# Patient Record
Sex: Female | Born: 1935 | Race: Black or African American | Hispanic: No | Smoking: Former smoker
Health system: Southern US, Academic
[De-identification: ages and names within clinical notes are randomized; demographics above are authoritative.]

## PROBLEM LIST (undated history)

## (undated) DIAGNOSIS — E119 Type 2 diabetes mellitus without complications: Secondary | ICD-10-CM

## (undated) DIAGNOSIS — Z973 Presence of spectacles and contact lenses: Secondary | ICD-10-CM

## (undated) DIAGNOSIS — I10 Essential (primary) hypertension: Secondary | ICD-10-CM

## (undated) DIAGNOSIS — E785 Hyperlipidemia, unspecified: Secondary | ICD-10-CM

## (undated) DIAGNOSIS — I2510 Atherosclerotic heart disease of native coronary artery without angina pectoris: Secondary | ICD-10-CM

## (undated) DIAGNOSIS — B1920 Unspecified viral hepatitis C without hepatic coma: Secondary | ICD-10-CM

## (undated) DIAGNOSIS — N189 Chronic kidney disease, unspecified: Secondary | ICD-10-CM

## (undated) DIAGNOSIS — Z972 Presence of dental prosthetic device (complete) (partial): Secondary | ICD-10-CM

## (undated) HISTORY — PX: HX APPENDECTOMY: SHX54

## (undated) HISTORY — DX: Hyperlipidemia, unspecified: E78.5

## (undated) HISTORY — PX: HX HYSTERECTOMY: SHX81

## (undated) HISTORY — DX: Essential (primary) hypertension: I10

## (undated) HISTORY — DX: Type 2 diabetes mellitus without complications: E11.9

## (undated) HISTORY — DX: Unspecified viral hepatitis C without hepatic coma: B19.20

## (undated) HISTORY — DX: Chronic kidney disease, unspecified: N18.9

## (undated) HISTORY — DX: Atherosclerotic heart disease of native coronary artery without angina pectoris: I25.10

---

## 1993-08-20 ENCOUNTER — Other Ambulatory Visit (HOSPITAL_COMMUNITY): Payer: Self-pay | Admitting: Emergency Medicine

## 2014-11-20 HISTORY — PX: CARDIAC CATHETERIZATION: SHX172

## 2019-10-04 IMAGING — MR MRI LUMBAR SPINE WITHOUT CONTRAST
7 series · 43 of 48 positions shown · IV contrast (gadolinium)
Comparison: None available.

EXAM:  MRI LUMBAR SPINE WITHOUT CONTRAST
INDICATION: New onset lower back pain with right lower extremity radiculopathy.
TECHNIQUE: Multiplanar multisequential MRI of the lumbar spine was performed without gadolinium contrast.

[Series 8: T2 · sagittal · 4.0mm · 1.01mm/px · 4 of 13 slices shown (1 of 4)]
[im 1/13]
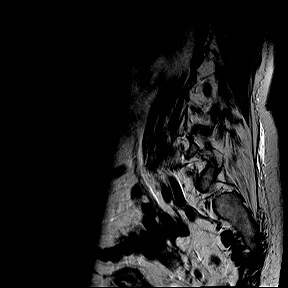
[im 5/13]
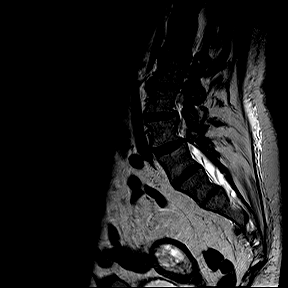
[im 9/13]
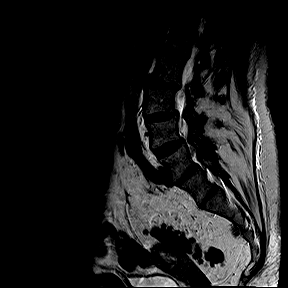
[im 13/13]
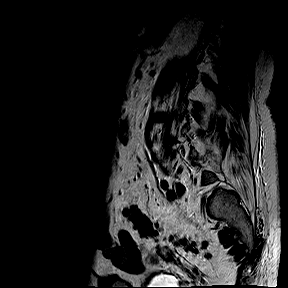

[Series 9: T1 · sagittal · 4.0mm · 1.01mm/px · 5 of 13 slices shown (1 of 2)]
[im 1/13]
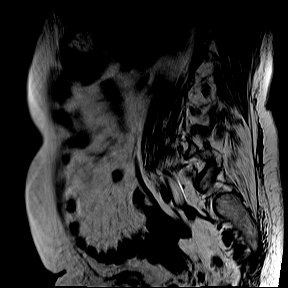
[im 4/13]
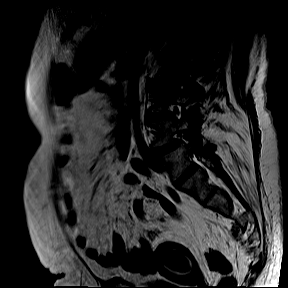
[im 7/13]
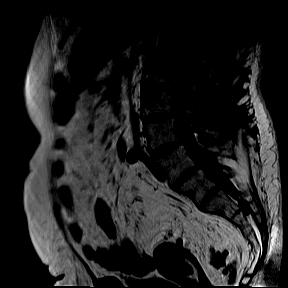
[im 10/13]
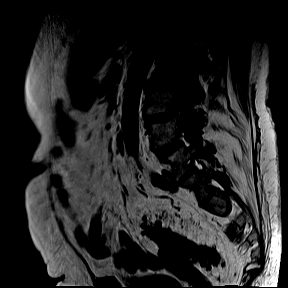
[im 13/13]
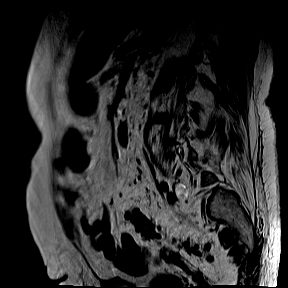

[Series 11: T2 · oblique · 4.0mm · 0.47mm/px · 8 of 26 slices shown (2 of 4)]
[im 1/26]
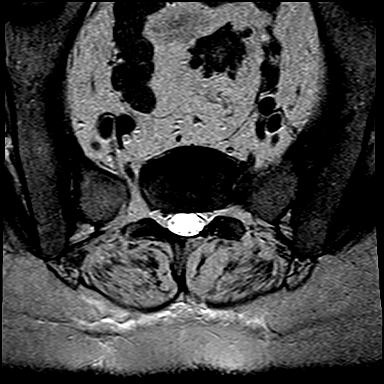
[im 3/26]
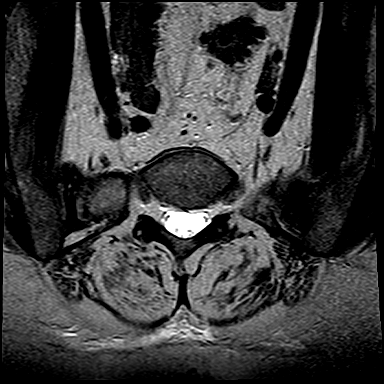
[im 9/26]
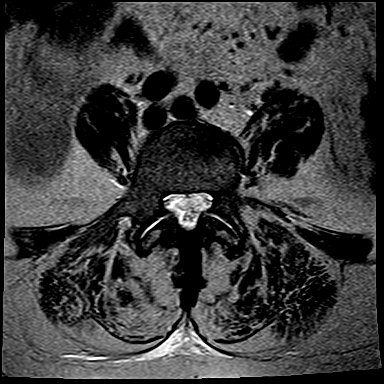
[im 12/26]
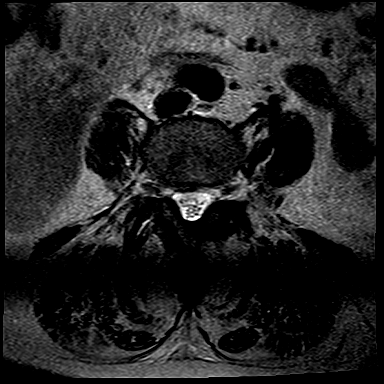
[im 14/26]
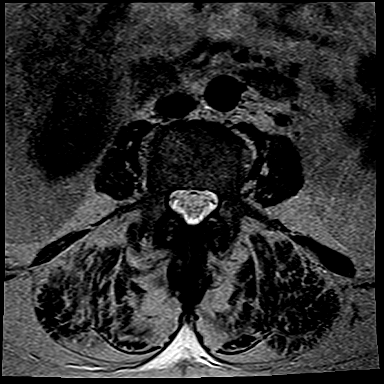
[im 17/26]
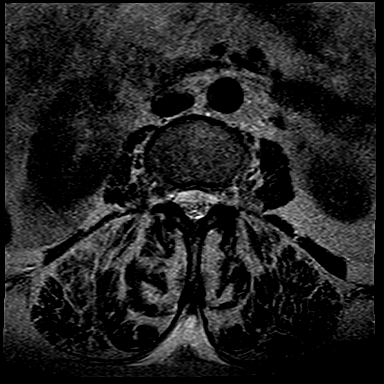
[im 23/26]
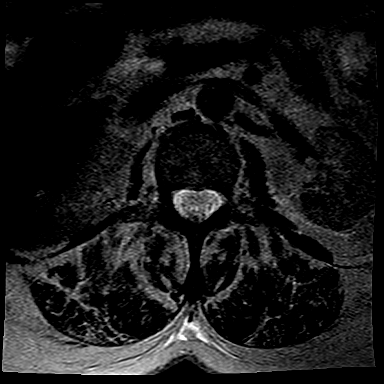
[im 26/26]
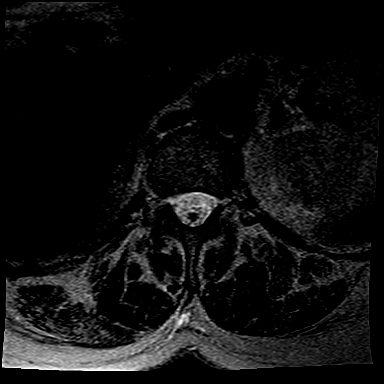

[Series 12: T1 · oblique · 4.0mm · 0.47mm/px · 8 of 26 slices shown (2 of 2)]
[im 1/26]
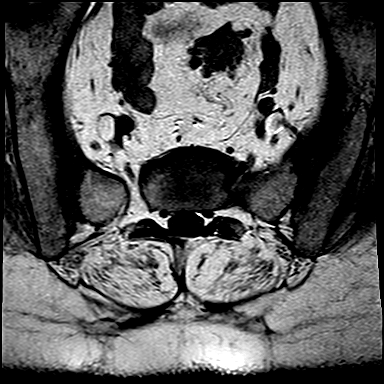
[im 3/26]
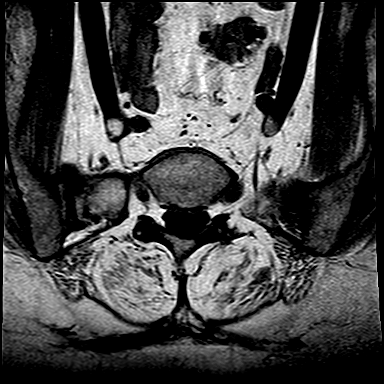
[im 9/26]
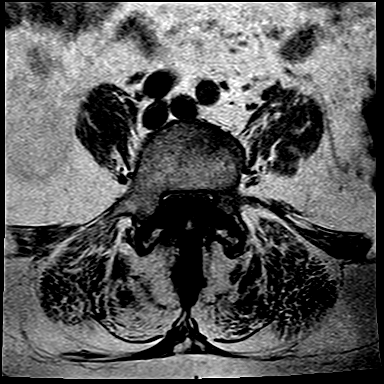
[im 12/26]
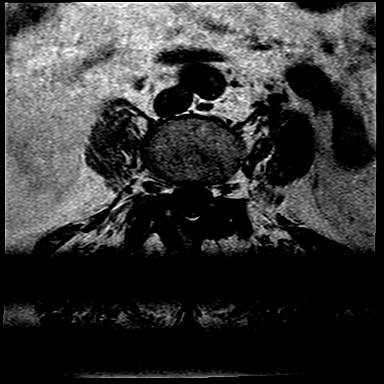
[im 14/26]
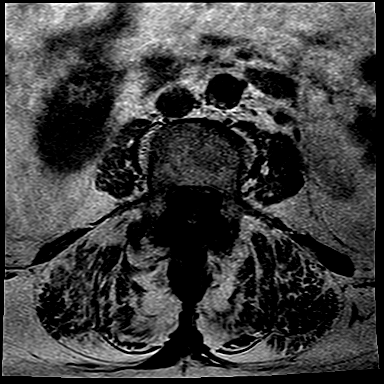
[im 17/26]
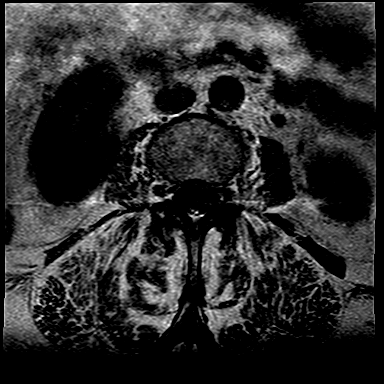
[im 23/26]
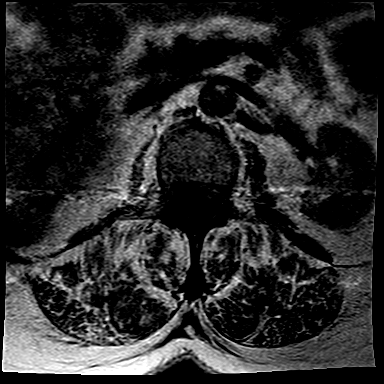
[im 26/26]
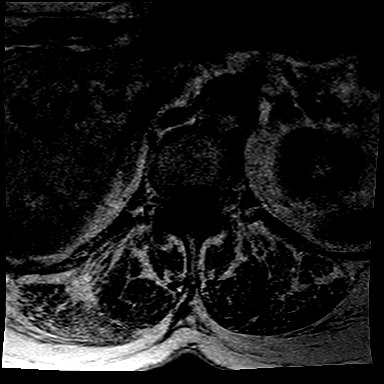

[Series 13: STIR · sagittal · 4.0mm · 1.13mm/px · 4 of 13 slices shown]
[im 1/13]
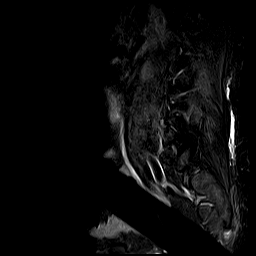
[im 4/13]
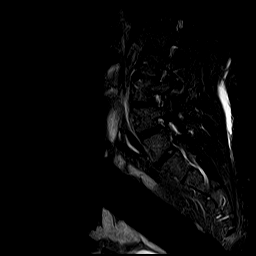
[im 7/13]
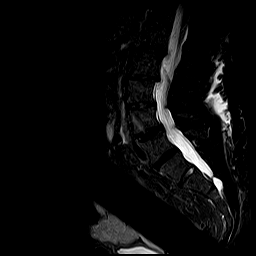
[im 10/13]
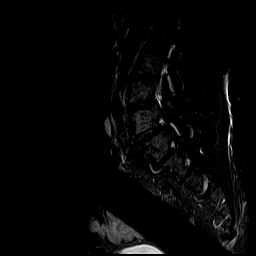

[Series 14: T2 · coronal · 4.0mm · 1.18mm/px · 7 of 20 slices shown (3 of 4)]
[im 1/20]
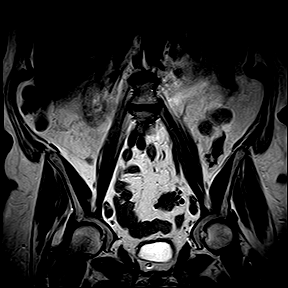
[im 4/20]
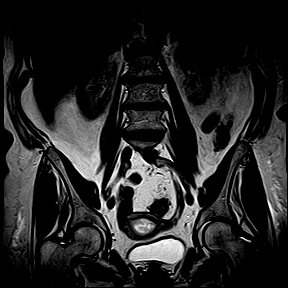
[im 7/20]
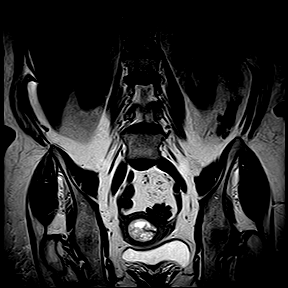
[im 10/20]
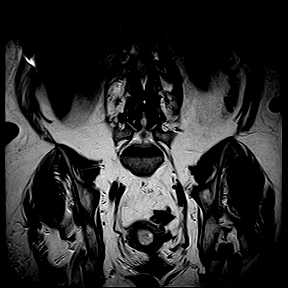
[im 13/20]
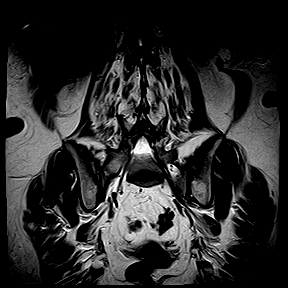
[im 16/20]
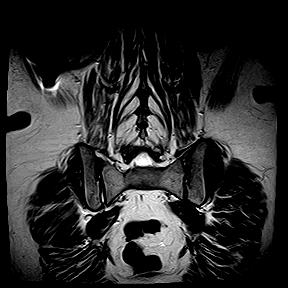
[im 20/20]
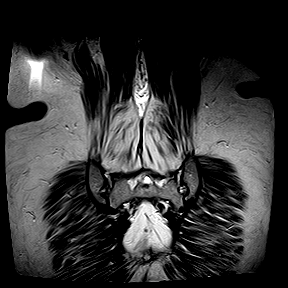

[Series 15: T2 · oblique · 4.0mm · 0.87mm/px · 7 of 20 slices shown (4 of 4)]
[im 1/20]
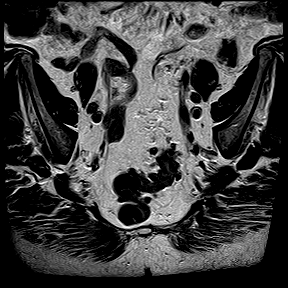
[im 4/20]
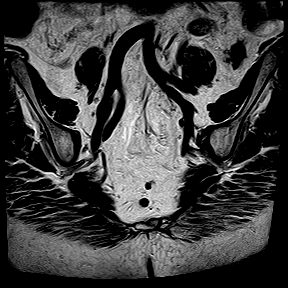
[im 7/20]
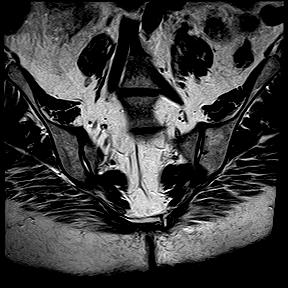
[im 10/20]
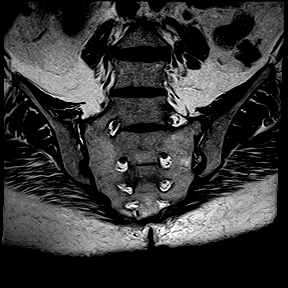
[im 13/20]
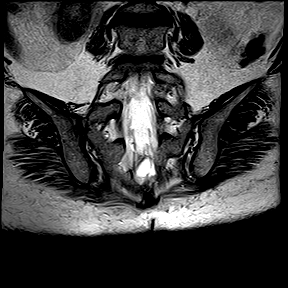
[im 16/20]
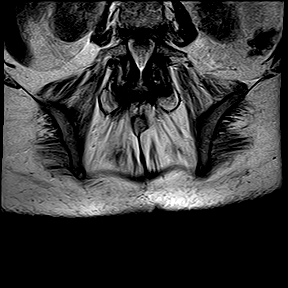
[im 20/20]
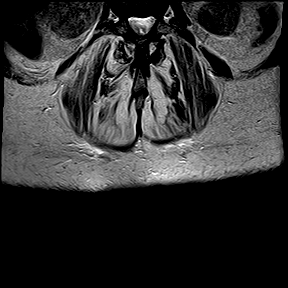

[43 of 48 positions shown; findings below may reference images not displayed]

FINDINGS: There is a transitional lumbosacral junction and the last complete intervertebral disc has been labeled S1-S2 for the purpose of this exam. Bone marrow signal intensity is normal. There is no acute fracture or subluxation. Distal spinal cord is normal in signal intensity and terminates normally at L1-2 disc space level. Spinal canal is congenitally narrow.

At L1-2 level, there is minimal retrolisthesis of L1 on L2 vertebral body. There is a small broad-based central and right paracentral disc bulge, mildly effacing the ventral thecal sac. There is mild right neural foraminal stenosis from facet arthropathy and bulging annulus.

At L2-3 level, there is minimal retrolisthesis of L2 on L3 vertebral body. There is a small broad-based central disc bulge, mildly effacing the ventral thecal sac. There is mild left and moderate right neural foraminal stenosis from facet arthropathy and bulging annulus.

At L3-4 level, there is minimal anterolisthesis of L3 on L4 vertebral body. There is a small broad-based central disc bulge resulting in mild-to-moderate spinal stenosis. There is mild-to-moderate bilateral neural foraminal stenosis from facet arthropathy and bulging annulus.

At L4-5 level, there is minimal anterolisthesis of L4 on L5 vertebral body. There is a small broad-based central disc bulge, mildly effacing the ventral thecal sac. There is mild left and mild-to-moderate right neural foraminal stenosis from facet arthropathy and bulging annulus.

At L5-S1 level, there is mild-to-moderate bilateral neural foraminal stenosis from facet arthropathy.

S1-2 level and paraspinal soft tissues are unremarkable.
IMPRESSION: Transitional lumbosacral junction and the last complete intervertebral disc has been labeled S1-S2 for the purpose of this exam. 

Minimal retrolisthesis of L1 on L2 and L2 on L3 vertebral body. There is also minimal anterolisthesis of L3 on L4 and L4 on L5 vertebral body. 

Small disc bulges at multiple levels with mild-to-moderate spinal stenosis at L3-4 level. 

Multilevel neural foraminal stenosis as detailed above.

## 2019-10-04 IMAGING — US US PELVIC COMPLETE
1 series · 14 of 25 positions shown · non-contrast
Comparison: None available.

EXAM:  US PELVIC COMPLETE
INDICATION: Follow-up endometrial thickening.

[Series 5: us pelvic complete · 0.33mm/px · 14 of 70 slices shown]
[im 1/70]
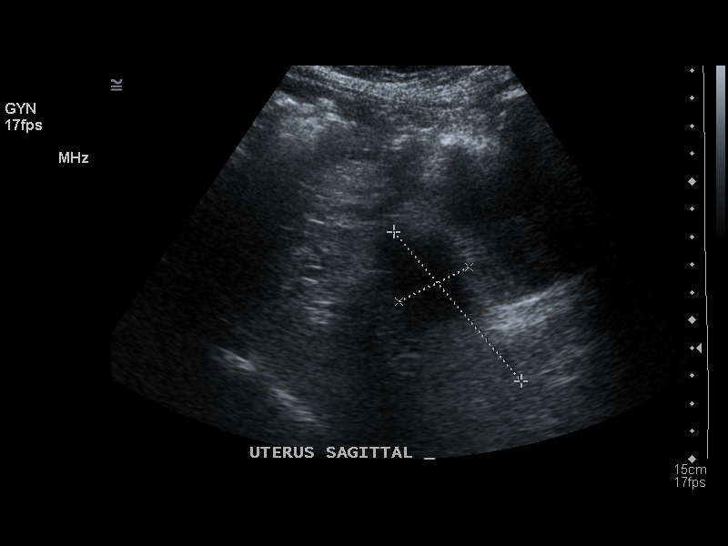
[im 6/70]
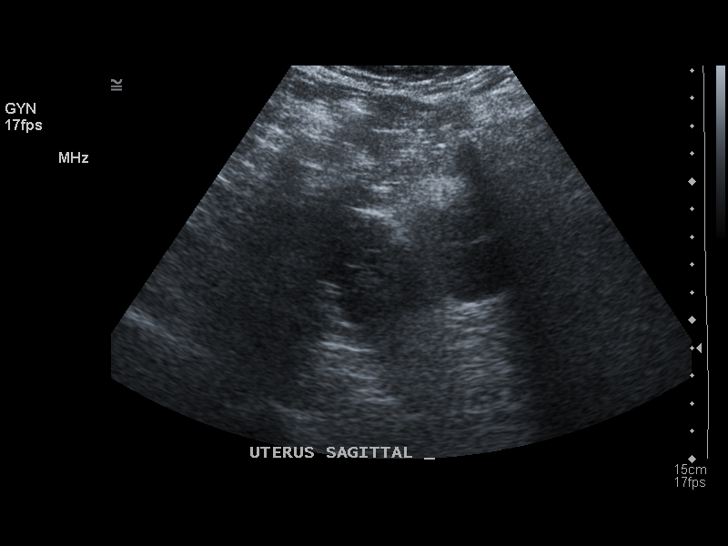
[im 12/70]
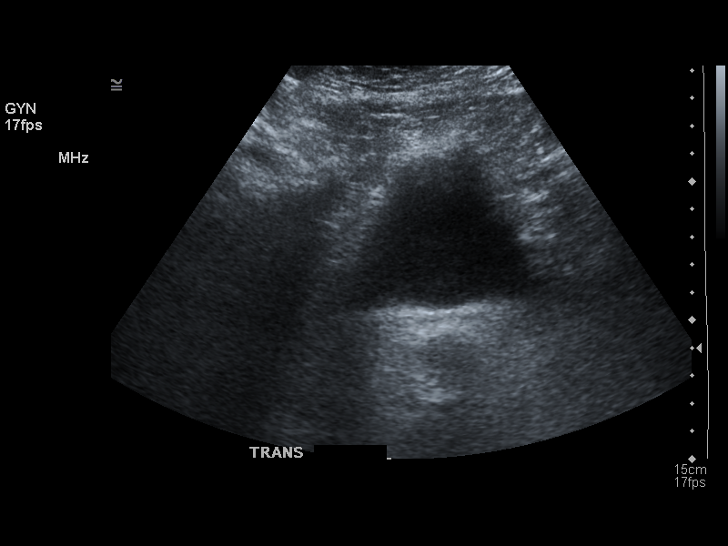
[im 18/70]
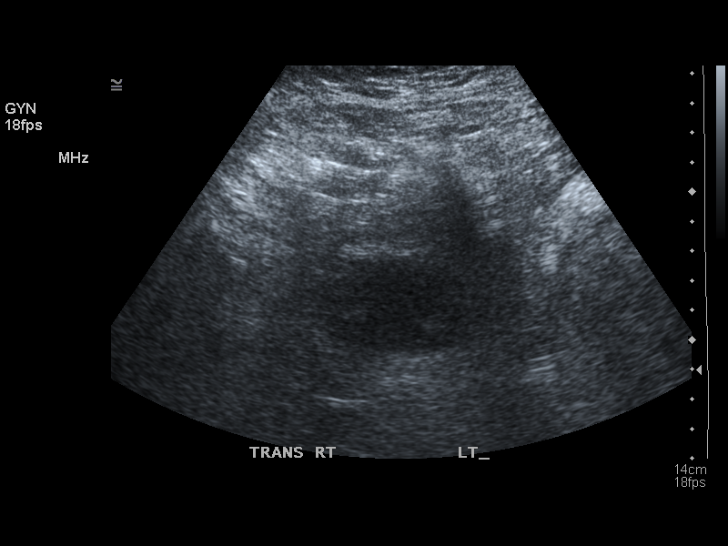
[im 24/70]
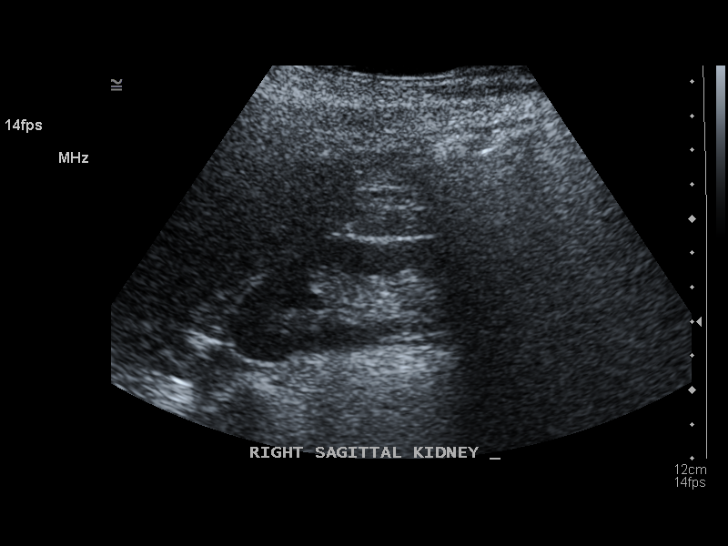
[im 26/70]
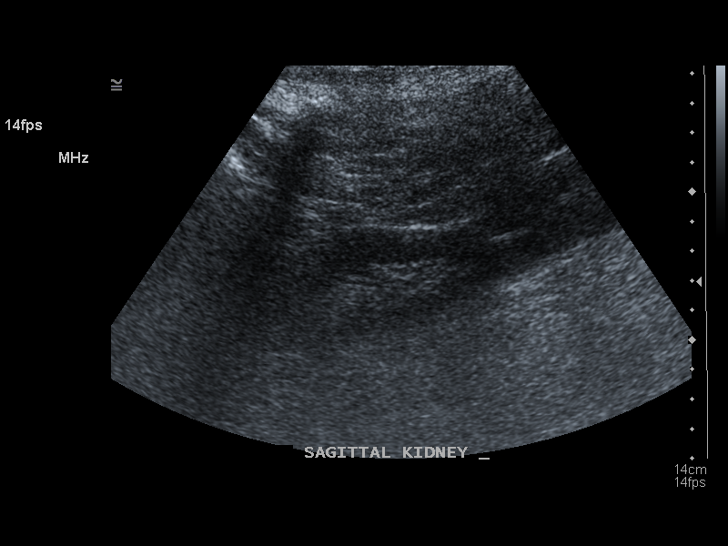
[im 32/70]
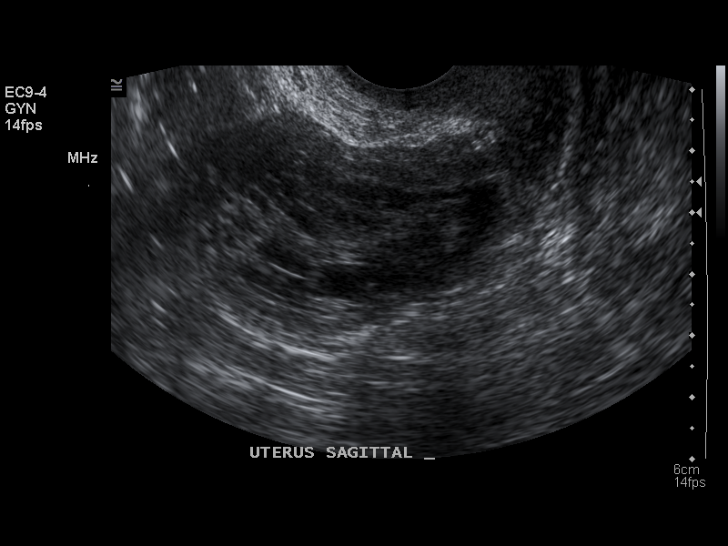
[im 38/70]
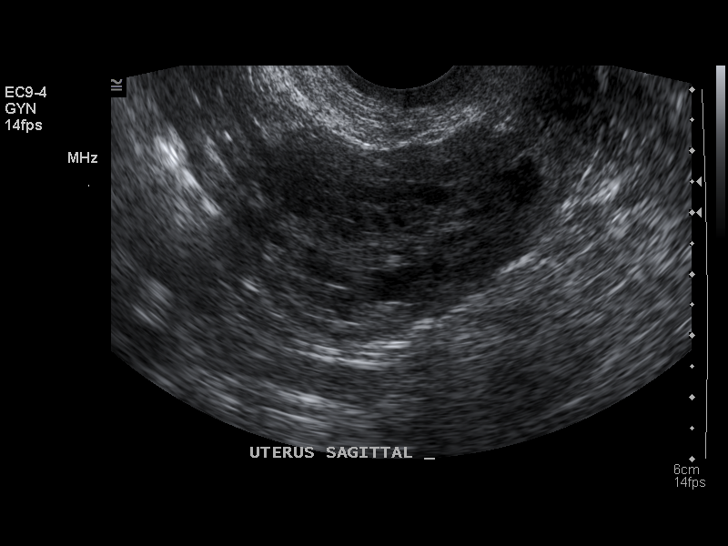
[im 44/70]
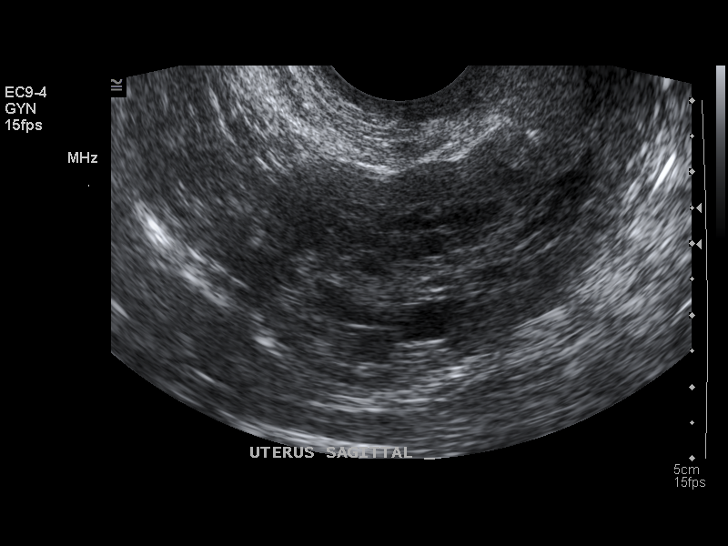
[im 47/70]
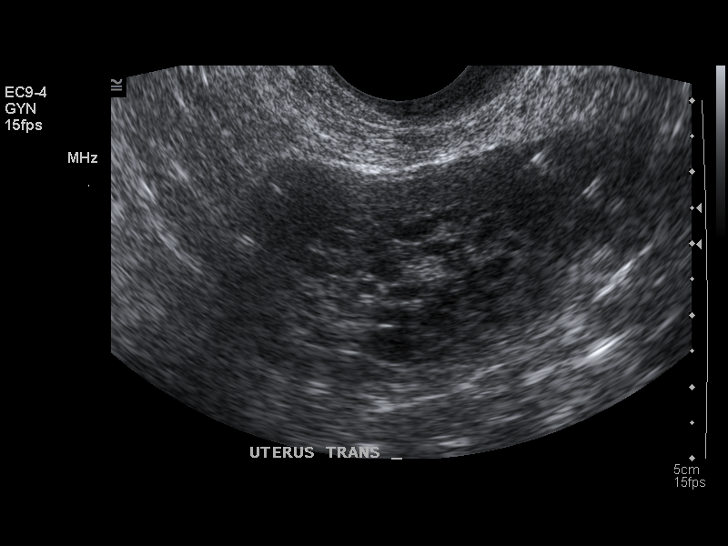
[im 52/70]
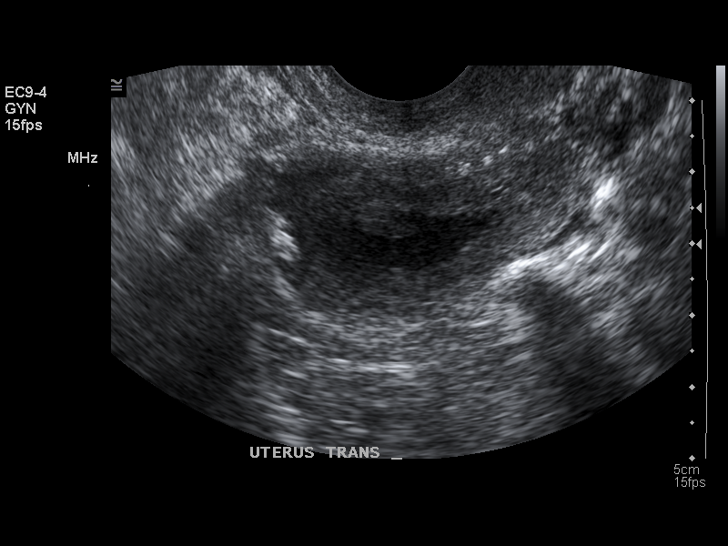
[im 58/70]
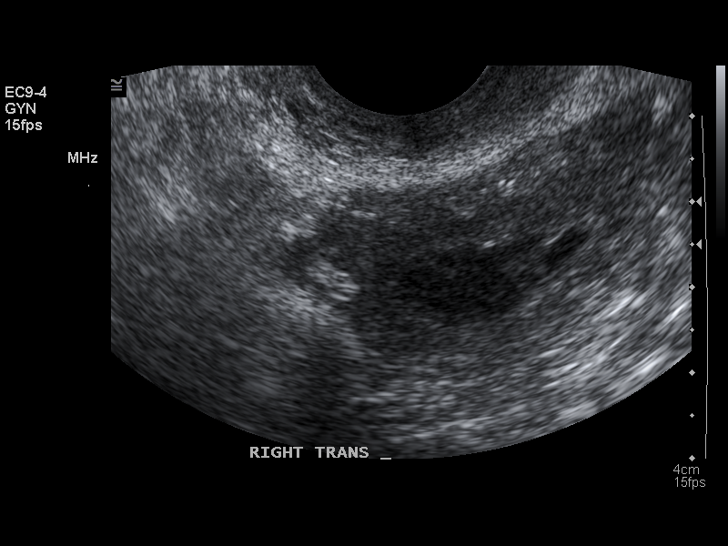
[im 64/70]
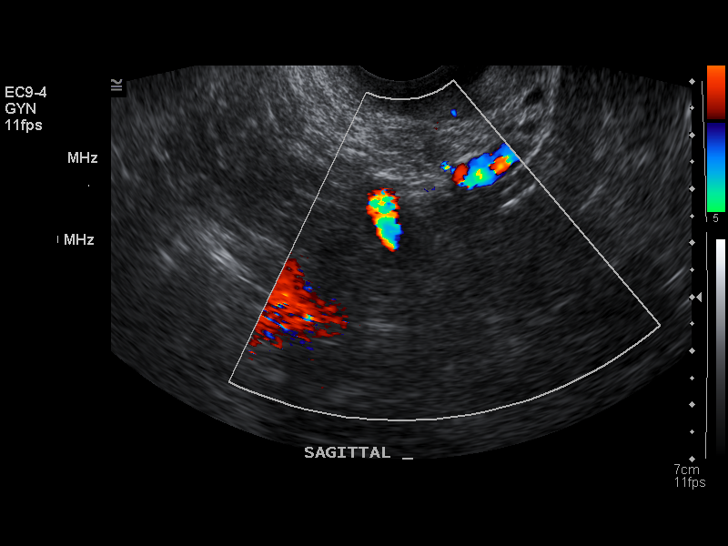
[im 70/70]
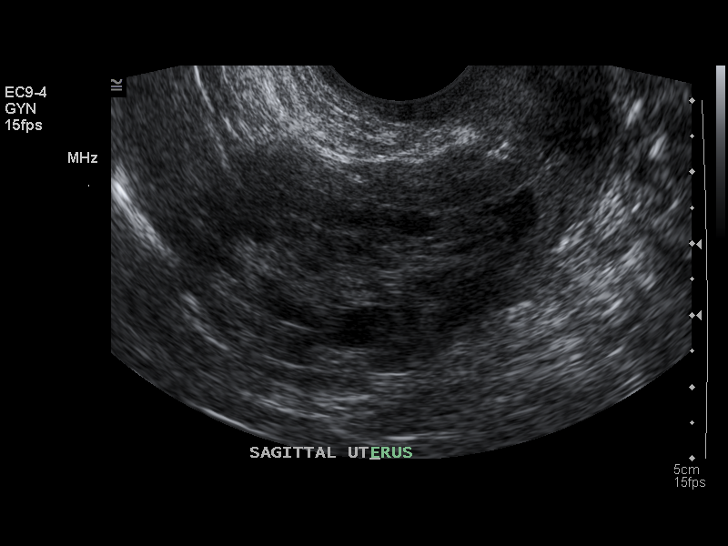

[14 of 25 positions shown; findings below may reference images not displayed]

FINDINGS: Sonographic evaluation the pelvis was performed transabdominally through a partially distended urinary bladder and transvaginally.

Uterus is heterogeneous in echogenicity and measures 6.5 x 3.4 x 4.5 cm. There is significant abnormal thickening of the endometrium with indistinct margins and underlying cystic changes, measuring 20 mm.

Ovaries are not well seen. There is no pelvic free fluid or adnexal mass.
IMPRESSION: Significantly abnormal endometrium as detailed above. This is highly suspicious for malignancy and follow-up GYN consult for tissue sampling is recommended.

## 2020-02-22 IMAGING — CR XRAY HAND 2 VIEWS RT
1 series · 3 of 3 positions shown · non-contrast
Comparison: None.

﻿EXAM:  XRAY HAND 2 VIEWS RIGHT

EXAM:  XRAY WRIST 2 VIEWS RIGHT
INDICATION: 84-year-old female with swelling of right hand and wrist.  No trauma.

[Series 1: view not recorded · 0.17mm/px · 3 of 3 slices shown]
[im 1/3]
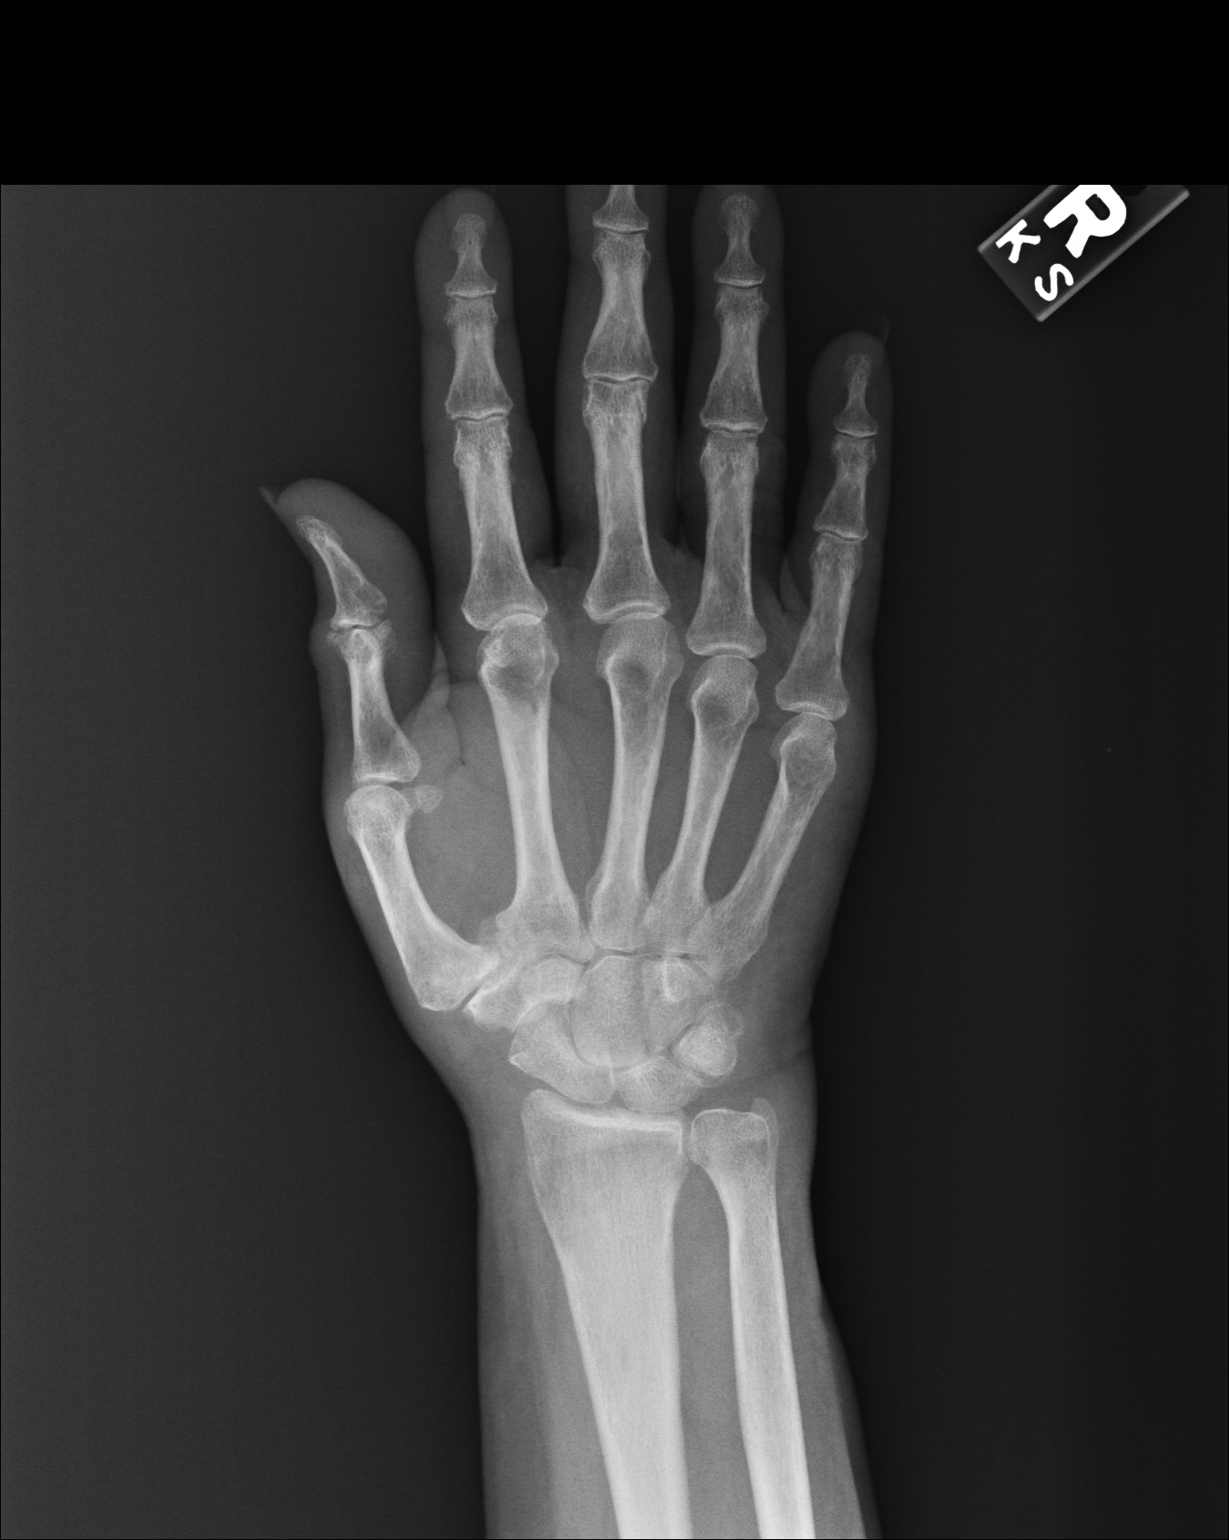
[im 2/3]
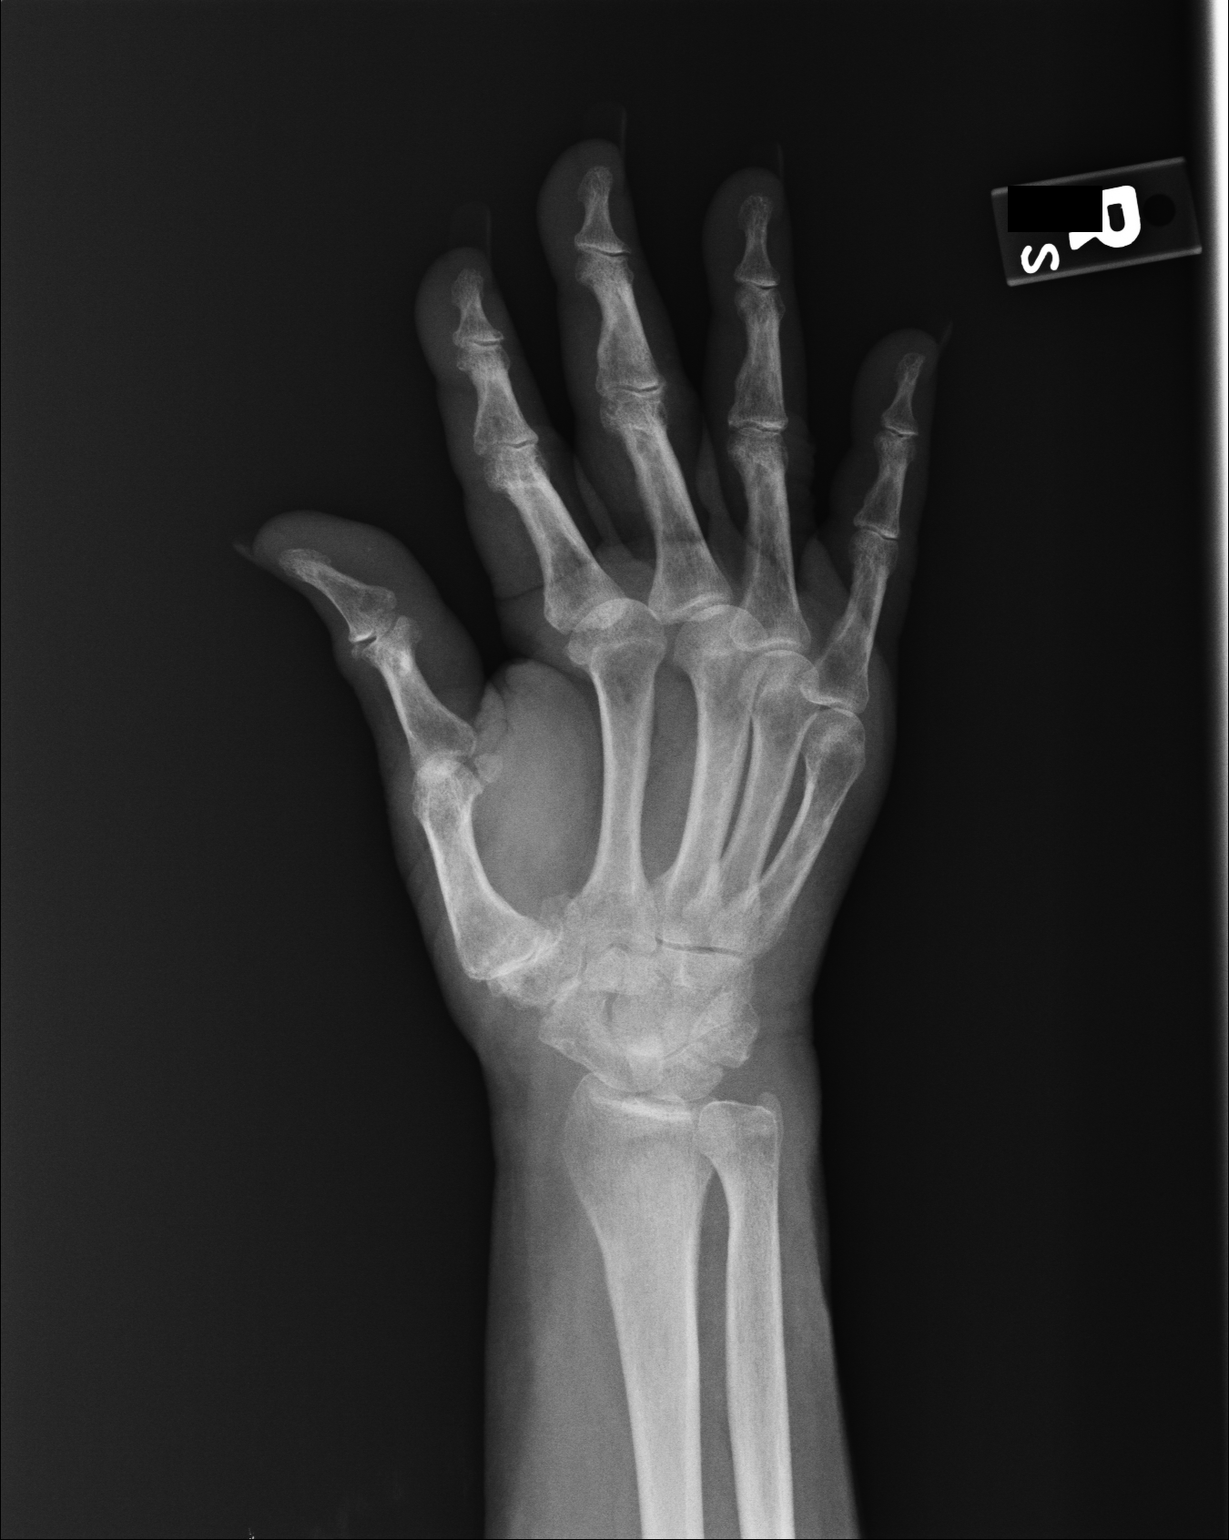
[im 3/3]
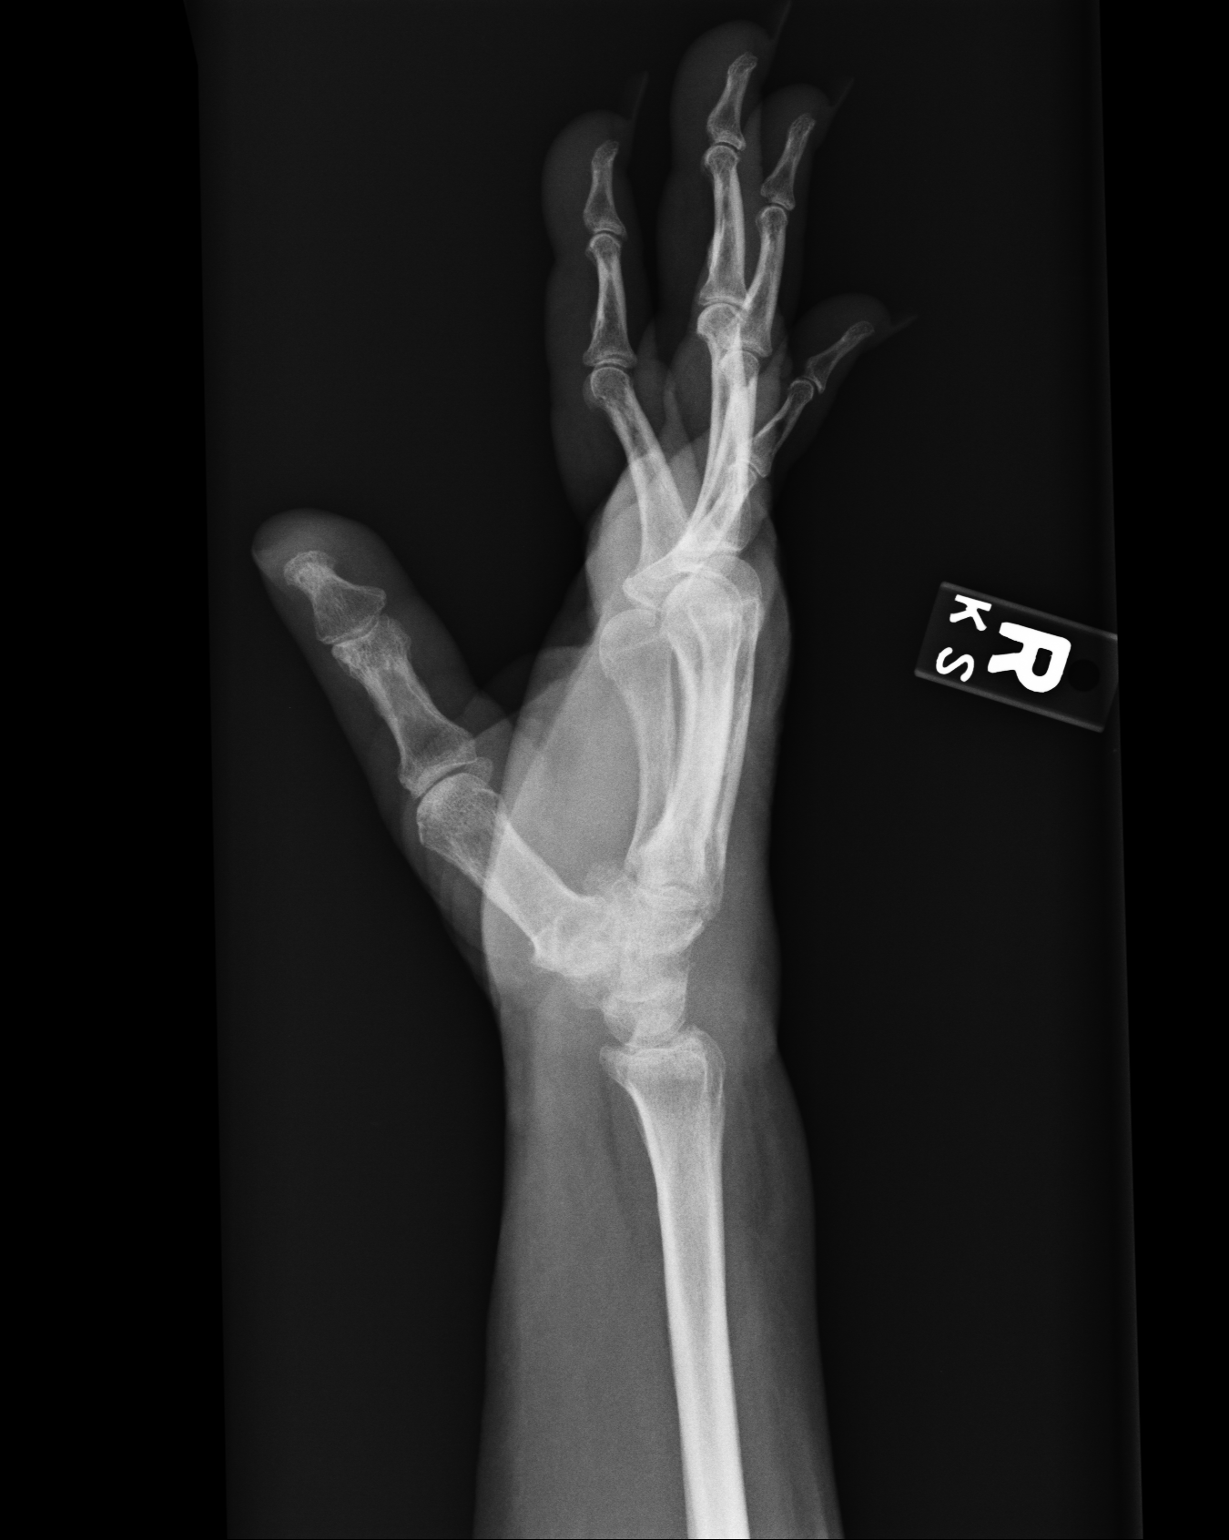

[3 of 3 positions shown; findings below may reference images not displayed]

FINDINGS: Bone density is normal.  No erosive changes at the radiocarpal joint and intercarpal joints.  Moderate degenerative changes at the first carpometacarpal joint. 

Metacarpophalangeal joints are normal.  Mild osteoarthritis of interphalangeal joints.  Soft tissue swelling noted at the wrist.
IMPRESSION: 1. No specific radiographic findings of rheumatoid arthritis.  Normal wrist joint except for soft tissue swelling on the dorsum of the wrist.

2. Osteoarthritis of interphalangeal joints and degenerative changes of the first carpometacarpal joint.

## 2021-08-12 IMAGING — MR MRI CERVICAL SPINE WITHOUT CONTRAST
4 of 5 series · 23 of 48 positions shown · IV contrast (gadolinium)
Comparison: None available.

﻿EXAM:  [id number]   MRI CERVICAL SPINE WITHOUT CONTRAST
INDICATION: Right-sided neck pain.
TECHNIQUE: Multiplanar multisequential MRI of the cervical spine was performed without gadolinium contrast.

[Series 5: T2 · sagittal · 3.0mm · 0.75mm/px · 8 of 15 slices shown (1 of 2)]
[im 1/15]
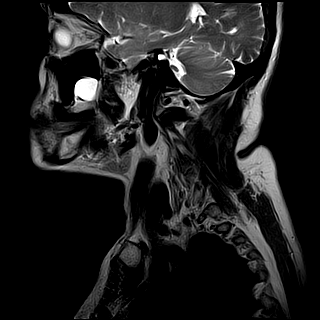
[im 3/15]
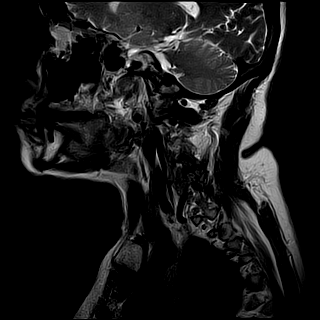
[im 5/15]
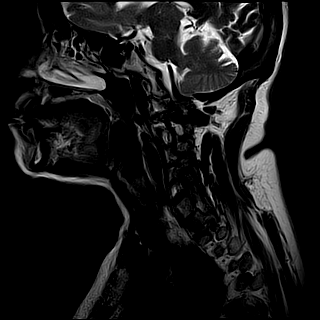
[im 7/15]
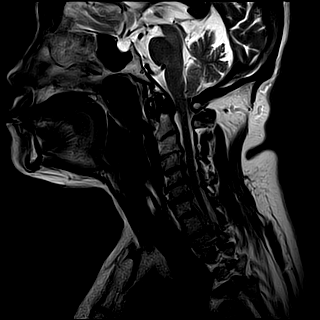
[im 9/15]
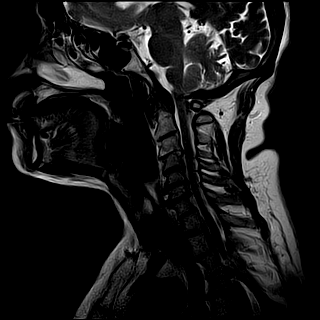
[im 11/15]
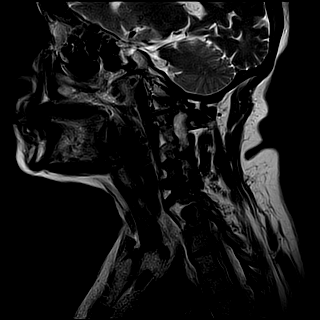
[im 13/15]
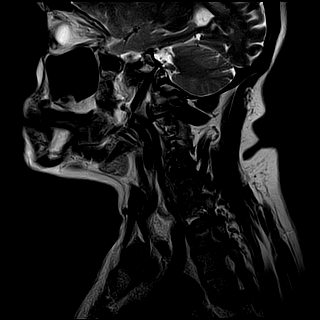
[im 15/15]
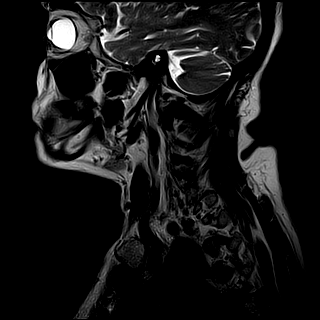

[Series 6: T1 · sagittal · 3.0mm · 0.47mm/px · 3 of 15 slices shown]
[im 2/15]
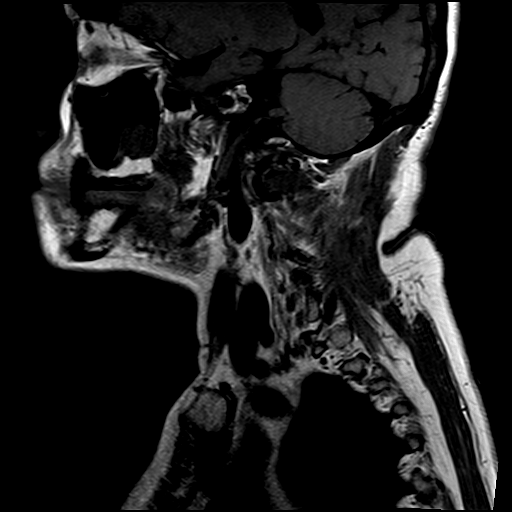
[im 8/15]
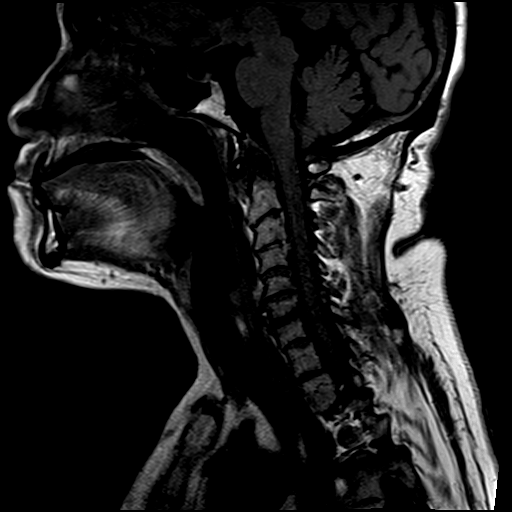
[im 13/15]
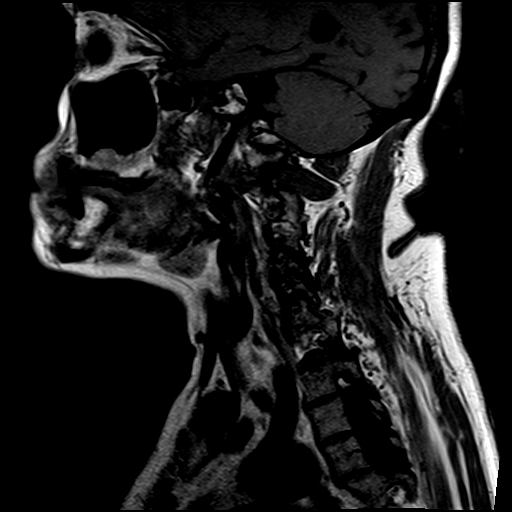

[Series 7: STIR · sagittal · 3.0mm · 0.47mm/px · 3 of 15 slices shown]
[im 2/15]
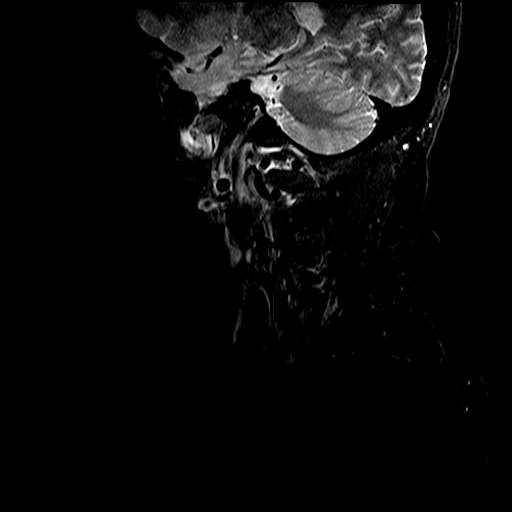
[im 8/15]
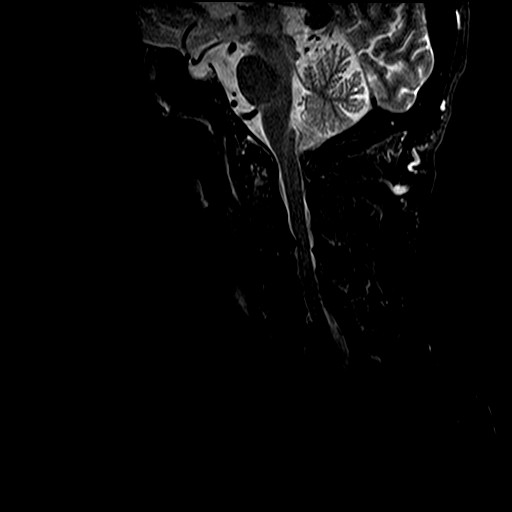
[im 13/15]
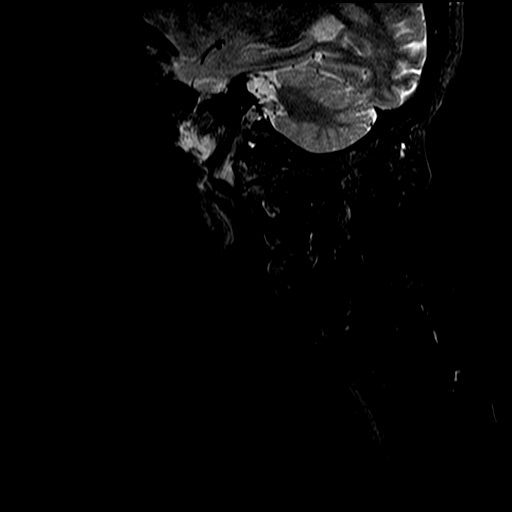

[Series 9: T2 · axial · 3.0mm · 0.39mm/px · z∈[-83,-29]mm · 9 of 18 slices shown (2 of 2)]
[im 1/18]
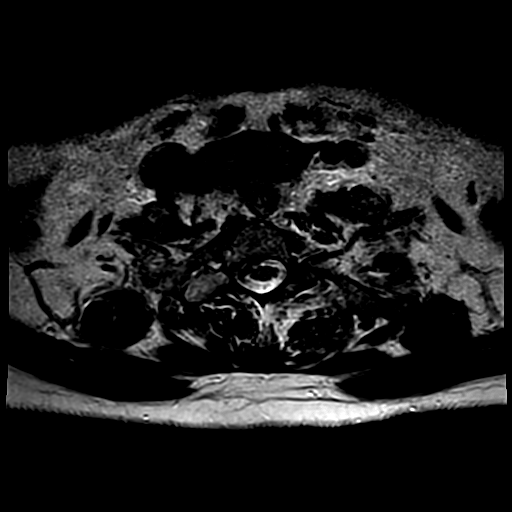
[im 2/18]
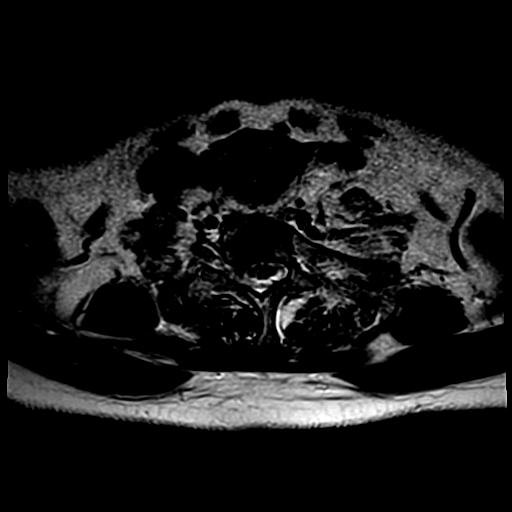
[im 4/18]
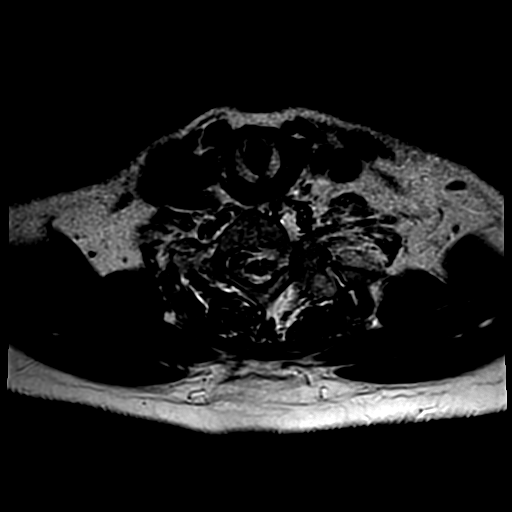
[im 6/18]
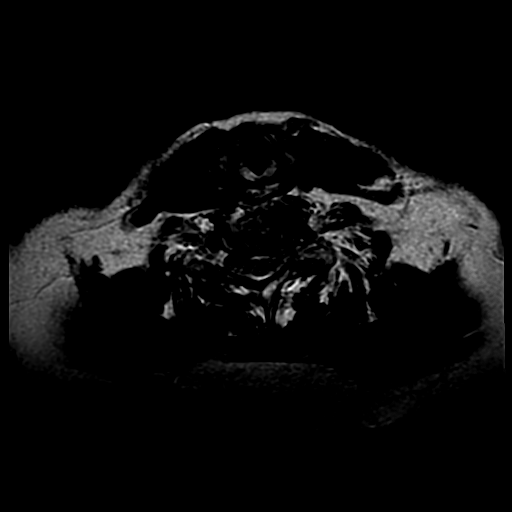
[im 7/18]
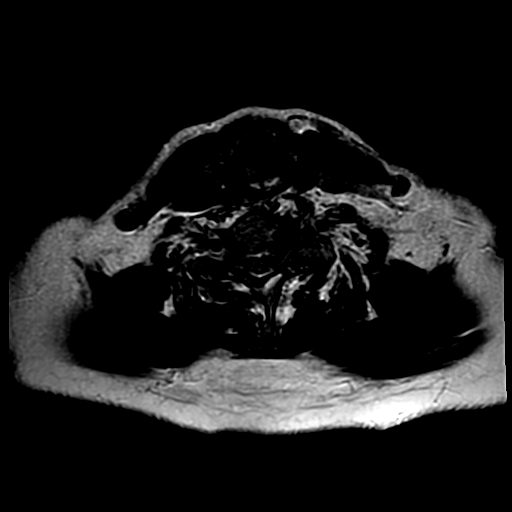
[im 9/18]
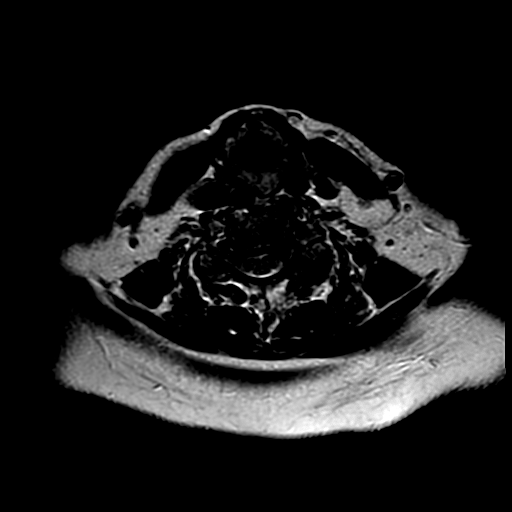
[im 11/18]
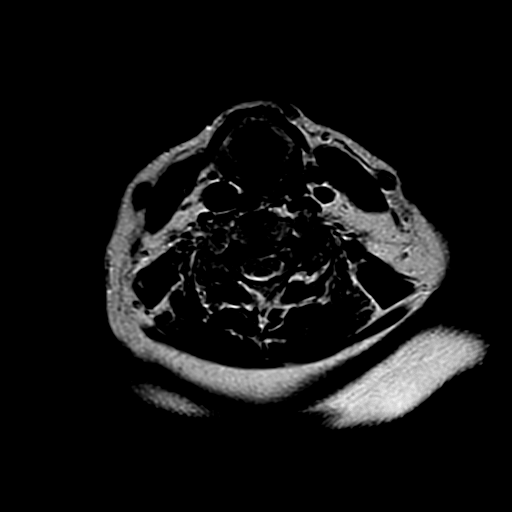
[im 12/18]
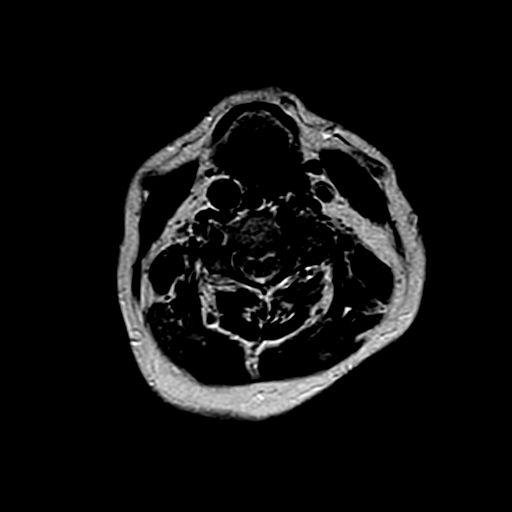
[im 16/18]
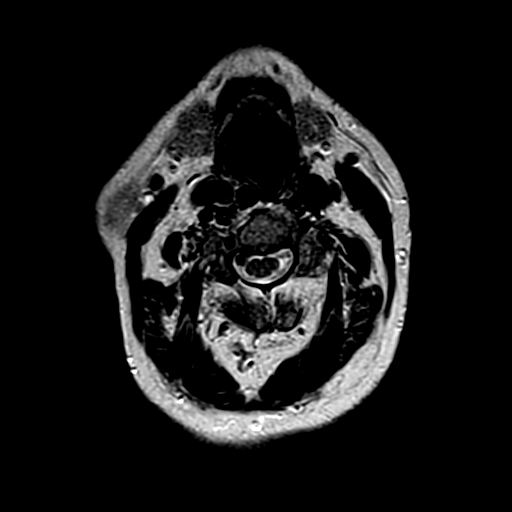

[23 of 48 positions shown; findings below may reference images not displayed]

FINDINGS: Bone marrow signal intensity is normal. There is no acute fracture or subluxation. Visualized spinal cord is also normal in signal intensity without evidence of compression at any level. Moderate to marked disc desiccation is seen at all levels within the cervical spine.

C2-3 level is unremarkable.

At C3-4 level, there is a small broad-based central disc osteophyte complex minimally indenting the ventral cord. There is mild-to-moderate left neural foraminal stenosis from facet and uncovertebral joint hypertrophy.

At C4-5 level, there is a small broad-based central disc bulge mildly effacing the ventral CSF. There is mild bilateral neural foraminal stenosis from facet and uncovertebral joint hypertrophy.

At C5-6 level, there is a small broad-based central disc osteophyte complex with near complete effacement of the ventral CSF. There is moderate left and mild right neural foraminal stenosis from facet and uncovertebral joint hypertrophy.

At C6-7 level, there is a small broad-based central disc osteophyte complex with near complete effacement of the ventral CSF. There is mild-to-moderate left neural foraminal stenosis from uncovertebral joint hypertrophy.

At C7-T1 level, there is minimal anterolisthesis of C7 on T1 vertebral body. There is no significant disc herniation or neural foraminal stenosis.

Paraspinal soft tissues are unremarkable.
IMPRESSION: 1. Minimal anterolisthesis of C7 on T1 vertebral body. 

2. Small disc osteophyte complexes at most levels with minimal indentation of the ventral cord at C3-4 level. 

3. Multilevel neural foraminal stenosis as detailed above.

## 2021-10-28 ENCOUNTER — Other Ambulatory Visit: Payer: Commercial Managed Care - PPO

## 2021-10-28 ENCOUNTER — Other Ambulatory Visit: Payer: Self-pay

## 2021-10-28 DIAGNOSIS — E119 Type 2 diabetes mellitus without complications: Secondary | ICD-10-CM | POA: Insufficient documentation

## 2021-10-28 DIAGNOSIS — I10 Essential (primary) hypertension: Secondary | ICD-10-CM | POA: Insufficient documentation

## 2021-10-28 DIAGNOSIS — E039 Hypothyroidism, unspecified: Secondary | ICD-10-CM

## 2021-10-28 LAB — CBC
HCT: 38.4 % (ref 37.0–47.0)
HGB: 13 g/dL (ref 12.5–16.0)
MCH: 29.3 pg (ref 27.0–32.0)
MCHC: 34 g/dL (ref 32.0–36.0)
MCV: 86.1 fL (ref 78.0–99.0)
MPV: 10.1 fL (ref 7.4–10.4)
PLATELETS: 195 10*3/uL (ref 140–440)
RBC: 4.46 10*6/uL (ref 4.20–5.40)
RDW: 14.8 % (ref 11.6–14.8)
WBC: 6.3 10*3/uL (ref 4.0–10.5)
WBCS UNCORRECTED: 6.3 10*3/uL

## 2021-10-28 LAB — URINALYSIS, MACROSCOPIC
BILIRUBIN: NEGATIVE mg/dL
GLUCOSE: NEGATIVE mg/dL
KETONES: NEGATIVE mg/dL
LEUKOCYTES: NEGATIVE WBCs/uL
NITRITE: NEGATIVE
PH: 5 (ref 5.0–9.0)
PROTEIN: NEGATIVE mg/dL
SPECIFIC GRAVITY: <1.005 (ref 1.002–1.030)
UROBILINOGEN: NORMAL mg/dL

## 2021-10-28 LAB — URINALYSIS, MICROSCOPIC
HYALINE CASTS: 16 /lpf — ABNORMAL HIGH (ref <0)
RBCS: 4 /hpf — ABNORMAL HIGH (ref <4)
SQUAMOUS EPITHELIAL: 6 /hpf (ref <28)
WBCS: 3 /hpf (ref <6)

## 2021-10-28 LAB — COMPREHENSIVE METABOLIC PANEL, NON-FASTING
ALBUMIN/GLOBULIN RATIO: 1.1 (ref 0.8–1.4)
ALBUMIN: 3.9 g/dL (ref 3.5–5.7)
ALKALINE PHOSPHATASE: 79 U/L (ref 34–104)
ALT (SGPT): 10 U/L (ref 7–52)
ANION GAP: 7 mmol/L — ABNORMAL LOW (ref 10–20)
AST (SGOT): 15 U/L (ref 13–39)
BILIRUBIN TOTAL: 0.5 mg/dL (ref 0.3–1.2)
BUN/CREA RATIO: 13 (ref 6–22)
BUN: 20 mg/dL (ref 7–25)
CALCIUM, CORRECTED: 8.8 mg/dL — ABNORMAL LOW (ref 8.9–10.8)
CALCIUM: 8.7 mg/dL (ref 8.6–10.3)
CHLORIDE: 106 mmol/L (ref 98–107)
CO2 TOTAL: 30 mmol/L (ref 21–31)
CREATININE: 1.56 mg/dL — ABNORMAL HIGH (ref 0.60–1.30)
ESTIMATED GFR: 33 mL/min/{1.73_m2} — ABNORMAL LOW (ref >59)
GLOBULIN: 3.5 (ref 2.9–5.4)
GLUCOSE: 172 mg/dL — ABNORMAL HIGH (ref 74–109)
OSMOLALITY, CALCULATED: 292 mOsm/kg — ABNORMAL HIGH (ref 270–290)
POTASSIUM: 3.9 mmol/L (ref 3.5–5.1)
PROTEIN TOTAL: 7.4 g/dL (ref 6.4–8.9)
SODIUM: 143 mmol/L (ref 136–145)

## 2021-10-28 LAB — LIPID PANEL
CHOL/HDL RATIO: 2.8
CHOLESTEROL: 150 mg/dL (ref 136–290)
HDL CHOL: 53 mg/dL (ref 23–92)
LDL CALC: 83 mg/dL (ref 0–100)
TRIGLYCERIDES: 71 mg/dL (ref <=150)
VLDL CALC: 14 mg/dL (ref 0–50)

## 2021-10-28 LAB — VITAMIN D 25 TOTAL: VITAMIN D: 46 ng/mL (ref 30–100)

## 2021-10-28 LAB — HGA1C (HEMOGLOBIN A1C WITH EST AVG GLUCOSE): HEMOGLOBIN A1C: 6.8 % — ABNORMAL HIGH (ref 4.0–6.0)

## 2021-10-28 LAB — THYROID STIMULATING HORMONE (SENSITIVE TSH): TSH: 2.545 u[IU]/mL (ref 0.450–5.330)

## 2021-11-01 LAB — URINE CULTURE: URINE CULTURE: 5000 — AB

## 2021-11-05 ENCOUNTER — Other Ambulatory Visit (HOSPITAL_COMMUNITY): Payer: Self-pay

## 2021-11-05 DIAGNOSIS — Z1231 Encounter for screening mammogram for malignant neoplasm of breast: Secondary | ICD-10-CM

## 2021-11-16 ENCOUNTER — Encounter (INDEPENDENT_AMBULATORY_CARE_PROVIDER_SITE_OTHER): Payer: Self-pay | Admitting: INTERVENTIONAL CARDIOLOGY

## 2021-11-19 ENCOUNTER — Other Ambulatory Visit: Payer: Self-pay

## 2021-11-19 ENCOUNTER — Encounter (INDEPENDENT_AMBULATORY_CARE_PROVIDER_SITE_OTHER): Payer: Self-pay | Admitting: INTERVENTIONAL CARDIOLOGY

## 2021-11-19 ENCOUNTER — Ambulatory Visit (INDEPENDENT_AMBULATORY_CARE_PROVIDER_SITE_OTHER): Payer: Commercial Managed Care - PPO | Admitting: INTERVENTIONAL CARDIOLOGY

## 2021-11-19 VITALS — BP 160/87 | HR 73 | Ht 63.0 in | Wt 175.5 lb

## 2021-11-19 DIAGNOSIS — E785 Hyperlipidemia, unspecified: Secondary | ICD-10-CM

## 2021-11-19 DIAGNOSIS — R0602 Shortness of breath: Secondary | ICD-10-CM

## 2021-11-19 DIAGNOSIS — I10 Essential (primary) hypertension: Secondary | ICD-10-CM

## 2021-11-19 DIAGNOSIS — I5030 Unspecified diastolic (congestive) heart failure: Secondary | ICD-10-CM

## 2021-11-19 DIAGNOSIS — E119 Type 2 diabetes mellitus without complications: Secondary | ICD-10-CM

## 2021-11-19 DIAGNOSIS — I2510 Atherosclerotic heart disease of native coronary artery without angina pectoris: Secondary | ICD-10-CM

## 2021-11-19 LAB — ECG W INTERP (AMB USE ONLY)(MUSE,IN CLINIC)
Atrial Rate: 65 {beats}/min
Calculated P Axis: 25 degrees
Calculated R Axis: -22 degrees
Calculated T Axis: -68 degrees
PR Interval: 178 ms
QRS Duration: 94 ms
QT Interval: 404 ms
QTC Calculation: 420 ms
Ventricular rate: 65 {beats}/min

## 2021-11-19 MED ORDER — ASPIRIN 81 MG TABLET,DELAYED RELEASE
81.0000 mg | DELAYED_RELEASE_TABLET | Freq: Every day | ORAL | 1 refills | Status: AC
Start: 2021-11-19 — End: 2022-02-17

## 2021-11-19 MED ORDER — ATENOLOL 25 MG TABLET
50.0000 mg | ORAL_TABLET | Freq: Every day | ORAL | 1 refills | Status: DC
Start: 2021-11-19 — End: 2022-11-26

## 2021-11-19 MED ORDER — EMPAGLIFLOZIN 10 MG TABLET
10.0000 mg | ORAL_TABLET | Freq: Every day | ORAL | 5 refills | Status: AC
Start: 2021-11-19 — End: 2021-12-19

## 2021-11-19 MED ORDER — AMLODIPINE 10 MG TABLET
10.0000 mg | ORAL_TABLET | Freq: Every day | ORAL | 1 refills | Status: AC
Start: 2021-11-19 — End: 2024-01-08

## 2021-11-19 MED ORDER — EZETIMIBE 10 MG TABLET
10.0000 mg | ORAL_TABLET | Freq: Every morning | ORAL | 1 refills | Status: AC
Start: 2021-11-19 — End: 2024-01-09

## 2021-11-19 MED ORDER — OLMESARTAN 40 MG TABLET
40.0000 mg | ORAL_TABLET | Freq: Every day | ORAL | 1 refills | Status: DC
Start: 2021-11-19 — End: 2022-07-22

## 2021-11-19 NOTE — Progress Notes (Signed)
Cardiology [facility]    Name: Connie Barber  Age: 87 y.o.  Date of Service: 11/19/2021    Primary Care Provider: Sharman Cheek, MD  Chief Complaint:   Chief Complaint   Patient presents with    Heart Disease       Subjective:   Connie Barber is a very pleasant 87 y.o. female with a past medical history significant for CAD. In Connie 2016 she underwent cardiac catheterization due to abnormal stress test which revealed a small to moderate apical reversible perfusion defect. Cardiac catheterization was then performed and this revealed EF 50% with maybe some slight basal inferior akinesis and some questionable anterior hypokinesis. The right coronary artery was a small nondominant vessel. LAD was a large vessel wrapped around the apex and had 50% mid stenosis. Left circumflex was a dominant vessel appeared to be angiographically normal. The patient has echocardiogram in October 2015, which was unremarkable with normal LV function. No significant valvular disease. Holter recording has been okay. Lipids have been well controlled. Despite this, was started on low dose statin due to her moderate CAD. Repeat echocardiogram in December 2016 revealed EF of 55 to 60%. Normal study.  repeat echocardiogram on 09-17-20 which showed normal LV function, EF 60%.  There is moderate diastolic dysfunction.  Mild LVH and mild left atrial enlargement.     05-10-21 The patient is here for routine follow-up for CAD.  She denies any chest pains.  She does have some shortness of breath with exertion, but this is unchanged.  She is tolerating medications well.  Blood pressure was elevated today.  She states her blood pressure cuff at home broke so she has not had 1, but she decided that when sent to her.  She states previously her blood pressure usually stays in the 025E systolic.  She did have labs last month which looked really good.  Her A1c had improved to 6.5, total cholesterol 135, triglycerides 52, HDL 44,  LDL 81, BUN 27, creatinine 1.5, sodium 143, potassium 4.2.    11/19/2021: The patient is here for routine follow up for CAD.  She denies any chest pain or palpitations.  She does report she has shortness of breath chronically if common stairs otherwise denies any PND or orthopnea.  She does have bilateral lower extremity edema that she says comes and goes in regards to worsening.  She is diabeti,  last known A1c was 6.5.  The blood pressure is elevated today however home readings have been 130s to 140s most of the time.  She had recent labs with her primary care however I do not have a copy of those reports at this time.    Past Medical History:  Past Medical History:   Diagnosis Date    CKD (chronic kidney disease)     Stage 3b    Coronary artery disease     Diabetes mellitus (CMS HCC)     Dyslipidemia     Essential hypertension     Hepatitis C          Social History:  Social History     Tobacco Use   Smoking Status Never   Smokeless Tobacco Never      Social History     Substance and Sexual Activity   Alcohol Use Never      Social History     Substance and Sexual Activity   Drug Use Never      Current Medications:  Current Outpatient Medications  Medication Sig    amLODIPine (NORVASC) 10 mg Oral Tablet Take 1 Tablet (10 mg total) by mouth Once a day    aspirin (ECOTRIN) 81 mg Oral Tablet, Delayed Release (E.C.) Take 1 Tablet (81 mg total) by mouth Once a day    atenoloL (TENORMIN) 25 mg Oral Tablet Take 2 Tablets (50 mg total) by mouth Once a day    Colchicine-Probenecid 500-0.5 mg Oral Tablet Take 1 Tablet by mouth Twice daily with food    empagliflozin (JARDIANCE) 10 mg Oral Tablet Take 1 Tablet (10 mg total) by mouth Once a day for 30 days    ergocalciferol, vitamin D2, (DRISDOL) 1,250 mcg (50,000 unit) Oral Capsule Take 1 Capsule (50,000 Units total) by mouth Every 7 days    ezetimibe (ZETIA) 10 mg Oral Tablet Take 1 Tablet (10 mg total) by mouth Every morning    glimepiride (AMARYL) 2 mg Oral Tablet Take 1  Tablet (2 mg total) by mouth Every morning with breakfast    levothyroxine (SYNTHROID) 75 mcg Oral Tablet Take 1 Tablet (75 mcg total) by mouth Every morning    olmesartan (BENICAR) 40 mg Oral Tablet Take 1 Tablet (40 mg total) by mouth Once a day    SITagliptin phosphate (JANUVIA) 50 mg Oral Tablet Take 1 Tablet (50 mg total) by mouth Once a day     Allergies:  No Known Allergies   Review of Systems:  Complete ROS was performed and otherwise negative unless noted in HPI.      Vital Signs:  Vitals:    11/19/21 1350   BP: (!) 160/87   Pulse: 73   SpO2: 95%   Weight: 79.6 kg (175 lb 8 oz)   Height: 1.6 m (5\' 3" )   BMI: 31.15      Physical Exam:  General: Pt resting comfortably in no acute distress and appears stated age.    Neck: No JVD, no carotid bruit. Neck supple, symmetrical, trachea midline.   Lungs:  Normal respiratory effort, lungs clear to auscultation bilaterally.    Cardiovascular: Regular rate and rhythm without murmurs rubs or gallops and vascular pulses are 2+ throughout.  Abdomen: Soft, non-tender and bowel sounds normal.    Extremities: Extremities normal, atraumatic, no cyanosis. BLE edema 2+  Neurologic: Alert and oriented x3.     Previous Studies     Last Echocardiogram 2022    Last Cardiac Catheterization 2016    Last Myocardial Perfusion Scan 2016    Assessment: Coronary artery disease, unspecified vessel or lesion type, unspecified whether angina present, unspecified whether native or transplanted heart    Heart failure with preserved ejection fraction, unspecified HF chronicity (CMS HCC)    Essential hypertension    Hyperlipidemia, unspecified hyperlipidemia type    DM (diabetes mellitus) (CMS HCC)    SOB (shortness of breath)     Plan:  The blood pressure is elevated today, however appears to be controlled by home readings.  Goal of less than 130/80 more often than not.  She has extensive lower extremity edema, most likely as a result of  her heart failure.  Advise to start Jardiance 10  milligrams once daily, this will also benefit her diabetes.  Also suggest trial of lower extremity compression stockings placed on in the morning and remove at bedtime if she is able to do so.  Return to clinic in 6 months. Patient seen and examined by myself and Dr. Leonides Schanz.  Assessment and plan discussed and agreed upon as documented.  See Dr. Guido Sander plan for any additional details.       Orders placed this visit:  Orders Placed This Encounter    EKG (In-Clinic Today)    CANCELED: EKG (In-Clinic Today)    empagliflozin (JARDIANCE) 10 mg Oral Tablet       Connie Barber is to return to clinic for follow up with the understanding that should symptoms change or worsen she is to call the office or go to the closest emergency department for evaluation.    Connie Christ, FNP-C    A portion of this documentation may have been generated using MMODAL voice recognition software and may contain syntax/voice recognition errors.    I interviewed and examined the patient together with the mid-level provider.  I formulated the assessment and plan for further management.  I agree with the documentation above.    Ronnald Ramp MD  Interventional Cardiology  11/29/21 11:46

## 2021-11-20 ENCOUNTER — Ambulatory Visit (HOSPITAL_COMMUNITY): Payer: Self-pay

## 2021-11-29 ENCOUNTER — Ambulatory Visit (HOSPITAL_COMMUNITY): Payer: Self-pay

## 2021-12-06 ENCOUNTER — Inpatient Hospital Stay
Admission: RE | Admit: 2021-12-06 | Discharge: 2021-12-06 | Disposition: A | Discharge status: Home / Self Care | Payer: Commercial Managed Care - PPO | Source: Ambulatory Visit

## 2021-12-06 ENCOUNTER — Encounter (HOSPITAL_COMMUNITY): Payer: Self-pay

## 2021-12-06 ENCOUNTER — Other Ambulatory Visit: Payer: Self-pay

## 2021-12-06 DIAGNOSIS — Z1231 Encounter for screening mammogram for malignant neoplasm of breast: Secondary | ICD-10-CM | POA: Insufficient documentation

## 2022-02-10 ENCOUNTER — Other Ambulatory Visit: Payer: Self-pay

## 2022-02-10 ENCOUNTER — Ambulatory Visit (INDEPENDENT_AMBULATORY_CARE_PROVIDER_SITE_OTHER): Payer: Commercial Managed Care - PPO

## 2022-02-10 ENCOUNTER — Other Ambulatory Visit: Payer: Commercial Managed Care - PPO | Attending: Nephrology | Admitting: Nephrology

## 2022-02-10 DIAGNOSIS — E538 Deficiency of other specified B group vitamins: Secondary | ICD-10-CM

## 2022-02-10 DIAGNOSIS — Z131 Encounter for screening for diabetes mellitus: Secondary | ICD-10-CM

## 2022-02-10 DIAGNOSIS — D631 Anemia in chronic kidney disease: Secondary | ICD-10-CM | POA: Insufficient documentation

## 2022-02-10 DIAGNOSIS — N184 Chronic kidney disease, stage 4 (severe): Secondary | ICD-10-CM

## 2022-02-10 DIAGNOSIS — E559 Vitamin D deficiency, unspecified: Secondary | ICD-10-CM

## 2022-02-10 LAB — CBC WITH DIFF
BASOPHIL #: 0.1 10*3/uL (ref 0.00–0.30)
BASOPHIL %: 1 % (ref 0–3)
EOSINOPHIL #: 0.1 10*3/uL (ref 0.00–0.80)
EOSINOPHIL %: 1 % (ref 0–7)
HCT: 38.2 % (ref 37.0–47.0)
HGB: 12.7 g/dL (ref 12.5–16.0)
LYMPHOCYTE #: 2.5 10*3/uL (ref 1.10–5.00)
LYMPHOCYTE %: 40 % (ref 25–45)
MCH: 28.8 pg (ref 27.0–32.0)
MCHC: 33.1 g/dL (ref 32.0–36.0)
MCV: 86.9 fL (ref 78.0–99.0)
MONOCYTE #: 0.6 10*3/uL (ref 0.00–1.30)
MONOCYTE %: 9 % (ref 0–12)
MPV: 10.7 fL — ABNORMAL HIGH (ref 7.4–10.4)
NEUTROPHIL #: 3 10*3/uL (ref 1.80–8.40)
NEUTROPHIL %: 49 % (ref 40–76)
PLATELETS: 190 10*3/uL (ref 140–440)
RBC: 4.4 10*6/uL (ref 4.20–5.40)
RDW: 14.2 % (ref 11.6–14.8)
WBC: 6.2 10*3/uL (ref 4.0–10.5)
WBCS UNCORRECTED: 6.2 10*3/uL

## 2022-02-10 LAB — IRON TRANSFERRIN AND TIBC
IRON (TRANSFERRIN) SATURATION: 24 % (ref 15–50)
IRON: 81 ug/dL (ref 50–212)
TOTAL IRON BINDING CAPACITY: 336 ug/dL (ref 250–450)
TRANSFERRIN: 240 mg/dL (ref 203–362)
UIBC: 255 ug/dL (ref 130–375)

## 2022-02-10 LAB — FOLATE: FOLATE: >24.2 ng/mL (ref 5.9–24.4)

## 2022-02-10 LAB — COMPREHENSIVE METABOLIC PANEL, NON-FASTING
ALBUMIN/GLOBULIN RATIO: 1.2 (ref 0.8–1.4)
ALBUMIN: 3.8 g/dL (ref 3.5–5.7)
ALKALINE PHOSPHATASE: 67 U/L (ref 34–104)
ALT (SGPT): 10 U/L (ref 7–52)
ANION GAP: 10 mmol/L (ref 10–20)
AST (SGOT): 17 U/L (ref 13–39)
BILIRUBIN TOTAL: 0.5 mg/dL (ref 0.3–1.2)
BUN/CREA RATIO: 12 (ref 6–22)
BUN: 18 mg/dL (ref 7–25)
CALCIUM, CORRECTED: 9.1 mg/dL (ref 8.9–10.8)
CALCIUM: 8.9 mg/dL (ref 8.6–10.3)
CHLORIDE: 108 mmol/L — ABNORMAL HIGH (ref 98–107)
CO2 TOTAL: 26 mmol/L (ref 21–31)
CREATININE: 1.53 mg/dL — ABNORMAL HIGH (ref 0.60–1.30)
ESTIMATED GFR: 33 mL/min/{1.73_m2} — ABNORMAL LOW (ref >59)
GLOBULIN: 3.2 (ref 2.9–5.4)
GLUCOSE: 112 mg/dL — ABNORMAL HIGH (ref 74–109)
OSMOLALITY, CALCULATED: 290 mOsm/kg (ref 270–290)
POTASSIUM: 3.9 mmol/L (ref 3.5–5.1)
PROTEIN TOTAL: 7 g/dL (ref 6.4–8.9)
SODIUM: 144 mmol/L (ref 136–145)

## 2022-02-10 LAB — PARATHYROID HORMONE (PTH): PTH: 61.8 pg/mL (ref 12.0–88.0)

## 2022-02-10 LAB — VITAMIN D 25 TOTAL: VITAMIN D: 39 ng/mL (ref 30–100)

## 2022-02-10 LAB — MAGNESIUM: MAGNESIUM: 1.5 mg/dL — ABNORMAL LOW (ref 1.9–2.7)

## 2022-02-10 LAB — URIC ACID: URIC ACID: 7.1 mg/dL (ref 2.3–7.6)

## 2022-02-10 LAB — VITAMIN B12: VITAMIN B 12: 274 pg/mL (ref 180–914)

## 2022-02-10 LAB — MICROALBUMIN URINE, RANDOM: MICROALBUMIN RANDOM URINE: 5.3 mg/dL

## 2022-02-10 LAB — FERRITIN: FERRITIN: 69 ng/mL (ref 11–336)

## 2022-02-10 LAB — PHOSPHORUS: PHOSPHORUS: 4.4 mg/dL (ref 3.7–7.2)

## 2022-02-10 NOTE — Progress Notes (Signed)
This is an 87 year old black female patient who is known to have diabetes mellitus type 2 and hypertension chronic kidney disease last serum creatinine was 2.0 GFR 31 mL/min anemia of chronic kidney disease hemoglobin 11.5 patient not taking Metformin at this point I asked the patient to drink plenty of fluids avoid NSAIDs keep blood sugar and blood pressure control repeat lab work check kidney ultrasound and see her back in 3 months she has hepatitis C in the past was treated we will check her liver function test

## 2022-02-11 LAB — HGA1C (HEMOGLOBIN A1C WITH EST AVG GLUCOSE): HEMOGLOBIN A1C: 6.3 % — ABNORMAL HIGH (ref 4.0–6.0)

## 2022-02-17 DIAGNOSIS — N184 Chronic kidney disease, stage 4 (severe): Secondary | ICD-10-CM | POA: Insufficient documentation

## 2022-02-17 DIAGNOSIS — E119 Type 2 diabetes mellitus without complications: Secondary | ICD-10-CM | POA: Insufficient documentation

## 2022-02-18 ENCOUNTER — Encounter (INDEPENDENT_AMBULATORY_CARE_PROVIDER_SITE_OTHER): Payer: Self-pay | Admitting: Nephrology

## 2022-02-18 ENCOUNTER — Other Ambulatory Visit: Payer: Self-pay

## 2022-02-18 ENCOUNTER — Ambulatory Visit (INDEPENDENT_AMBULATORY_CARE_PROVIDER_SITE_OTHER): Payer: Commercial Managed Care - PPO | Admitting: Nephrology

## 2022-02-18 VITALS — BP 158/69 | HR 64 | Ht 63.0 in | Wt 172.0 lb

## 2022-02-18 DIAGNOSIS — E1122 Type 2 diabetes mellitus with diabetic chronic kidney disease: Secondary | ICD-10-CM

## 2022-02-18 DIAGNOSIS — D631 Anemia in chronic kidney disease: Secondary | ICD-10-CM

## 2022-02-18 DIAGNOSIS — I129 Hypertensive chronic kidney disease with stage 1 through stage 4 chronic kidney disease, or unspecified chronic kidney disease: Secondary | ICD-10-CM

## 2022-02-18 DIAGNOSIS — E559 Vitamin D deficiency, unspecified: Secondary | ICD-10-CM

## 2022-02-18 DIAGNOSIS — E119 Type 2 diabetes mellitus without complications: Secondary | ICD-10-CM

## 2022-02-18 DIAGNOSIS — I10 Essential (primary) hypertension: Secondary | ICD-10-CM | POA: Insufficient documentation

## 2022-02-18 DIAGNOSIS — N184 Chronic kidney disease, stage 4 (severe): Secondary | ICD-10-CM

## 2022-02-18 DIAGNOSIS — N1832 Chronic kidney disease, stage 3b: Secondary | ICD-10-CM | POA: Insufficient documentation

## 2022-02-18 MED ORDER — CYANOCOBALAMIN (VIT B-12) 1,000 MCG TABLET
1000.0000 ug | ORAL_TABLET | Freq: Every day | ORAL | 12 refills | Status: AC
Start: 2022-02-18 — End: 2022-03-20

## 2022-02-18 MED ORDER — MAGNESIUM OXIDE 400 MG (241.3 MG MAGNESIUM) TABLET
400.0000 mg | ORAL_TABLET | Freq: Every day | ORAL | 12 refills | Status: AC
Start: 2022-02-18 — End: 2022-03-20

## 2022-02-18 NOTE — Progress Notes (Signed)
NEPHROLOGY, [facility]  [address]   [place] [id number]       Name: Connie Barber MRN:  [id number]   Date of Birth: 07-20-1935 Age: 87 y.o.   Date: 02/18/2022  Time: 15:54       Nephrology Office Note    Reason for visit: Follow Up (F/U labs )      History of Present Illness:  Connie Barber  is an 87 year old black female patient who is known to have diabetes mellitus type 2 and hypertension chronic kidney disease last serum creatinine was 2.0 GFR 31 mL/min anemia of chronic kidney disease hemoglobin 11.5 patient not taking Metformin at this point I asked the patient to drink plenty of fluids avoid NSAIDs keep blood sugar and blood pressure control repeat lab work check kidney ultrasound and see her back in 3 months she has hepatitis C in the past was treated we will check her liver function test.  02-18-2022  Nephrology clinic visit for this patient CKD between stage III and 4 creatinine 1.53 GFR 33 mL/minute better than previous readings anemia of chronic kidney disease hemoglobin of 12.7 continue current treatment drink plenty of fluids avoid NSAIDs keep sugar blood pressure control repeat lab work see her back in 6 months magnesium is low 1.5 needs magnesium supplements and also vitamin-D supplements and vitamin B12 supplements      Past Medical History:  Past Medical History:   Diagnosis Date   . CKD (chronic kidney disease)     Stage 3b   . Coronary artery disease    . Diabetes mellitus (CMS [place])    . Dyslipidemia    . Essential hypertension    . Hepatitis C          Past Surgical History:  Past Surgical History:   Procedure Laterality Date   . CARDIAC CATHETERIZATION  11/20/2014   . HX APPENDECTOMY     . HX HYSTERECTOMY           Allergies:  No Known Allergies  Medications:  Current Outpatient Medications   Medication Sig   . amLODIPine (NORVASC) 10 mg Oral Tablet Take 1 Tablet (10 mg total) by mouth Once a day for 90 days   . atenoloL (TENORMIN) 25 mg Oral Tablet Take 2  Tablets (50 mg total) by mouth Once a day for 180 days   . Colchicine-Probenecid 500-0.5 mg Oral Tablet Take 1 Tablet by mouth Twice daily with food   . ergocalciferol, vitamin D2, (DRISDOL) 1,250 mcg (50,000 unit) Oral Capsule Take 1 Capsule (50,000 Units total) by mouth Every 7 days   . ezetimibe (ZETIA) 10 mg Oral Tablet Take 1 Tablet (10 mg total) by mouth Every morning for 180 days   . glimepiride (AMARYL) 2 mg Oral Tablet Take 1 Tablet (2 mg total) by mouth Every morning with breakfast   . levothyroxine (SYNTHROID) 75 mcg Oral Tablet Take 1 Tablet (75 mcg total) by mouth Every morning   . olmesartan (BENICAR) 40 mg Oral Tablet Take 1 Tablet (40 mg total) by mouth Once a day for 90 days   . SITagliptin phosphate (JANUVIA) 50 mg Oral Tablet Take 1 Tablet (50 mg total) by mouth Once a day     Family History:  Family Medical History:     Problem Relation (Age of Onset)    Heart Disease Mother    Hypertension (High Blood Pressure) Mother    No Known Problems Sister, Brother, Maternal Grandmother, Maternal Grandfather,  Paternal Grandmother, Paternal Grandfather, Daughter, Son, Maternal Aunt, Maternal Uncle, Paternal 30, Paternal Uncle, Other    Stroke Father          Social History:  Social History     Socioeconomic History   . Marital status: Widowed   Tobacco Use   . Smoking status: Never   . Smokeless tobacco: Never   Vaping Use   . Vaping Use: Never used   Substance and Sexual Activity   . Alcohol use: Never   . Drug use: Never       Review of Systems:  Constitutional: negative for fevers, chills, sweats, and fatigue  Eyes: negative for visual disturbance, irritation, redness, and icterus  Ears, nose, mouth, throat, and face: negative for hearing loss, ear drainage, nasal congestion, epistaxis  Respiratory: negative for cough, sputum, hemoptysis, wheezing, or dyspnea on exertion  Cardiovascular: negative for chest pain, palpitations, syncope, orthopnea, paroxysmal nocturnal dyspnea, and   Gastrointestinal:  negative for dysphagia, nausea, vomiting, melena, diarrhea, constipation, and abdominal pain  Genitourinary:negative for frequency, dysuria, nocturia, urinary incontinence, hesitancy, and hematuria  Integument/breast: negative for rash, skin lesion(s), and pruritus  Hematologic/lymphatic: negative for easy bruising, bleeding, and petechiae  Musculoskeletal:negative for myalgias, arthralgias, neck pain, back pain, no lower extremity edema, and muscle weakness  Neurological: negative for headaches, dizziness, seizures, speech problems, tremor, and weakness  Behavioral/Psych: negative for anxiety, behavior problems, mood swings, and sleep disturbance  Endocrine: negative for temperature intolerance  Allergic/Immunologic: negative for urticaria and angioedema    Physical Exam:  Vitals:    02/18/22 1530   BP: (!) 158/69   Pulse: 64   Weight: 78 kg (172 lb)   Height: 1.6 m (5\' 3" )   BMI: 30.53      Constitutional: Alert and Oriented, no distress,   HEENT : normocephalic , atraumatic vision and hearing grossly normal   Eyes: Conjunctiva clear., Pupils equal and round, reactive to light and accomodation. , Sclera non-icteric.   ENT: Nose without erythema. , Mouth mucous membranes moist.   Neck: no thyromegaly or lymphadenopathy and supple, symmetrical, trachea midline  Respiratory: Clear to auscultation bilaterally. No wheezes, No rales, Good air exchange bilaterally  Cardiovascular: regular rate and rhythm no murmurs no rub  Gastrointestinal: Soft, non-tender, Bowel sounds normal, No hepatosplenomegaly  Genitourinary: no suprapubic tenderness  Lower Extremities: No edema, Pulses +4  Neurologic: Grossly normal. Speech clear.   Psychiatric: Normal mood and affect     Lab:  Lab Results   Component Value Date    BUN 18 02/10/2022    BUN 20 10/28/2021    CREATININE 1.53 (H) 02/10/2022    CREATININE 1.56 (H) 10/28/2021    BUNCRRATIO 12 02/10/2022    BUNCRRATIO 13 10/28/2021    GFR 33 (L) 02/10/2022    GFR 33 (L) 10/28/2021     SODIUM 144 02/10/2022    SODIUM 143 10/28/2021    POTASSIUM 3.9 02/10/2022    POTASSIUM 3.9 10/28/2021    CHLORIDE 108 (H) 02/10/2022    CHLORIDE 106 10/28/2021    CO2 26 02/10/2022    CO2 30 10/28/2021    ANIONGAP 10 02/10/2022    ANIONGAP 7 (L) 10/28/2021    CALCIUM 8.9 02/10/2022    CALCIUM 8.7 10/28/2021    PHOSPHORUS 4.4 02/10/2022    ALBUMIN 3.8 02/10/2022    ALBUMIN 3.9 10/28/2021    HGB 12.7 02/10/2022    HGB 13.0 10/28/2021    HCT 38.2 02/10/2022    HCT 38.4 10/28/2021  INTACTPTH 61.8 02/10/2022    IRON 81 02/10/2022    IRONBINDCAP 336 02/10/2022    IRONSAT 24 02/10/2022    FERRITIN 69 02/10/2022       Lab Results   Component Value Date    HA1C 6.3 (H) 02/10/2022    HA1C 6.8 (H) 10/28/2021    URICACID 7.1 02/10/2022        Assessment and Plan:  Connie Barber   1. Chronic kidney disease, stage 4 (severe) (CMS HCC)  N18.4   2. Type 2 diabetes mellitus without complication, without long-term current use of insulin (CMS HCC)  E11.9   3. Anemia in stage 3b chronic kidney disease (CMS HCC)  N18.32    D63.1   4. Hypomagnesemia  E83.42   5. Vitamin D deficiency  E55.9   6. Essential hypertension  I10        Plan:  CKD between stage III and 4 kidney function better drinking continue to drink enough fluids keep sugar blood pressure controlled avoid NSAIDs repeat lab work see her back in 6 months  Essential hypertension blood pressure fairly controlled continue current medicines  Anemia of chronic kidney disease vitamin B12 is low continue iron and will start vitamin B12 supplement  Vitamin-D within normal continue supplements  Hypomagnesemia needs magnesium supplements             No follow-ups on file.    Richardean Sale, MD     Portions of this note may be dictated using voice recognition software or a dictation service. Variances in spelling and vocabulary are possible and unintentional. Not all errors are caught/corrected. Please notify the [facility] if any discrepancies are noted or if the  meaning of any statement is not clear. ,b

## 2022-02-18 NOTE — Addendum Note (Signed)
Addended by: Rogue Jury D on: 02/18/2022 04:01 PM     Modules accepted: Orders

## 2022-05-05 ENCOUNTER — Inpatient Hospital Stay
Admission: RE | Admit: 2022-05-05 | Discharge: 2022-05-05 | Disposition: A | Discharge status: Home / Self Care | Payer: Medicare PPO | Source: Ambulatory Visit | Attending: Nephrology | Admitting: Nephrology

## 2022-05-05 ENCOUNTER — Other Ambulatory Visit: Payer: Self-pay

## 2022-05-05 DIAGNOSIS — N184 Chronic kidney disease, stage 4 (severe): Secondary | ICD-10-CM | POA: Insufficient documentation

## 2022-05-20 ENCOUNTER — Other Ambulatory Visit: Payer: Self-pay

## 2022-05-20 ENCOUNTER — Ambulatory Visit (INDEPENDENT_AMBULATORY_CARE_PROVIDER_SITE_OTHER): Payer: Medicare PPO | Admitting: NURSE PRACTITIONER

## 2022-05-20 ENCOUNTER — Encounter (INDEPENDENT_AMBULATORY_CARE_PROVIDER_SITE_OTHER): Payer: Self-pay | Admitting: NURSE PRACTITIONER

## 2022-05-20 VITALS — BP 167/79 | HR 73 | Ht 63.0 in | Wt 172.4 lb

## 2022-05-20 DIAGNOSIS — I10 Essential (primary) hypertension: Secondary | ICD-10-CM

## 2022-05-20 DIAGNOSIS — I5030 Unspecified diastolic (congestive) heart failure: Secondary | ICD-10-CM

## 2022-05-20 DIAGNOSIS — I2510 Atherosclerotic heart disease of native coronary artery without angina pectoris: Secondary | ICD-10-CM

## 2022-05-20 DIAGNOSIS — E785 Hyperlipidemia, unspecified: Secondary | ICD-10-CM

## 2022-05-20 NOTE — Progress Notes (Signed)
Cardiology [facility]    Name: Connie Barber  Age: 87 y.o.  Date of Service: 05/20/2022    Primary Care Provider: Sharman Cheek, MD  Chief Complaint:   Chief Complaint   Patient presents with    Heart Disease    Hyperlipidemia    Hypertension    Follow Up 6 Months       Subjective:  The patient has a history of CAD. In April 2016 she underwent cardiac catheterization due to abnormal stress test which revealed a small to moderate apical reversible perfusion defect. Cardiac catheterization was then performed and this revealed EF 50% with maybe some slight basal inferior akinesis and some questionable anterior hypokinesis. The right coronary artery was a small nondominant vessel. LAD was a large vessel wrapped around the apex and had 50% mid stenosis. Left circumflex was a dominant vessel appeared to be angiographically normal. The patient has echocardiogram in October 2015, which was unremarkable with normal LV function. No significant valvular disease. Holter recording has been okay. Lipids have been well controlled. Despite this, was started on low dose statin due to her moderate CAD. Repeat echocardiogram in December 2016 revealed EF of 55 to 60%. Normal study.  Repeat echocardiogram on 09-17-20 which showed normal LV function, EF 60%.  There is moderate diastolic dysfunction.  Mild LVH and mild left atrial enlargement.     05-10-21 The patient is here for routine follow-up for CAD.  She denies any chest pains.  She does have some shortness of breath with exertion, but this is unchanged.  She is tolerating medications well.  Blood pressure was elevated today.  She states her blood pressure cuff at home broke so she has not had 1, but she decided that when sent to her.  She states previously her blood pressure usually stays in the 846K systolic.  She did have labs last month which looked really good.  Her A1c had improved to 6.5, total cholesterol 135, triglycerides 52, HDL 44, LDL 81, BUN  27, creatinine 1.5, sodium 143, potassium 4.2.    11/19/2021: The patient is here for routine follow up for CAD.  She denies any chest pain or palpitations.  She does report she has shortness of breath chronically if common stairs otherwise denies any PND or orthopnea.  She does have bilateral lower extremity edema that she says comes and goes in regards to worsening.  She is diabeti,  last known A1c was 6.5.  The blood pressure is elevated today however home readings have been 130s to 140s most of the time.  She had recent labs with her primary care however I do not have a copy of those reports at this time.    05/20/22 The patient is here for CAD f/u.  She denies any chest pains.  She does get shortness of breath going up stairs, but this is unchanged for her.  She is tolerating medications well blood pressure was elevated today, she was checking.  She states she is supposed to be getting a new blood pressure cuff.  Her swelling in her legs has been stable.  She does notice if she eats more salt or keeps her legs down in his worse.  She has not been taking fluid medicine though.  If she props her legs up for swelling usually improves.    Past Medical History:  Past Medical History:   Diagnosis Date    CKD (chronic kidney disease)     Stage 3b  Coronary artery disease     Diabetes mellitus (CMS HCC)     Dyslipidemia     Essential hypertension     Hepatitis C          Social History:  Social History     Tobacco Use   Smoking Status Never   Smokeless Tobacco Never      Social History     Substance and Sexual Activity   Alcohol Use Never      Social History     Substance and Sexual Activity   Drug Use Never      Current Medications:  Current Outpatient Medications   Medication Sig    amLODIPine (NORVASC) 10 mg Oral Tablet Take 1 Tablet (10 mg total) by mouth Once a day for 90 days    aspirin (ECOTRIN) 81 mg Oral Tablet, Delayed Release (E.C.) Take 1 Tablet (81 mg total) by mouth Once a day    atenoloL (TENORMIN) 25  mg Oral Tablet Take 2 Tablets (50 mg total) by mouth Once a day for 180 days    Colchicine-Probenecid 500-0.5 mg Oral Tablet Take 1 Tablet by mouth Every morning with breakfast    cyanocobalamin (VITAMIN B 12) 1,000 mcg Oral Tablet Take 1 Tablet (1,000 mcg total) by mouth Once a day    ergocalciferol, vitamin D2, (DRISDOL) 1,250 mcg (50,000 unit) Oral Capsule Take 1 Capsule (50,000 Units total) by mouth Every 7 days    ezetimibe (ZETIA) 10 mg Oral Tablet Take 1 Tablet (10 mg total) by mouth Every morning for 180 days    glimepiride (AMARYL) 1 mg Oral Tablet Take 1 Tablet (1 mg total) by mouth Every morning with breakfast    levothyroxine (SYNTHROID) 75 mcg Oral Tablet Take 1 Tablet (75 mcg total) by mouth Every morning    magnesium oxide (MAG-OX) 400 mg Oral Tablet Take 1 Tablet (400 mg total) by mouth Once a day    olmesartan (BENICAR) 40 mg Oral Tablet Take 1 Tablet (40 mg total) by mouth Once a day for 90 days    SITagliptin phosphate (JANUVIA) 50 mg Oral Tablet Take 1 Tablet (50 mg total) by mouth Once a day     Allergies:  No Known Allergies   Review of Systems:  Complete ROS was performed and otherwise negative unless noted in HPI.    Vital Signs:  Vitals:    05/20/22 1404   BP: (!) 167/79   Pulse: 73   SpO2: 95%   Weight: 78.2 kg (172 lb 6 oz)   Height: 1.6 m (5\' 3" )   BMI: 30.6      Physical Exam:  General: Pt resting comfortably in no acute distress and appears stated age.    Neck: No JVD, no carotid bruit. Neck supple, symmetrical, trachea midline.   Lungs:  Normal respiratory effort, lungs clear to auscultation bilaterally.    Cardiovascular:  Regular rate and rhythm.  Normal S1 and S2 without murmur, gallop, or rub.  Abdomen: Soft, non-tender and bowel sounds normal.    Extremities: Extremities bilateral lower extremity edema  Neurologic: Alert and oriented x3.     Assessment:    Coronary artery disease, unspecified vessel or lesion type, unspecified whether angina present, unspecified whether native or  transplanted heart    Heart failure with preserved ejection fraction, unspecified HF chronicity (CMS HCC)    Essential hypertension    Hyperlipidemia, unspecified hyperlipidemia type      Plan:   Patient is stable from cardiac standpoint.  We did try Jardiance last visit, I do not think her insurance covers this.  She is also only taking Zetia for statin, she was unable to tolerate other statin drugs.  I recommend she keep a log of her blood pressure and take this into her PCP at next visit to see if further adjustments to be made.  Echo last year was stable.  Return in 6 months for routine follow-up.    Orders placed this visit:  Orders Placed This Encounter    EKG (In-Clinic Today)       [place] is to return to clinic for follow up with the understanding that should symptoms change or worsen she is to call the office or go to the closest emergency department for evaluation.    Isabell Jarvis, APRN,FNP-BC    A portion of this documentation may have been generated using MMODAL voice recognition software and may contain syntax/voice recognition errors.

## 2022-06-04 LAB — ECG W INTERP (AMB USE ONLY)(MUSE,IN CLINIC)
Atrial Rate: 68 {beats}/min
Calculated P Axis: 63 degrees
Calculated R Axis: -23 degrees
Calculated T Axis: -67 degrees
PR Interval: 174 ms
QRS Duration: 96 ms
QT Interval: 402 ms
QTC Calculation: 427 ms
Ventricular rate: 68 {beats}/min

## 2022-07-15 ENCOUNTER — Other Ambulatory Visit (HOSPITAL_COMMUNITY): Payer: Medicare PPO

## 2022-07-15 ENCOUNTER — Ambulatory Visit (INDEPENDENT_AMBULATORY_CARE_PROVIDER_SITE_OTHER): Payer: Medicare PPO

## 2022-07-15 ENCOUNTER — Other Ambulatory Visit: Payer: Self-pay

## 2022-07-15 ENCOUNTER — Other Ambulatory Visit: Payer: Medicare PPO | Attending: Nephrology | Admitting: Nephrology

## 2022-07-15 DIAGNOSIS — E039 Hypothyroidism, unspecified: Secondary | ICD-10-CM | POA: Insufficient documentation

## 2022-07-15 DIAGNOSIS — D631 Anemia in chronic kidney disease: Secondary | ICD-10-CM

## 2022-07-15 DIAGNOSIS — E785 Hyperlipidemia, unspecified: Secondary | ICD-10-CM

## 2022-07-15 DIAGNOSIS — E559 Vitamin D deficiency, unspecified: Secondary | ICD-10-CM

## 2022-07-15 DIAGNOSIS — N184 Chronic kidney disease, stage 4 (severe): Secondary | ICD-10-CM | POA: Insufficient documentation

## 2022-07-15 DIAGNOSIS — N1832 Chronic kidney disease, stage 3b (CMS HCC): Secondary | ICD-10-CM

## 2022-07-15 DIAGNOSIS — E119 Type 2 diabetes mellitus without complications: Secondary | ICD-10-CM

## 2022-07-15 LAB — LIPID PANEL
CHOL/HDL RATIO: 2.6
CHOLESTEROL: 144 mg/dL (ref <200)
HDL CHOL: 55 mg/dL (ref 23–92)
LDL CALC: 75 mg/dL (ref 0–100)
TRIGLYCERIDES: 68 mg/dL (ref <=150)
VLDL CALC: 14 mg/dL (ref 0–50)

## 2022-07-15 LAB — CBC
HCT: 39.3 % (ref 31.2–41.9)
HGB: 12.8 g/dL (ref 10.9–14.3)
MCH: 28.4 pg (ref 24.7–32.8)
MCHC: 32.6 g/dL (ref 32.3–35.6)
MCV: 87.1 fL (ref 75.5–95.3)
MPV: 10.7 fL (ref 7.9–10.8)
PLATELETS: 184 10*3/uL (ref 140–440)
RBC: 4.51 10*6/uL (ref 3.63–4.92)
RDW: 14.9 % (ref 12.3–17.7)
WBC: 5.2 10*3/uL (ref 3.8–11.8)

## 2022-07-15 LAB — PROTEIN/CREATININE RATIO, URINE, RANDOM
CREATININE RANDOM URINE: 131 mg/dL — ABNORMAL HIGH (ref 11–26)
PROTEIN RANDOM URINE: 65 mg/dL (ref 50–80)
PROTEIN/CREATININE RATIO RANDOM URINE: 0.496 mg/mg (ref 0.000–200.000)

## 2022-07-15 LAB — COMPREHENSIVE METABOLIC PANEL, NON-FASTING
ALBUMIN/GLOBULIN RATIO: 1 (ref 0.8–1.4)
ALBUMIN: 3.8 g/dL (ref 3.5–5.7)
ALKALINE PHOSPHATASE: 76 U/L (ref 34–104)
ALT (SGPT): 11 U/L (ref 7–52)
ANION GAP: 7 mmol/L (ref 4–13)
AST (SGOT): 15 U/L (ref 13–39)
BILIRUBIN TOTAL: 0.4 mg/dL (ref 0.3–1.2)
BUN/CREA RATIO: 12 (ref 6–22)
BUN: 16 mg/dL (ref 7–25)
CALCIUM, CORRECTED: 9.5 mg/dL (ref 8.9–10.8)
CALCIUM: 9.3 mg/dL (ref 8.6–10.3)
CHLORIDE: 106 mmol/L (ref 98–107)
CO2 TOTAL: 30 mmol/L (ref 21–31)
CREATININE: 1.39 mg/dL — ABNORMAL HIGH (ref 0.60–1.30)
ESTIMATED GFR: 37 mL/min/{1.73_m2} — ABNORMAL LOW (ref >59)
GLOBULIN: 3.8 (ref 2.9–5.4)
GLUCOSE: 173 mg/dL — ABNORMAL HIGH (ref 74–109)
OSMOLALITY, CALCULATED: 290 mOsm/kg (ref 270–290)
POTASSIUM: 4.4 mmol/L (ref 3.5–5.1)
PROTEIN TOTAL: 7.6 g/dL (ref 6.4–8.9)
SODIUM: 143 mmol/L (ref 136–145)

## 2022-07-15 LAB — PHOSPHORUS: PHOSPHORUS: 3.6 mg/dL — ABNORMAL LOW (ref 3.7–7.2)

## 2022-07-15 LAB — FOLATE: FOLATE: 20.4 ng/mL (ref 5.9–24.4)

## 2022-07-15 LAB — MAGNESIUM: MAGNESIUM: 1.9 mg/dL (ref 1.9–2.7)

## 2022-07-15 LAB — VITAMIN D 25 TOTAL: VITAMIN D: 37 ng/mL (ref 30–100)

## 2022-07-15 LAB — THYROID STIMULATING HORMONE (SENSITIVE TSH): TSH: 2.758 u[IU]/mL (ref 0.450–5.330)

## 2022-07-15 LAB — PARATHYROID HORMONE (PTH): PTH: 101.7 pg/mL — ABNORMAL HIGH (ref 12.0–88.0)

## 2022-07-15 LAB — IRON TRANSFERRIN AND TIBC
IRON (TRANSFERRIN) SATURATION: 30 % (ref 15–50)
IRON: 84 ug/dL (ref 50–212)
TOTAL IRON BINDING CAPACITY: 283 ug/dL (ref 250–450)
TRANSFERRIN: 202 mg/dL — ABNORMAL LOW (ref 203–362)
UIBC: 199 ug/dL (ref 130–375)

## 2022-07-15 LAB — VITAMIN B12: VITAMIN B 12: 1042 pg/mL — ABNORMAL HIGH (ref 180–914)

## 2022-07-15 LAB — URIC ACID: URIC ACID: 5.5 mg/dL (ref 2.3–7.6)

## 2022-07-15 LAB — HGA1C (HEMOGLOBIN A1C WITH EST AVG GLUCOSE): HEMOGLOBIN A1C: 6 % (ref 4.0–6.0)

## 2022-07-22 ENCOUNTER — Encounter (INDEPENDENT_AMBULATORY_CARE_PROVIDER_SITE_OTHER): Payer: Self-pay | Admitting: Nephrology

## 2022-07-22 ENCOUNTER — Ambulatory Visit (INDEPENDENT_AMBULATORY_CARE_PROVIDER_SITE_OTHER): Payer: Medicare PPO | Admitting: Nephrology

## 2022-07-22 ENCOUNTER — Other Ambulatory Visit: Payer: Self-pay

## 2022-07-22 VITALS — BP 156/77 | HR 74 | Ht 63.0 in | Wt 177.0 lb

## 2022-07-22 DIAGNOSIS — I10 Essential (primary) hypertension: Secondary | ICD-10-CM

## 2022-07-22 DIAGNOSIS — E559 Vitamin D deficiency, unspecified: Secondary | ICD-10-CM

## 2022-07-22 DIAGNOSIS — D631 Anemia in chronic kidney disease: Secondary | ICD-10-CM

## 2022-07-22 DIAGNOSIS — E119 Type 2 diabetes mellitus without complications: Secondary | ICD-10-CM

## 2022-07-22 DIAGNOSIS — N183 Chronic kidney disease, stage 3 unspecified (CMS HCC): Secondary | ICD-10-CM

## 2022-07-22 DIAGNOSIS — N1832 Chronic kidney disease, stage 3b (CMS HCC): Secondary | ICD-10-CM

## 2022-07-22 DIAGNOSIS — N2581 Secondary hyperparathyroidism of renal origin: Secondary | ICD-10-CM

## 2022-07-22 DIAGNOSIS — E1122 Type 2 diabetes mellitus with diabetic chronic kidney disease: Secondary | ICD-10-CM

## 2022-07-22 DIAGNOSIS — I129 Hypertensive chronic kidney disease with stage 1 through stage 4 chronic kidney disease, or unspecified chronic kidney disease: Secondary | ICD-10-CM

## 2022-07-22 MED ORDER — OLMESARTAN 40 MG TABLET
40.0000 mg | ORAL_TABLET | Freq: Every day | ORAL | 3 refills | Status: DC
Start: 2022-07-22 — End: 2023-08-21

## 2022-07-22 MED ORDER — CALCITRIOL 0.25 MCG CAPSULE
0.2500 ug | ORAL_CAPSULE | Freq: Every day | ORAL | 0 refills | Status: DC
Start: 2022-07-22 — End: 2022-08-04

## 2022-07-22 NOTE — Progress Notes (Signed)
[facility], [facility]  [address]  [place] [id number]       Name: Connie Barber MRN:  [id number]   Date of Birth: March 11, 1935 Age: 87 y.o.   Date: 07/22/2022  Time: 15:38       Nephrology Office Note    Reason for visit: Follow Up (F/U labs)      History of Present Illness:  Connie Barber is a 88 y.o. female presenting with  is an 87 year old black female patient who is known to have diabetes mellitus type 2 and hypertension chronic kidney disease last serum creatinine was 2.0 GFR 31 mL/min anemia of chronic kidney disease hemoglobin 11.5 patient not taking Metformin at this point I asked the patient to drink plenty of fluids avoid NSAIDs keep blood sugar and blood pressure control repeat lab work check kidney ultrasound and see her back in 3 months she has hepatitis C in the past was treated we will check her liver function test.  02-18-2022  Nephrology clinic visit for this patient CKD between stage III and 4 creatinine 1.53 GFR 33 mL/minute better than previous readings anemia of chronic kidney disease hemoglobin of 12.7 continue current treatment drink plenty of fluids avoid NSAIDs keep sugar blood pressure control repeat lab work see her back in 6 months magnesium is low 1.5 needs magnesium supplements and also vitamin-D supplements and vitamin B12 supplements   07-22-2022  Nephrology Clinic follow-up in this patient with CKD stage IIIB stable kidney function lab work showed hemoglobin 12.8 creatinine 1.3 GFR 37 anemia profile iron within normal PTH 101 start Rocaltrol 0.25 mcg once a day folic acid vitamin G28 vitamin-D all within normal kidney ultrasound no hydronephrosis drink plenty of fluids avoid NSAIDs keep sugar blood pressure control repeat lab work see her back in 6 months  .    Past Medical History:  Past Medical History:   Diagnosis Date    CKD (chronic kidney disease)     Stage 3b    Coronary artery disease     Diabetes mellitus (CMS HCC)     Dyslipidemia      Essential hypertension     Hepatitis C          Past Surgical History:  Past Surgical History:   Procedure Laterality Date    CARDIAC CATHETERIZATION  11/20/2014    HX APPENDECTOMY      HX HYSTERECTOMY           Allergies:  No Known Allergies  Medications:  Current Outpatient Medications   Medication Sig    amLODIPine (NORVASC) 10 mg Oral Tablet Take 1 Tablet (10 mg total) by mouth Once a day for 90 days    aspirin (ECOTRIN) 81 mg Oral Tablet, Delayed Release (E.C.) Take 1 Tablet (81 mg total) by mouth Once a day    atenoloL (TENORMIN) 25 mg Oral Tablet Take 2 Tablets (50 mg total) by mouth Once a day for 180 days    Colchicine-Probenecid 500-0.5 mg Oral Tablet Take 1 Tablet by mouth Every morning with breakfast    cyanocobalamin (VITAMIN B 12) 1,000 mcg Oral Tablet Take 1 Tablet (1,000 mcg total) by mouth Once a day    ergocalciferol, vitamin D2, (DRISDOL) 1,250 mcg (50,000 unit) Oral Capsule Take 1 Capsule (50,000 Units total) by mouth Every 7 days    ezetimibe (ZETIA) 10 mg Oral Tablet Take 1 Tablet (10 mg total) by mouth Every morning for 180 days    glimepiride (AMARYL) 1  mg Oral Tablet Take 1 Tablet (1 mg total) by mouth Every morning with breakfast    levothyroxine (SYNTHROID) 75 mcg Oral Tablet Take 1 Tablet (75 mcg total) by mouth Every morning    magnesium oxide (MAG-OX) 400 mg Oral Tablet Take 1 Tablet (400 mg total) by mouth Once a day    olmesartan (BENICAR) 40 mg Oral Tablet Take 1 Tablet (40 mg total) by mouth Once a day for 360 days Indications: chronic kidney disease with albuminuria    SITagliptin phosphate (JANUVIA) 50 mg Oral Tablet Take 1 Tablet (50 mg total) by mouth Once a day     Family History:  Family Medical History:       Problem Relation (Age of Onset)    Heart Disease Mother    Hypertension (High Blood Pressure) Mother    No Known Problems Sister, Brother, Maternal Grandmother, Maternal Grandfather, Paternal Grandmother, Paternal Grandfather, Daughter, Son, Maternal Aunt, Maternal  Uncle, Paternal 32, Paternal Uncle, Other    Stroke Father            Social History:  Social History     Socioeconomic History    Marital status: Widowed   Tobacco Use    Smoking status: Never    Smokeless tobacco: Never   Vaping Use    Vaping Use: Never used   Substance and Sexual Activity    Alcohol use: Never    Drug use: Never       Review of Systems:  Constitutional: negative for fevers, chills, sweats, and fatigue  Eyes: negative for visual disturbance, irritation, redness, and icterus  Ears, nose, mouth, throat, and face: negative for hearing loss, ear drainage, nasal congestion, epistaxis  Respiratory: negative for cough, sputum, hemoptysis, wheezing, or dyspnea on exertion  Cardiovascular: negative for chest pain, palpitations, syncope, orthopnea, paroxysmal nocturnal dyspnea, and   Gastrointestinal: negative for dysphagia, nausea, vomiting, melena, diarrhea, constipation, and abdominal pain  Genitourinary:negative for frequency, dysuria, nocturia, urinary incontinence, hesitancy, and hematuria  Integument/breast: negative for rash, skin lesion(s), and pruritus  Hematologic/lymphatic: negative for easy bruising, bleeding, and petechiae  Musculoskeletal:negative for myalgias, arthralgias, neck pain, back pain, no lower extremity edema, and muscle weakness  Neurological: negative for headaches, dizziness, seizures, speech problems, tremor, and weakness  Behavioral/Psych: negative for anxiety, behavior problems, mood swings, and sleep disturbance  Endocrine: negative for temperature intolerance  Allergic/Immunologic: negative for urticaria and angioedema    Physical Exam:  Vitals:    07/22/22 1447   BP: (!) 156/77   Pulse: 74   Weight: 80.3 kg (177 lb)   Height: 1.6 m (5\' 3" )   BMI: 31.42      Constitutional: Alert and Oriented, no distress,   HEENT : normocephalic , atraumatic vision and hearing grossly normal   Eyes: Conjunctiva clear., Pupils equal and round, reactive to light and accomodation. , Sclera  non-icteric.   ENT: Nose without erythema. , Mouth mucous membranes moist.   Neck: no thyromegaly or lymphadenopathy and supple, symmetrical, trachea midline  Respiratory: Clear to auscultation bilaterally. No wheezes, No rales, Good air exchange bilaterally  Cardiovascular: regular rate and rhythm no murmurs no rub  Gastrointestinal: Soft, non-tender, Bowel sounds normal, No hepatosplenomegaly  Genitourinary: no suprapubic tenderness  Lower Extremities: No edema, Pulses +4  Neurologic: Grossly normal. Speech clear.   Psychiatric: Normal mood and affect     Lab:  Lab Results   Component Value Date    BUN 16 07/15/2022    BUN 18 02/10/2022  BUN 20 10/28/2021    CREATININE 1.39 (H) 07/15/2022    CREATININE 1.53 (H) 02/10/2022    CREATININE 1.56 (H) 10/28/2021    BUNCRRATIO 12 07/15/2022    BUNCRRATIO 12 02/10/2022    BUNCRRATIO 13 10/28/2021    GFR 37 (L) 07/15/2022    GFR 33 (L) 02/10/2022    GFR 33 (L) 10/28/2021    SODIUM 143 07/15/2022    SODIUM 144 02/10/2022    SODIUM 143 10/28/2021    POTASSIUM 4.4 07/15/2022    POTASSIUM 3.9 02/10/2022    POTASSIUM 3.9 10/28/2021    CHLORIDE 106 07/15/2022    CHLORIDE 108 (H) 02/10/2022    CHLORIDE 106 10/28/2021    CO2 30 07/15/2022    CO2 26 02/10/2022    CO2 30 10/28/2021    ANIONGAP 7 07/15/2022    ANIONGAP 10 02/10/2022    ANIONGAP 7 (L) 10/28/2021    CALCIUM 9.3 07/15/2022    CALCIUM 8.9 02/10/2022    CALCIUM 8.7 10/28/2021    PHOSPHORUS 3.6 (L) 07/15/2022    PHOSPHORUS 4.4 02/10/2022    ALBUMIN 3.8 07/15/2022    ALBUMIN 3.8 02/10/2022    ALBUMIN 3.9 10/28/2021    HGB 12.8 07/15/2022    HGB 12.7 02/10/2022    HGB 13.0 10/28/2021    HCT 39.3 07/15/2022    HCT 38.2 02/10/2022    HCT 38.4 10/28/2021    INTACTPTH 101.7 (H) 07/15/2022    INTACTPTH 61.8 02/10/2022    IRON 84 07/15/2022    IRON 81 02/10/2022    IRONBINDCAP 283 07/15/2022    IRONBINDCAP 336 02/10/2022    IRONSAT 30 07/15/2022    IRONSAT 24 02/10/2022    FERRITIN 69 02/10/2022       Lab Results   Component  Value Date    HA1C 6.0 07/15/2022    HA1C 6.3 (H) 02/10/2022    HA1C 6.8 (H) 10/28/2021    URICACID 5.5 07/15/2022    URICACID 7.1 02/10/2022        Assessment and Plan:  ENCOUNTER DIAGNOSES     ICD-10-CM   1. Anemia in stage 3b chronic kidney disease (CMS HCC)   N18.32    D63.1   2. Vitamin D deficiency  E55.9   3. Hypomagnesemia  E83.42   4. Diabetes mellitus (CMS HCC)  E11.9   5. Essential hypertension  I10   6. Chronic kidney disease, stage 3 (CMS HCC)  N18.30   7. Type 2 diabetes mellitus without complication, without long-term current use of insulin (CMS HCC)  E11.9   8. Secondary hyperparathyroidism of renal origin (CMS [place])  N25.81        Plan:  CKD stage IIIB stable kidney function drink plenty of fluids avoid NSAIDs keep sugar blood pressure control repeat lab work see her back in 6 months  Anemia of chronic kidney disease stable kidney function and stable hemoglobin no need for supplements  Secondary hyperparathyroidism start Rocaltrol 0.25 mcg once a day  Diabetes mellitus type 2 keep sugar controlled hemoglobin A1c 6  Essential hypertension blood pressure on the high side her primary care doctor managing that continue current medicines  Hypomagnesemia magnesium 1.9 continue supplements                     No follow-ups on file.    Richardean Sale, MD     Portions of this note may be dictated using voice recognition software or a dictation service. Variances in spelling and  vocabulary are possible and unintentional. Not all errors are caught/corrected. Please notify the [facility] if any discrepancies are noted or if the meaning of any statement is not clear. ,b

## 2022-07-22 NOTE — Addendum Note (Signed)
Addended by: Rogue Jury D on: 07/22/2022 03:41 PM     Modules accepted: Orders

## 2022-08-04 ENCOUNTER — Other Ambulatory Visit (INDEPENDENT_AMBULATORY_CARE_PROVIDER_SITE_OTHER): Payer: Self-pay | Admitting: Nephrology

## 2022-08-04 MED ORDER — CALCITRIOL 0.25 MCG CAPSULE
0.2500 ug | ORAL_CAPSULE | Freq: Every day | ORAL | 0 refills | Status: AC
Start: 2022-08-04 — End: 2022-09-03

## 2022-08-04 NOTE — Telephone Encounter (Signed)
SENT CALCITROL 0.25 MG ON 08/04/22 AT 9:58 AM TO [facility]  PER DR. Alwyn Pea

## 2022-08-14 ENCOUNTER — Other Ambulatory Visit (INDEPENDENT_AMBULATORY_CARE_PROVIDER_SITE_OTHER): Payer: Self-pay

## 2022-08-21 ENCOUNTER — Encounter (INDEPENDENT_AMBULATORY_CARE_PROVIDER_SITE_OTHER): Payer: Self-pay | Admitting: Nephrology

## 2022-10-20 ENCOUNTER — Other Ambulatory Visit: Payer: Self-pay

## 2022-10-20 ENCOUNTER — Other Ambulatory Visit: Payer: Medicare PPO

## 2022-10-20 DIAGNOSIS — E119 Type 2 diabetes mellitus without complications: Secondary | ICD-10-CM | POA: Insufficient documentation

## 2022-10-20 DIAGNOSIS — I10 Essential (primary) hypertension: Secondary | ICD-10-CM | POA: Insufficient documentation

## 2022-10-20 DIAGNOSIS — E785 Hyperlipidemia, unspecified: Secondary | ICD-10-CM | POA: Insufficient documentation

## 2022-10-20 DIAGNOSIS — R7989 Other specified abnormal findings of blood chemistry: Secondary | ICD-10-CM | POA: Insufficient documentation

## 2022-10-20 LAB — URINALYSIS, MACROSCOPIC
BILIRUBIN: NEGATIVE mg/dL
BLOOD: NEGATIVE mg/dL
GLUCOSE: NEGATIVE mg/dL
KETONES: NEGATIVE mg/dL
LEUKOCYTES: 75 WBCs/uL — AB
NITRITE: NEGATIVE
PH: 5 (ref 5.0–9.0)
PROTEIN: NEGATIVE mg/dL
SPECIFIC GRAVITY: 1.016 (ref 1.002–1.030)
UROBILINOGEN: NORMAL mg/dL

## 2022-10-20 LAB — COMPREHENSIVE METABOLIC PANEL, NON-FASTING
ALBUMIN/GLOBULIN RATIO: 1.1 (ref 0.8–1.4)
ALBUMIN: 4.2 g/dL (ref 3.5–5.7)
ALKALINE PHOSPHATASE: 85 U/L (ref 34–104)
ALT (SGPT): 19 U/L (ref 7–52)
ANION GAP: 10 mmol/L (ref 4–13)
AST (SGOT): 24 U/L (ref 13–39)
BILIRUBIN TOTAL: 0.5 mg/dL (ref 0.3–1.2)
BUN/CREA RATIO: 19 (ref 6–22)
BUN: 29 mg/dL — ABNORMAL HIGH (ref 7–25)
CALCIUM, CORRECTED: 9.4 mg/dL (ref 8.9–10.8)
CALCIUM: 9.6 mg/dL (ref 8.6–10.3)
CHLORIDE: 103 mmol/L (ref 98–107)
CO2 TOTAL: 29 mmol/L (ref 21–31)
CREATININE: 1.56 mg/dL — ABNORMAL HIGH (ref 0.60–1.30)
ESTIMATED GFR: 32 mL/min/{1.73_m2} — ABNORMAL LOW (ref >59)
GLOBULIN: 3.7 (ref 2.9–5.4)
GLUCOSE: 151 mg/dL — ABNORMAL HIGH (ref 74–109)
OSMOLALITY, CALCULATED: 292 mOsm/kg — ABNORMAL HIGH (ref 270–290)
POTASSIUM: 4.2 mmol/L (ref 3.5–5.1)
PROTEIN TOTAL: 7.9 g/dL (ref 6.4–8.9)
SODIUM: 142 mmol/L (ref 136–145)

## 2022-10-20 LAB — LIPID PANEL
CHOL/HDL RATIO: 3.3
CHOLESTEROL: 180 mg/dL (ref <200)
HDL CHOL: 55 mg/dL (ref 23–92)
LDL CALC: 106 mg/dL — ABNORMAL HIGH (ref 0–100)
TRIGLYCERIDES: 95 mg/dL (ref <=150)
VLDL CALC: 19 mg/dL (ref 0–50)

## 2022-10-20 LAB — THYROID STIMULATING HORMONE (SENSITIVE TSH): TSH: 3.042 u[IU]/mL (ref 0.450–5.330)

## 2022-10-20 LAB — CBC
HCT: 39.4 % (ref 31.2–41.9)
HGB: 12.9 g/dL (ref 10.9–14.3)
MCH: 28.8 pg (ref 24.7–32.8)
MCHC: 32.8 g/dL (ref 32.3–35.6)
MCV: 87.7 fL (ref 75.5–95.3)
MPV: 11.3 fL — ABNORMAL HIGH (ref 7.9–10.8)
PLATELETS: 208 10*3/uL (ref 140–440)
RBC: 4.49 10*6/uL (ref 3.63–4.92)
RDW: 15 % (ref 12.3–17.7)
WBC: 6.6 10*3/uL (ref 3.8–11.8)

## 2022-10-20 LAB — URINALYSIS, MICROSCOPIC
HYALINE CASTS: 4 /lpf — ABNORMAL HIGH (ref <0)
RBCS: 3 /hpf (ref <4)
RENAL EPITHELIAL CELLS URINE: <1 /hpf (ref <6)
SQUAMOUS EPITHELIAL: 3 /hpf (ref <28)
TRANSITIONAL EPITHELIAL CELLS URINE: 1 /hpf (ref <6)
WBCS: 7 /hpf — ABNORMAL HIGH (ref <6)

## 2022-10-20 LAB — VITAMIN D 25 TOTAL: VITAMIN D: 37 ng/mL (ref 30–100)

## 2022-10-21 LAB — HGA1C (HEMOGLOBIN A1C WITH EST AVG GLUCOSE): HEMOGLOBIN A1C: 6.7 % — ABNORMAL HIGH (ref 4.0–6.0)

## 2022-10-22 LAB — LDL CHOLESTEROL, DIRECT: LDL DIRECT: 119 mg/dL — ABNORMAL HIGH (ref <100)

## 2022-10-22 LAB — URINE CULTURE: URINE CULTURE: NO GROWTH

## 2022-11-18 ENCOUNTER — Other Ambulatory Visit: Payer: Self-pay

## 2022-11-18 ENCOUNTER — Encounter (INDEPENDENT_AMBULATORY_CARE_PROVIDER_SITE_OTHER): Payer: Self-pay | Admitting: NURSE PRACTITIONER

## 2022-11-18 ENCOUNTER — Ambulatory Visit (INDEPENDENT_AMBULATORY_CARE_PROVIDER_SITE_OTHER): Payer: Medicare PPO | Admitting: NURSE PRACTITIONER

## 2022-11-18 VITALS — BP 154/73 | HR 57 | Ht 63.0 in | Wt 177.2 lb

## 2022-11-18 DIAGNOSIS — I10 Essential (primary) hypertension: Secondary | ICD-10-CM

## 2022-11-18 DIAGNOSIS — R600 Localized edema: Secondary | ICD-10-CM

## 2022-11-18 DIAGNOSIS — R0602 Shortness of breath: Secondary | ICD-10-CM

## 2022-11-18 DIAGNOSIS — R001 Bradycardia, unspecified: Secondary | ICD-10-CM

## 2022-11-18 DIAGNOSIS — I2510 Atherosclerotic heart disease of native coronary artery without angina pectoris: Secondary | ICD-10-CM

## 2022-11-18 DIAGNOSIS — R9431 Abnormal electrocardiogram [ECG] [EKG]: Secondary | ICD-10-CM

## 2022-11-18 DIAGNOSIS — I5030 Unspecified diastolic (congestive) heart failure: Secondary | ICD-10-CM

## 2022-11-18 NOTE — Progress Notes (Signed)
Cardiology Clinic [facility]    Name: Connie Barber  Age: 88 y.o.  Date of Service: 11/18/2022    Primary Care Provider: Sharlene Motts, MD  Chief Complaint:   Chief Complaint   Patient presents with    Heart Disease    Hypertension    Hyperlipidemia    Follow Up 6 Months       Subjective:  The patient has a history of CAD. In Len[place] Marmo 2016 she underwent cardiac catheterization due to abnormal stress test which revealed a small to moderate apical reversible perfusion defect. Cardiac catheterization was then performed and this revealed EF 50% with maybe some slight basal inferior akinesis and some questionable anterior hypokinesis. The right coronary artery was a small nondominant vessel. LAD was a large vessel wrapped around the apex and had 50% mid stenosis. Left circumflex was a dominant vessel appeared to be angiographically normal. The patient has echocardiogram in October 2015, which was unremarkable with normal LV function. No significant valvular disease. Holter recording has been okay. Lipids have been well controlled. Despite this, was started on low dose statin due to her moderate CAD. Repeat echocardiogram in December 2016 revealed EF of 55 to 60%. Normal study.  Repeat echocardiogram on 09-17-20 which showed normal LV function, EF 60%.  There is moderate diastolic dysfunction.  Mild LVH and mild left atrial enlargement.     05-10-21 The patient is here for routine follow-up for CAD.  She denies any chest pains.  She does have some shortness of breath with exertion, but this is unchanged.  She is tolerating medications well.  Blood pressure was elevated today.  She states her blood pressure cuff at home broke so she has not had 1, but she decided that when sent to her.  She states previously her blood pressure usually stays in the 130s systolic.  She did have labs last month which looked really good.  Her A1c had improved to 6.5, total cholesterol 135, triglycerides 52, HDL 44, LDL 81, BUN  27, creatinine 1.5, sodium 143, potassium 4.2.    11/19/2021: The patient is here for routine follow up for CAD.  She denies any chest pain or palpitations.  She does report she has shortness of breath chronically if common stairs otherwise denies any PND or orthopnea.  She does have bilateral lower extremity edema that she says comes and goes in regards to worsening.  She is diabeti,  last known A1c was 6.5.  The blood pressure is elevated today however home readings have been 130s to 140s most of the time.  She had recent labs with her primary care however I do not have a copy of those reports at this time.    05/20/22 The patient is here for CAD f/u.  She denies any chest pains.  She does get shortness of breath going up stairs, but this is unchanged for her.  She is tolerating medications well blood pressure was elevated today, she was checking.  She states she is supposed to be getting a new blood pressure cuff.  Her swelling in her legs has been stable.  She does notice if she eats more salt or keeps her legs down in his worse.  She has not been taking fluid medicine though.  If she props her legs up for swelling usually improves.    11/18/2022: The patient is here for CAD follow up.  She denies any chest pain or palpitations.  She does get short of breath when she  walks up stairs this is chronic and unchanged.  Tolerating medications without difficulty.  Blood pressure is elevated arrival however prior to leaving home it was 138/70 she does check regularly.  She does have chronic lower extremity edema that worsened throughout the day and resolve overnight or elevation.  She does continue to follow with Nephrology for chronic kidney disease.  No new concerns voiced.    Past Medical History:  Past Medical History:   Diagnosis Date    CKD (chronic kidney disease)     Stage 3b    Coronary artery disease     Diabetes mellitus (CMS HCC)     Dyslipidemia     Essential hypertension     Hepatitis C          Social  History:  Social History     Tobacco Use   Smoking Status Never   Smokeless Tobacco Never      Social History     Substance and Sexual Activity   Alcohol Use Never      Social History     Substance and Sexual Activity   Drug Use Never      Current Medications:  Current Outpatient Medications   Medication Sig    amLODIPine (NORVASC) 10 mg Oral Tablet Take 1 Tablet (10 mg total) by mouth Once a day for 90 days    aspirin (ECOTRIN) 81 mg Oral Tablet, Delayed Release (E.C.) Take 1 Tablet (81 mg total) by mouth Once a day    atenoloL (TENORMIN) 25 mg Oral Tablet Take 2 Tablets (50 mg total) by mouth Once a day for 180 days (Patient taking differently: Take 1 Tablet (25 mg total) by mouth Once a day)    calcitrioL (ROCALTROL) 0.25 mcg Oral Capsule Take 1 Capsule (0.25 mcg total) by mouth Once a day    Colchicine-Probenecid 500-0.5 mg Oral Tablet Take 1 Tablet by mouth Twice daily with food    cyanocobalamin (VITAMIN B 12) 1,000 mcg Oral Tablet Take 1 Tablet (1,000 mcg total) by mouth Once a day    ergocalciferol, vitamin D2, (DRISDOL) 1,250 mcg (50,000 unit) Oral Capsule Take 1 Capsule (50,000 Units total) by mouth Every 7 days    ezetimibe (ZETIA) 10 mg Oral Tablet Take 1 Tablet (10 mg total) by mouth Every morning for 180 days    glimepiride (AMARYL) 1 mg Oral Tablet Take 1 Tablet (1 mg total) by mouth Every morning with breakfast    glimepiride (AMARYL) 2 mg Oral Tablet Take 2 Tablets (4 mg total) by mouth Every morning with breakfast    levothyroxine (SYNTHROID) 75 mcg Oral Tablet Take 1 Tablet (75 mcg total) by mouth Every morning    magnesium oxide (MAG-OX) 400 mg Oral Tablet Take 1 Tablet (400 mg total) by mouth Once a day (Patient not taking: Reported on 11/18/2022)    olmesartan (BENICAR) 40 mg Oral Tablet Take 1 Tablet (40 mg total) by mouth Once a day for 360 days Indications: chronic kidney disease with albuminuria    SITagliptin phosphate (JANUVIA) 50 mg Oral Tablet Take 1 Tablet (50 mg total) by mouth Once a  day     Allergies:  No Known Allergies   Review of Systems:  Complete ROS was performed and otherwise negative unless noted in HPI.    Vital Signs:  Vitals:    11/18/22 1346 11/18/22 1347   BP: (!) 157/74 (!) 154/73   Pulse: 60 57   SpO2: 96%    Weight: 80.4 kg (177  lb 4 oz)    Height: 1.6 m (5\' 3" )    BMI: 31.46       Physical Exam:  General: Pt resting comfortably in no acute distress and appears stated age.    Neck: No JVD, no carotid bruit. Neck supple, symmetrical, trachea midline.   Lungs:  Normal respiratory effort, lungs clear to auscultation bilaterally.    Cardiovascular:  Regular rate and rhythm.  Normal S1 and S2 without murmur, gallop, or rub.  Abdomen: Soft, non-tender and bowel sounds normal.    Extremities: Extremities bilateral lower extremity edema  Neurologic: Alert and oriented x3.     Assessment:    Coronary atherosclerosis of native coronary artery    Heart failure with preserved ejection fraction, unspecified HF chronicity (CMS HCC)    Essential hypertension    SOB (shortness of breath)      Plan:   Patient is stable from cardiac standpoint.   She is also only taking Zetia for statin, she was unable to tolerate other statin drugs. Her dyspnea is stable.   I recommend she keep a log of her blood pressure and take this into her PCP at next visit to see if further adjustments to be made.  Return in 6 months for routine follow-up.    Orders placed this visit:  Orders Placed This Encounter    EKG (In-Clinic Today)       [place] is to return to clinic for follow up with the understanding that should symptoms change or worsen she is to call the office or go to the closest emergency department for evaluation.    Doug Bucklin A. Luiza Carranco, APRN,FNP-BC    A portion of this documentation may have been generated using MMODAL voice recognition software and may contain syntax/voice recognition errors.

## 2022-11-25 ENCOUNTER — Inpatient Hospital Stay (HOSPITAL_COMMUNITY): Payer: Medicare PPO | Admitting: Student in an Organized Health Care Education/Training Program

## 2022-11-25 ENCOUNTER — Inpatient Hospital Stay (HOSPITAL_COMMUNITY): Payer: Medicare PPO

## 2022-11-25 ENCOUNTER — Observation Stay
Admission: EM | Admit: 2022-11-25 | Discharge: 2022-11-26 | Disposition: A | Discharge status: Home / Self Care | Payer: Medicare PPO | Attending: Student in an Organized Health Care Education/Training Program | Admitting: Student in an Organized Health Care Education/Training Program

## 2022-11-25 ENCOUNTER — Encounter (HOSPITAL_BASED_OUTPATIENT_CLINIC_OR_DEPARTMENT_OTHER): Payer: Self-pay

## 2022-11-25 ENCOUNTER — Emergency Department (HOSPITAL_BASED_OUTPATIENT_CLINIC_OR_DEPARTMENT_OTHER): Payer: Medicare PPO

## 2022-11-25 ENCOUNTER — Other Ambulatory Visit: Payer: Self-pay

## 2022-11-25 DIAGNOSIS — R0902 Hypoxemia: Principal | ICD-10-CM | POA: Diagnosis present

## 2022-11-25 DIAGNOSIS — I5020 Unspecified systolic (congestive) heart failure: Secondary | ICD-10-CM

## 2022-11-25 DIAGNOSIS — I129 Hypertensive chronic kidney disease with stage 1 through stage 4 chronic kidney disease, or unspecified chronic kidney disease: Secondary | ICD-10-CM | POA: Diagnosis present

## 2022-11-25 DIAGNOSIS — N184 Chronic kidney disease, stage 4 (severe): Secondary | ICD-10-CM | POA: Diagnosis present

## 2022-11-25 DIAGNOSIS — K529 Noninfective gastroenteritis and colitis, unspecified: Secondary | ICD-10-CM

## 2022-11-25 DIAGNOSIS — N189 Chronic kidney disease, unspecified: Secondary | ICD-10-CM

## 2022-11-25 DIAGNOSIS — D631 Anemia in chronic kidney disease: Secondary | ICD-10-CM | POA: Diagnosis present

## 2022-11-25 DIAGNOSIS — B1920 Unspecified viral hepatitis C without hepatic coma: Secondary | ICD-10-CM | POA: Diagnosis present

## 2022-11-25 DIAGNOSIS — E860 Dehydration: Secondary | ICD-10-CM

## 2022-11-25 DIAGNOSIS — N2581 Secondary hyperparathyroidism of renal origin: Secondary | ICD-10-CM | POA: Diagnosis present

## 2022-11-25 DIAGNOSIS — I2510 Atherosclerotic heart disease of native coronary artery without angina pectoris: Secondary | ICD-10-CM | POA: Diagnosis present

## 2022-11-25 DIAGNOSIS — J9611 Chronic respiratory failure with hypoxia: Secondary | ICD-10-CM

## 2022-11-25 DIAGNOSIS — R1013 Epigastric pain: Secondary | ICD-10-CM

## 2022-11-25 DIAGNOSIS — R001 Bradycardia, unspecified: Principal | ICD-10-CM | POA: Diagnosis present

## 2022-11-25 DIAGNOSIS — Z9049 Acquired absence of other specified parts of digestive tract: Secondary | ICD-10-CM

## 2022-11-25 DIAGNOSIS — K567 Ileus, unspecified: Secondary | ICD-10-CM | POA: Diagnosis present

## 2022-11-25 DIAGNOSIS — Z79890 Hormone replacement therapy (postmenopausal): Secondary | ICD-10-CM

## 2022-11-25 DIAGNOSIS — R9431 Abnormal electrocardiogram [ECG] [EKG]: Secondary | ICD-10-CM | POA: Insufficient documentation

## 2022-11-25 DIAGNOSIS — E785 Hyperlipidemia, unspecified: Secondary | ICD-10-CM | POA: Diagnosis present

## 2022-11-25 DIAGNOSIS — Z8249 Family history of ischemic heart disease and other diseases of the circulatory system: Secondary | ICD-10-CM

## 2022-11-25 DIAGNOSIS — Z8711 Personal history of peptic ulcer disease: Secondary | ICD-10-CM

## 2022-11-25 DIAGNOSIS — E1122 Type 2 diabetes mellitus with diabetic chronic kidney disease: Secondary | ICD-10-CM | POA: Diagnosis present

## 2022-11-25 DIAGNOSIS — Z7984 Long term (current) use of oral hypoglycemic drugs: Secondary | ICD-10-CM

## 2022-11-25 HISTORY — DX: Presence of spectacles and contact lenses: Z97.3

## 2022-11-25 HISTORY — DX: Presence of dental prosthetic device (complete) (partial): Z97.2

## 2022-11-25 LAB — MANUAL DIFFERENTIAL
EOSINOPHIL %: 2 % (ref 0–7)
EOSINOPHIL ABSOLUTE: 0.11 10*3/uL (ref 0.00–0.80)
EOSINOPHILS MANUAL: 2
LYMPHOCYTE %: 30 % (ref 25–45)
LYMPHOCYTE ABSOLUTE: 1.62 10*3/uL (ref 1.10–5.00)
LYMPHOCYTES MANUAL: 30
MONOCYTE %: 6 % (ref 0–12)
MONOCYTE ABSOLUTE: 0.32 10*3/uL (ref 0.00–1.30)
MONOCYTES MANUAL: 6
NEUTROPHIL %: 62 % (ref 40–76)
NEUTROPHIL ABSOLUTE: 3.35 10*3/uL (ref 1.80–8.40)
NEUTROPHILS MANUAL: 62
PLATELET MORPHOLOGY COMMENT: ADEQUATE
TOTAL CELLS COUNTED [#] IN BLOOD: 100
WBC: 5.4 10*3/uL

## 2022-11-25 LAB — URINALYSIS, MACRO/MICRO
BILIRUBIN: NEGATIVE mg/dL
GLUCOSE: NEGATIVE mg/dL
KETONES: NEGATIVE mg/dL
LEUKOCYTES: NEGATIVE WBCs/uL
NITRITE: NEGATIVE
PH: 6 (ref 4.6–8.0)
PROTEIN: NEGATIVE mg/dL
SPECIFIC GRAVITY: 1.02 (ref 1.003–1.035)
UROBILINOGEN: 0.2 mg/dL (ref 0.2–1.0)

## 2022-11-25 LAB — BLUE TOP TUBE

## 2022-11-25 LAB — COMPREHENSIVE METABOLIC PANEL, NON-FASTING
ALBUMIN/GLOBULIN RATIO: 0.9 (ref 0.8–1.4)
ALBUMIN: 3.1 g/dL — ABNORMAL LOW (ref 3.4–5.0)
ALKALINE PHOSPHATASE: 93 U/L (ref 46–116)
ALT (SGPT): 18 U/L (ref <=78)
ANION GAP: 7 mmol/L (ref 4–13)
AST (SGOT): 18 U/L (ref 15–37)
BILIRUBIN TOTAL: 0.3 mg/dL (ref 0.2–1.0)
BUN/CREA RATIO: 15
BUN: 24 mg/dL — ABNORMAL HIGH (ref 7–18)
CALCIUM, CORRECTED: 9.3 mg/dL
CALCIUM: 8.6 mg/dL (ref 8.5–10.1)
CHLORIDE: 109 mmol/L — ABNORMAL HIGH (ref 98–107)
CO2 TOTAL: 30 mmol/L (ref 21–32)
CREATININE: 1.57 mg/dL — ABNORMAL HIGH (ref 0.55–1.02)
ESTIMATED GFR: 32 mL/min/{1.73_m2} — ABNORMAL LOW (ref >59)
GLOBULIN: 3.6
GLUCOSE: 147 mg/dL — ABNORMAL HIGH (ref 74–106)
OSMOLALITY, CALCULATED: 297 mOsm/kg — ABNORMAL HIGH (ref 270–290)
POTASSIUM: 4.1 mmol/L (ref 3.5–5.1)
PROTEIN TOTAL: 6.7 g/dL (ref 6.4–8.2)
SODIUM: 146 mmol/L — ABNORMAL HIGH (ref 136–145)

## 2022-11-25 LAB — URINALYSIS, MICROSCOPIC

## 2022-11-25 LAB — CBC WITH DIFF
HCT: 36.8 % — ABNORMAL LOW (ref 37.0–47.0)
HGB: 11.7 g/dL — ABNORMAL LOW (ref 12.5–16.0)
MCH: 28.9 pg (ref 27.0–32.0)
MCHC: 31.8 g/dL — ABNORMAL LOW (ref 32.0–36.0)
MCV: 91.2 fL (ref 78.0–99.0)
MPV: 10.1 fL (ref 7.4–10.4)
PLATELETS: 169 10*3/uL (ref 140–440)
RBC: 4.04 10*6/uL — ABNORMAL LOW (ref 4.20–5.40)
RDW: 16.4 % — ABNORMAL HIGH (ref 11.6–14.8)
WBC: 5.4 10*3/uL (ref 4.0–10.5)

## 2022-11-25 LAB — THYROID STIMULATING HORMONE WITH FREE T4 REFLEX: TSH: 4.45 u[IU]/mL (ref 0.450–5.330)

## 2022-11-25 LAB — ARTERIAL BLOOD GAS/LACTATE
%FIO2 (ARTERIAL): 21 %
BASE DEFICIT: 2.2 mmol/L — ABNORMAL HIGH (ref 0.0–2.0)
BICARBONATE (ARTERIAL): 22.1 mmol/L (ref 20.0–26.0)
CARBOXYHEMOGLOBIN: 1.5 % (ref <=1.5)
LACTATE: 1.1 mmol/L (ref <=2.0)
MET-HEMOGLOBIN: 0.7 % (ref <=2.0)
O2CT: 13.5 %
OXYHEMOGLOBIN: 84.5 % — CL (ref 88.0–100.0)
PCO2 (ARTERIAL): 49 mm/Hg — ABNORMAL HIGH (ref 35–45)
PH (ARTERIAL): 7.3 — ABNORMAL LOW (ref 7.35–7.45)
PO2 (ARTERIAL): 53 mm/Hg — ABNORMAL LOW (ref 80–100)

## 2022-11-25 LAB — TROPONIN-I
TROPONIN I: 11 ng/L (ref <15)
TROPONIN I: 10 ng/L (ref <15)
TROPONIN I: 11 ng/L (ref <15)

## 2022-11-25 LAB — PTT (PARTIAL THROMBOPLASTIN TIME): APTT: 27.1 seconds (ref 22.0–31.7)

## 2022-11-25 LAB — PT/INR
INR: 1.06 (ref 0.88–1.10)
PROTHROMBIN TIME: 12.4 seconds (ref 9.8–12.7)

## 2022-11-25 LAB — LIPASE: LIPASE: 20 U/L (ref 16–77)

## 2022-11-25 MED ORDER — PANTOPRAZOLE 40 MG INTRAVENOUS SOLUTION
INTRAVENOUS | Status: AC
Start: 2022-11-25 — End: 2022-11-25
  Filled 2022-11-25: qty 10

## 2022-11-25 MED ORDER — FAMOTIDINE (PF) 20 MG/2 ML INTRAVENOUS SOLUTION
INTRAVENOUS | Status: AC
Start: 2022-11-25 — End: 2022-11-25
  Filled 2022-11-25: qty 2

## 2022-11-25 MED ORDER — HEPARIN (PORCINE) 5,000 UNIT/ML INJECTION SOLUTION
5000.0000 [IU] | Freq: Three times a day (TID) | INTRAMUSCULAR | Status: DC
Start: 2022-11-25 — End: 2022-11-26
  Administered 2022-11-25 – 2022-11-26 (×2): 5000 [IU] via SUBCUTANEOUS
  Filled 2022-11-25 (×2): qty 1

## 2022-11-25 MED ORDER — ACETAMINOPHEN 325 MG TABLET
650.0000 mg | ORAL_TABLET | ORAL | Status: DC | PRN
Start: 2022-11-25 — End: 2022-11-26

## 2022-11-25 MED ORDER — FUROSEMIDE 10 MG/ML INJECTION SOLUTION
40.0000 mg | Freq: Two times a day (BID) | INTRAMUSCULAR | Status: DC
Start: 2022-11-25 — End: 2022-11-26
  Administered 2022-11-25: 0 mg via INTRAVENOUS
  Administered 2022-11-26: 40 mg via INTRAVENOUS
  Filled 2022-11-25 (×2): qty 4

## 2022-11-25 MED ORDER — ONDANSETRON HCL (PF) 4 MG/2 ML INJECTION SOLUTION
INTRAMUSCULAR | Status: AC
Start: 2022-11-25 — End: 2022-11-25
  Filled 2022-11-25: qty 2

## 2022-11-25 MED ORDER — SODIUM CHLORIDE 0.9 % IV BOLUS
1000.0000 mL | INJECTION | Status: AC
Start: 2022-11-25 — End: 2022-11-25
  Administered 2022-11-25: 0 mL via INTRAVENOUS
  Administered 2022-11-25: 1000 mL via INTRAVENOUS

## 2022-11-25 MED ORDER — IPRATROPIUM 0.5 MG-ALBUTEROL 3 MG (2.5 MG BASE)/3 ML NEBULIZATION SOLN
3.0000 mL | INHALATION_SOLUTION | Freq: Four times a day (QID) | RESPIRATORY_TRACT | Status: DC
Start: 2022-11-25 — End: 2022-11-26
  Administered 2022-11-25 (×2): 0 mL via RESPIRATORY_TRACT
  Administered 2022-11-26: 3 mL via RESPIRATORY_TRACT

## 2022-11-25 MED ORDER — IPRATROPIUM 0.5 MG-ALBUTEROL 3 MG (2.5 MG BASE)/3 ML NEBULIZATION SOLN
3.0000 mL | INHALATION_SOLUTION | RESPIRATORY_TRACT | Status: DC | PRN
Start: 2022-11-25 — End: 2022-11-26

## 2022-11-25 MED ORDER — ONDANSETRON HCL (PF) 4 MG/2 ML INJECTION SOLUTION
4.0000 mg | Freq: Four times a day (QID) | INTRAMUSCULAR | Status: DC | PRN
Start: 2022-11-25 — End: 2022-11-26

## 2022-11-25 MED ORDER — SODIUM CHLORIDE 0.9 % (FLUSH) INJECTION SYRINGE
10.0000 mL | INJECTION | INTRAMUSCULAR | Status: DC
Start: 2022-11-25 — End: 2022-11-26
  Administered 2022-11-25: 0 mL via INTRAVENOUS

## 2022-11-25 MED ORDER — PANTOPRAZOLE 40 MG INTRAVENOUS SOLUTION
40.0000 mg | INTRAVENOUS | Status: AC
Start: 2022-11-25 — End: 2022-11-25
  Administered 2022-11-25: 40 mg via INTRAVENOUS

## 2022-11-25 MED ORDER — ONDANSETRON HCL (PF) 4 MG/2 ML INJECTION SOLUTION
4.0000 mg | INTRAMUSCULAR | Status: AC
Start: 2022-11-25 — End: 2022-11-25
  Administered 2022-11-25: 4 mg via INTRAVENOUS

## 2022-11-25 MED ORDER — DOXYCYCLINE HYCLATE 100 MG TABLET
100.0000 mg | ORAL_TABLET | Freq: Two times a day (BID) | ORAL | Status: DC
Start: 2022-11-25 — End: 2022-11-26
  Administered 2022-11-25: 0 mg via ORAL
  Administered 2022-11-26: 100 mg via ORAL
  Filled 2022-11-25 (×2): qty 1

## 2022-11-25 MED ORDER — FAMOTIDINE (PF) 20 MG/2 ML INTRAVENOUS SOLUTION
20.0000 mg | INTRAVENOUS | Status: AC
Start: 2022-11-25 — End: 2022-11-25
  Administered 2022-11-25: 20 mg via INTRAVENOUS

## 2022-11-25 NOTE — ED Triage Notes (Signed)
Patient states started this morning epigastric pain. Patient states she feels like she could vomit but cannot.

## 2022-11-25 NOTE — ED Nurses Note (Signed)
Patient's O2 saturation noted to drop as low as 84% on room air with good pleth. O2 saturation quickly increases back to 93-95%. Dr. Ermalinda Memos made aware.

## 2022-11-25 NOTE — Care Plan (Signed)
Problem: Adult Inpatient Plan of Care  Goal: Plan of Care Review  Outcome: Ongoing (see interventions/notes)  Goal: Patient-Specific Goal (Individualized)  Outcome: Ongoing (see interventions/notes)  Goal: Absence of Hospital-Acquired Illness or Injury  Outcome: Ongoing (see interventions/notes)  Goal: Optimal Comfort and Wellbeing  Outcome: Ongoing (see interventions/notes)     Problem: Fall Injury Risk  Goal: Absence of Fall and Fall-Related Injury  Outcome: Ongoing (see interventions/notes)

## 2022-11-25 NOTE — H&P (Signed)
11/25/2022  16:01  Connie Barber  [id number]    Chief Complaint: Epigastric discomfort    History:  This is an 88 year old female with past medical history of chronic kidney disease stage IIIB, coronary disease with a history of stenting but angioplasty in the past, diabetes, hyperlipidemia, hypertension, presented to the hospital after eating a couple hot dogs and onions last night and then waking up this morning with epigastric discomfort and feeling like she needed to throw up.  She never did have any episodes of vomiting, and eventually the discomfort went away after she went to the hospital.  Upon arrival to the ER, patient had troponins that were within normal limits.  She was found to have sinus bradycardia but overall asymptomatic.  Patient also had episodes of hypoxia.  She endorses worsening allergy symptoms this year than before but no history of chronic obstructive pulmonary disease or asthma.  Chest x-ray had not initially been obtained so are waiting on the results here but I am concern for potential CHF exacerbation given findings of significant peripheral edema on exam.  Patient is established with Dr. Elesa Massed and requesting to see him here in the hospital.      Review of systems:  10 point review of systems was reviewed and negative except as pertinent negatives and positives mentioned in the interval history above.    Past Medical History  Colchicine-Probenecid, SITagliptin phosphate, amLODIPine, aspirin, atenoloL, calcitrioL, cyanocobalamin, ergocalciferol (vitamin D2), ezetimibe, glimepiride, levothyroxine, and olmesartan   No Known Allergies  Past Medical History:   Diagnosis Date    CKD (chronic kidney disease)     Stage 3b    Coronary artery disease     Diabetes mellitus (CMS HCC)     Dyslipidemia     Essential hypertension     Hepatitis C     Wears dentures     Wears glasses          Past Surgical History:   Procedure Laterality Date    CARDIAC CATHETERIZATION  11/20/2014    HX APPENDECTOMY       HX HYSTERECTOMY           Family Medical History:       Problem Relation (Age of Onset)    Heart Disease Mother    Hypertension (High Blood Pressure) Mother    No Known Problems Sister, Brother, Maternal Grandmother, Maternal Grandfather, Paternal Grandmother, Paternal Grandfather, Daughter, Son, Maternal Aunt, Maternal Uncle, Paternal Aunt, Paternal Uncle, Other    Stroke Father            Social History     Socioeconomic History    Marital status: Widowed   Tobacco Use    Smoking status: Never    Smokeless tobacco: Never   Vaping Use    Vaping status: Never Used   Substance and Sexual Activity    Alcohol use: Not Currently    Drug use: Never     Social Determinants of Health     Social Connections: Low Risk  (11/25/2022)    Social Connections     SDOH Social Isolation: 5 or more times a week       Physical exam:  BP 139/65   Pulse 51   Temp 36.2 C (97.2 F)   Resp 18   Ht 1.6 m (5\' 3" )   Wt 84 kg (185 lb 2 oz)   SpO2 92%   BMI 32.79 kg/m       Gen: This is a 88  y.o.  Year-old female who is awake alert and oriented x3 in no acute distress  CV:  Bradycardic rate with Regular rhythm without murmurs rubs or gallops.  2+ pedal and radial pulses. No clubbing or cyanosis. 2+ pedal edema  RESP:  Clear to auscultation bilaterally without wheezes rales or rhonchi.  Respirations are nonlabored.  GI:  Abdomen is soft, nontender, nondistended.  Bowel sounds normoactive.  GU:  No Foley is present  MSK:  Full range of motion of upper and lower extremities  NEURO:  Cranial nerves 2-12 grossly intact.  No focal deficits as tested.  SKIN: Warm, dry, intact, without lesions, rashes, or ulcerations      Diagnostic Labs:  Results for orders placed or performed during the hospital encounter of 11/25/22 (from the past 24 hour(s))   CBC/DIFF    Narrative    The following orders were created for panel order CBC/DIFF.  Procedure                               Abnormality         Status                     ---------                                -----------         ------                     CBC WITH DIFF[[id number]]                Abnormal            Final result               MANUAL DIFFERENTIAL[[id number]]                              Final result                 Please view results for these tests on the individual orders.   COMPREHENSIVE METABOLIC PANEL, NON-FASTING   Result Value Ref Range    SODIUM 146 (H) 136 - 145 mmol/L    POTASSIUM 4.1 3.5 - 5.1 mmol/L    CHLORIDE 109 (H) 98 - 107 mmol/L    CO2 TOTAL 30 21 - 32 mmol/L    ANION GAP 7 4 - 13 mmol/L    BUN 24 (H) 7 - 18 mg/dL    CREATININE 1.61 (H) 0.55 - 1.02 mg/dL    BUN/CREA RATIO 15     ESTIMATED GFR 32 (L) >59 mL/min/1.33m^2    ALBUMIN 3.1 (L) 3.4 - 5.0 g/dL    CALCIUM 8.6 8.5 - 09.6 mg/dL    GLUCOSE 045 (H) 74 - 106 mg/dL    ALKALINE PHOSPHATASE 93 46 - 116 U/L    ALT (SGPT) 18 <=78 U/L    AST (SGOT) 18 15 - 37 U/L    BILIRUBIN TOTAL 0.3 0.2 - 1.0 mg/dL    PROTEIN TOTAL 6.7 6.4 - 8.2 g/dL    ALBUMIN/GLOBULIN RATIO 0.9 0.8 - 1.4    OSMOLALITY, CALCULATED 297 (H) 270 - 290 mOsm/kg    CALCIUM, CORRECTED 9.3 mg/dL    GLOBULIN 3.6     Narrative    Estimated  Glomerular Filtration Rate (eGFR) is calculated using the CKD-EPI (2021) equation, intended for patients 8 years of age and older. If gender is not documented or "unknown", there will be no eGFR calculation.   LIPASE   Result Value Ref Range    LIPASE 20 16 - 77 U/L   PT/INR   Result Value Ref Range    PROTHROMBIN TIME 12.4 9.8 - 12.7 seconds    INR 1.06 0.88 - 1.10    Narrative    INR OF 2.0-3.0  RECOMMENDED FOR: PROPHYLAXIS/TREATMENT OF VENEOUS THROMBOSIS, PULMONARY EMBOLISM, PREVENTION OF SYSTEMIC EMBOLISM FROM ATRIAL FIBRILATION, MYOCARDIAL INFARCTION.    INR OF 2.5-3.5  RECOMMENDED FOR MECHANICAL PROSTHETIC HEART VALVES, RECURRENT SYSTEMIC EMBOLISM, RECURRENT MYOCARDIAL INFARCTION.     PTT (PARTIAL THROMBOPLASTIN TIME)   Result Value Ref Range    APTT 27.1 22.0 - 31.7 seconds   URINALYSIS WITH REFLEX MICROSCOPIC AND CULTURE IF  POSITIVE    Specimen: Urine, Site not specified    Narrative    The following orders were created for panel order URINALYSIS WITH REFLEX MICROSCOPIC AND CULTURE IF POSITIVE.  Procedure                               Abnormality         Status                     ---------                               -----------         ------                     URINALYSIS, MACRO/MICRO[[id number]]      Abnormal            Final result               URINALYSIS, MICROSCOPIC[[id number]]      Abnormal            Final result                 Please view results for these tests on the individual orders.   CBC WITH DIFF   Result Value Ref Range    WBC 5.4 4.0 - 10.5 x10^3/uL    RBC 4.04 (L) 4.20 - 5.40 x10^6/uL    HGB 11.7 (L) 12.5 - 16.0 g/dL    HCT 54.0 (L) 98.1 - 47.0 %    MCV 91.2 78.0 - 99.0 fL    MCH 28.9 27.0 - 32.0 pg    MCHC 31.8 (L) 32.0 - 36.0 g/dL    RDW 19.1 (H) 47.8 - 14.8 %    PLATELETS 169 140 - 440 x10^3/uL    MPV 10.1 7.4 - 10.4 fL   URINALYSIS, MACRO/MICRO   Result Value Ref Range    COLOR Light Yellow (A) Yellow    APPEARANCE Slightly Hazy (A) Clear    SPECIFIC GRAVITY 1.020 1.003 - 1.035    PH 6.0 4.6 - 8.0    LEUKOCYTES Negative Negative WBCs/uL    NITRITE Negative Negative    PROTEIN Negative Negative mg/dL    GLUCOSE Negative Negative mg/dL    KETONES Negative Negative mg/dL    BILIRUBIN Negative Negative mg/dL    BLOOD Small (A) Negative mg/dL    UROBILINOGEN 0.2 0.2 -  1.0 mg/dL   MANUAL DIFFERENTIAL   Result Value Ref Range    WBC 5.4 x10^3/uL    NEUTROPHIL % 62 40 - 76 %    LYMPHOCYTE % 30 25 - 45 %    MONOCYTE % 6 0 - 12 %    EOSINOPHIL % 2 0 - 7 %    BASOPHIL %      METAMYELOCYTE %      MYELOCYTE %      PROMYELOCYTE %      BAND %      BLAST %      OTHER %      NEUTROPHIL ABSOLUTE 3.35 1.80 - 8.40 x10^3/uL    LYMPHOCYTE ABSOLUTE 1.62 1.10 - 5.00 x10^3/uL    MONOCYTE ABSOLUTE 0.32 0.00 - 1.30 x10^3/uL    EOSINOPHIL ABSOLUTE 0.11 0.00 - 0.80 x10^3/uL    BASOPHIL ABSOLUTE      METAMYELOCYTE ABSOLUTE      MYELOCYTE  ABSOLUTE      PROMYELOCYTE ABSOLUTE      BLAST ABSOLUTE      OTHER CELL ABSOLUTE      ANISOCYTOSIS      POLYCHROMASIA      POIKILOCYTOSIS      BASOPHILIC STIPPLING      MICROCYTOSIS      MACROCYTOSIS      ROULEAUX      SCHISTOCYTES      SPHEROCYTES      TARGET CELLS      TEARDROP CELLS      OVALOCYTE (ELLIPTOCYTE)      CRENATED RED CELLS      STOMATOCYTES      ACANTHOCYTES (SPUR CELL)      ECHINOCYTE (BURR CELL)      BLISTER CELLS      RBC AGGLUTINATES      HOWELL JOLLY BODIES      ATYPICAL LYMPHOCYTES      TOXIC GRANULATION      DOHLE BODIES      TOXIC VACUOLIZATION      AUER RODS      BASKET CELLS      HYPERSEGMENTATION      LARGE PLATELETS      PLATELET CLUMPS      PLATELET MORPHOLOGY COMMENT Adequate     BANDS NEUTROPHILS MANUAL      BAND ABSOLUTE      NEUTROPHILS MANUAL 62     LYMPHOCYTES MANUAL 30     MONOCYTES MANUAL 6     EOSINOPHILS MANUAL 2     BASOPHILS MANUAL      PROMYELOCYTES MANUAL      MYELOCYTES MANUAL      METAMYELOCYTES MANUAL      BLASTS MANUAL      TOTAL CELLS COUNTED [#] IN BLOOD 100     OTHER CELLS MANUAL      NUCLEATED RBC MANUAL      PLASMA CELL %      PLASMA CELL ABSOLUE      PLASMA CELLS MANUAL      HYPOCHROMASIA     TROPONIN NOW   Result Value Ref Range    TROPONIN I 11 <15 ng/L    Narrative    Values received on females ranging between 12-15 ng/L MUST include the next serial troponin to review changes in the delta differences as the reference range for the Access II chemistry analyzer is lower than the established reference range.     TROPONIN IN ONE HOUR   Result Value Ref  Range    TROPONIN I 10 <15 ng/L    Narrative    Values received on females ranging between 12-15 ng/L MUST include the next serial troponin to review changes in the delta differences as the reference range for the Access II chemistry analyzer is lower than the established reference range.     TROPONIN IN THREE HOURS   Result Value Ref Range    TROPONIN I 11 <15 ng/L    Narrative    Values received on females ranging  between 12-15 ng/L MUST include the next serial troponin to review changes in the delta differences as the reference range for the Access II chemistry analyzer is lower than the established reference range.     URINALYSIS, MICROSCOPIC   Result Value Ref Range    RBCS 3-5 (A) 0-3, Not Present /hpf    BACTERIA Few (A) Negative /hpf    MUCOUS Moderate (A) (none) /hpf    WBCS 0-5 Not Present, Occasional, 0-5 /hpf    SQUAMOUS EPITHELIAL Several (A) Not Present, Few /hpf       Diagnostic Imaging:  XR ABD FLAT AND UPRIGHT SERIES (W PA CHEST)   Final Result   Mild ileus. 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)  (CMS HCC)    Hypomagnesemia    Vitamin D deficiency    Essential hypertension    Diabetes mellitus (CMS HCC)    Secondary hyperparathyroidism of renal origin (CMS HCC)    Symptomatic bradycardia      Asymptomatic Bradycardia  Sinus brady on ECG  Trop WNL  TSH pending  Echo pending    Epigastric discomfort  Suspect Gi in nature  Resolved    Acute CHF exacerbation/Acute Hypoxic Resp Failure  ABG pending  CXR pending    DVT Prophylaxis: Heparin  Diet: Cardiac    CODE: FULL    On the day of the encounter, a total of  65 minutes was spent on this patient encounter including review of historical information, examination, documentation and post-visit activities. The time documented excludes procedural time.      Ebbie Ridge, DO

## 2022-11-25 NOTE — ED Nurses Note (Signed)
Report called to [place] on 4 east at [facility]. [facility] in room for transport at this time.

## 2022-11-25 NOTE — ED Provider Notes (Signed)
[facility], [facility] Emergency Department  ED Primary Provider Note  History of Present Illness   Chief Complaint   Patient presents with    Epigastric Pain     Connie Barber is a 88 y.o. female who had concerns including Epigastric Pain.  Arrival: The patient arrived by Car complaining epigastric pain nonradiating since last night.  Patient ate dinner and then several hours later started having gurgling in her stomach and nausea.  Patient states she has not vomited at all.  She denies any diarrhea.  Fact she states that she has had normal bowel movements.  Patient is complaining of epigastric pain.  She denies any black or bloody stools.  She states having peptic ulcer in the past.  Patient also has hypertension and diabetes.  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  Patient does have coronary artery disease as well as hyperlipidemia.  She denies drinking alcohol.  Patient denies any smoking history.  Patient is not taking any medication for an ulcer.    HPI  Review of Systems   Review of Systems   Constitutional:  Positive for activity change and appetite change. Negative for chills and fever.   HENT:  Negative for ear pain and sore throat.    Eyes:  Negative for pain and visual disturbance.   Respiratory:  Negative for cough and shortness of breath.    Cardiovascular:  Negative for chest pain and palpitations.   Gastrointestinal:  Positive for abdominal pain, nausea and vomiting.   Genitourinary:  Negative for dysuria and hematuria.   Musculoskeletal:  Negative for arthralgias and back pain.   Skin:  Negative for color change and rash.   Neurological:  Negative for seizures and syncope.   All other systems reviewed and are negative.     Historical Data   History Reviewed This Encounter:     Physical Exam   ED Triage Vitals [11/25/22 0901]   BP (Non-Invasive) (!) 150/73   Heart Rate (!) 102   Respiratory Rate 18   Temperature 36.2 C (97.2 F)   SpO2 97 %    Weight 80.3 kg (177 lb)   Height 1.6 m (5\' 3" )     Physical Exam  Vitals and nursing note reviewed.   Constitutional:       General: She is not in acute distress.     Appearance: She is well-developed. She is obese.   HENT:      Head: Normocephalic and atraumatic.      Right Ear: External ear normal.      Left Ear: External ear normal.      Nose: Nose normal.      Mouth/Throat:      Mouth: Mucous membranes are dry.   Eyes:      Extraocular Movements: Extraocular movements intact.      Conjunctiva/sclera: Conjunctivae normal.      Pupils: Pupils are equal, round, and reactive to light.   Cardiovascular:      Rate and Rhythm: Normal rate and regular rhythm.      Pulses: Normal pulses.      Heart sounds: Normal heart sounds. No murmur heard.  Pulmonary:      Effort: Pulmonary effort is normal. No respiratory distress.      Breath sounds: Normal breath sounds.   Abdominal:      Palpations: Abdomen is soft.      Tenderness: There is abdominal tenderness.      Comments: Positive  tenderness epigastrically.  No guarding or rebound.  Hyperactive bowel sounds throughout the abdomen.   Musculoskeletal:         General: No swelling. Normal range of motion.      Cervical back: Normal range of motion and neck supple.   Skin:     General: Skin is warm and dry.      Capillary Refill: Capillary refill takes less than 2 seconds.   Neurological:      General: No focal deficit present.      Mental Status: She is alert and oriented to person, place, and time.   Psychiatric:         Mood and Affect: Mood normal.         Thought Content: Thought content normal.       Patient Data     Labs Ordered/Reviewed   COMPREHENSIVE METABOLIC PANEL, NON-FASTING - Abnormal; Notable for the following components:       Result Value    SODIUM 146 (*)     CHLORIDE 109 (*)     BUN 24 (*)     CREATININE 1.57 (*)     ESTIMATED GFR 32 (*)     ALBUMIN 3.1 (*)     GLUCOSE 147 (*)     OSMOLALITY, CALCULATED 297 (*)     All other components within normal limits     Narrative:     Estimated Glomerular Filtration Rate (eGFR) is calculated using the CKD-EPI (2021) equation, intended for patients 46 years of age and older. If gender is not documented or "unknown", there will be no eGFR calculation.   CBC WITH DIFF - Abnormal; Notable for the following components:    RBC 4.04 (*)     HGB 11.7 (*)     HCT 36.8 (*)     MCHC 31.8 (*)     RDW 16.4 (*)     All other components within normal limits   URINALYSIS, MACRO/MICRO - Abnormal; Notable for the following components:    COLOR Light Yellow (*)     APPEARANCE Slightly Hazy (*)     BLOOD Small (*)     All other components within normal limits   URINALYSIS, MICROSCOPIC - Abnormal; Notable for the following components:    RBCS 3-5 (*)     BACTERIA Few (*)     MUCOUS Moderate (*)     SQUAMOUS EPITHELIAL Several (*)     All other components within normal limits   LIPASE - Normal   PT/INR - Normal    Narrative:     INR OF 2.0-3.0  RECOMMENDED FOR: PROPHYLAXIS/TREATMENT OF VENEOUS THROMBOSIS, PULMONARY EMBOLISM, PREVENTION OF SYSTEMIC EMBOLISM FROM ATRIAL FIBRILATION, MYOCARDIAL INFARCTION.    INR OF 2.5-3.5  RECOMMENDED FOR MECHANICAL PROSTHETIC HEART VALVES, RECURRENT SYSTEMIC EMBOLISM, RECURRENT MYOCARDIAL INFARCTION.     PTT (PARTIAL THROMBOPLASTIN TIME) - Normal   TROPONIN-I - Normal    Narrative:     Values received on females ranging between 12-15 ng/L MUST include the next serial troponin to review changes in the delta differences as the reference range for the Access II chemistry analyzer is lower than the established reference range.     TROPONIN-I - Normal    Narrative:     Values received on females ranging between 12-15 ng/L MUST include the next serial troponin to review changes in the delta differences as the reference range for the Access II chemistry analyzer is lower than the established reference range.  TROPONIN-I - Normal    Narrative:     Values received on females ranging between 12-15 ng/L MUST include the next  serial troponin to review changes in the delta differences as the reference range for the Access II chemistry analyzer is lower than the established reference range.     OCCULT BLOOD (PRN ED USE ONLY)   CBC/DIFF    Narrative:     The following orders were created for panel order CBC/DIFF.  Procedure                               Abnormality         Status                     ---------                               -----------         ------                     CBC WITH DIFF[[id number]]                Abnormal            Final result               MANUAL DIFFERENTIAL[[id number]]                              Final result                 Please view results for these tests on the individual orders.   URINALYSIS WITH REFLEX MICROSCOPIC AND CULTURE IF POSITIVE    Narrative:     The following orders were created for panel order URINALYSIS WITH REFLEX MICROSCOPIC AND CULTURE IF POSITIVE.  Procedure                               Abnormality         Status                     ---------                               -----------         ------                     URINALYSIS, MACRO/MICRO[[id number]]      Abnormal            Final result               URINALYSIS, MICROSCOPIC[[id number]]      Abnormal            Final result                 Please view results for these tests on the individual orders.   MANUAL DIFFERENTIAL     XR ABD FLAT AND UPRIGHT SERIES (W PA CHEST)   Final Result by Edi, Radresults In (04/30 3151)   Mild ileus. Mild left atelectasis/scarring            Radiologist location ID: [id number]  Medical Decision Making        Medical Decision Making  Patient is 88 year old black female complaining of epigastric pain since last night after eating dinner.  Patient has been nauseous but denies any vomiting or diarrhea.  inFact patient has been having normal bowel movements.  Patient denies any black or bloody stools.  Patient has had a peptic ulcer in the past but is no longer on any medication.  Patient does  have hepatitis-C.  She also has hypertension, diabetes, high cholesterol.  Patient has coronary artery disease as well as chronic kidney disease.  Patient will have an IV placed for hydration as well as labs and x-ray of her abdomen and pelvis.  Patient will receive Pepcid as well as Protonix along with Zofran for nausea.  Patient will be treated for results and then more than likely discharged home.  Patient will follow up with her family physician in the next 3-4 days.    Patient has been dropping down to heart rate of 47-49 but has essentially been asymptomatic.  Patient has also been dropping her pulse ox down to 76 on room air and then when you check on her she comes back up to 93-94.  She has been consistently going down in the 80s the entire time.  We sat her up in bed and it really made no difference in her pulse ox which has a very good Plath at the time.      I spoke to [facility], hospitalist at [facility] who accepted the patient for transfer and admission to their facility.    Critical Care Attestation:  As the ED physician, I have provided critical care for this patient.   This patient has high probability of imminent life or limb threatening deterioration due to acute coronary syndrome/cardiac ischemia.  I provided direct patient care, documentation of findings, review of medical records, and interpretation of EKG and frequent reassessment.  Time spent providing critical care (exclusive of any billed procedures and/or teaching):  45 minutes.     Amount and/or Complexity of Data Reviewed  Labs: ordered.  Radiology: ordered.  ECG/medicine tests: ordered.     Details: Sinus bradycardia 50, PR interval 174 MS, QT interval 416 MS, incomplete right bundle-branch block, inverted T-waves 2, 3, AVF, V3 through V6.    Risk  Prescription drug management.  Decision regarding hospitalization.             Medications Administered in the ED   pantoprazole (PROTONIX) injection (40 mg Intravenous  Given 11/25/22 0943)     And   NS flush syringe (0 mL Intravenous Not Given 11/25/22 1000)   NS bolus infusion 1,000 mL (0 mL Intravenous Stopped 11/25/22 1029)   famotidine (PEPCID) 10 mg/mL injection (20 mg Intravenous Given 11/25/22 0943)   ondansetron (ZOFRAN) 2 mg/mL injection (4 mg Intravenous Given 11/25/22 0943)   NS bolus infusion 1,000 mL (0 mL Intravenous Stopped 11/25/22 1134)     Clinical Impression   Hypoxemia (Primary)   Bradycardia   Epigastric pain   Gastroenteritis   Dehydration   Abnormal electrocardiogram (ECG) (EKG)       Disposition: Admitted               Clinical Impression   Hypoxemia (Primary)   Bradycardia   Epigastric pain   Gastroenteritis   Dehydration   Abnormal electrocardiogram (ECG) (EKG)       Current Discharge Medication List

## 2022-11-25 NOTE — ED Notes (Signed)
[facility] contacted to transport patient to main campus at this time

## 2022-11-26 ENCOUNTER — Encounter (HOSPITAL_COMMUNITY): Payer: Self-pay | Admitting: Student in an Organized Health Care Education/Training Program

## 2022-11-26 DIAGNOSIS — R9431 Abnormal electrocardiogram [ECG] [EKG]: Secondary | ICD-10-CM

## 2022-11-26 DIAGNOSIS — N1832 Chronic kidney disease, stage 3b (CMS HCC): Secondary | ICD-10-CM

## 2022-11-26 DIAGNOSIS — I2510 Atherosclerotic heart disease of native coronary artery without angina pectoris: Secondary | ICD-10-CM

## 2022-11-26 DIAGNOSIS — E1122 Type 2 diabetes mellitus with diabetic chronic kidney disease: Secondary | ICD-10-CM

## 2022-11-26 DIAGNOSIS — R1013 Epigastric pain: Secondary | ICD-10-CM

## 2022-11-26 DIAGNOSIS — I129 Hypertensive chronic kidney disease with stage 1 through stage 4 chronic kidney disease, or unspecified chronic kidney disease: Secondary | ICD-10-CM

## 2022-11-26 LAB — COMPREHENSIVE METABOLIC PANEL, NON-FASTING
ALBUMIN/GLOBULIN RATIO: 1.2 (ref 0.8–1.4)
ALBUMIN: 3.1 g/dL — ABNORMAL LOW (ref 3.5–5.7)
ALKALINE PHOSPHATASE: 66 U/L (ref 34–104)
ALT (SGPT): 12 U/L (ref 7–52)
ANION GAP: 4 mmol/L (ref 4–13)
AST (SGOT): 12 U/L — ABNORMAL LOW (ref 13–39)
BILIRUBIN TOTAL: 0.2 mg/dL — ABNORMAL LOW (ref 0.3–1.2)
BUN/CREA RATIO: 16 (ref 6–22)
BUN: 21 mg/dL (ref 7–25)
CALCIUM, CORRECTED: 8.7 mg/dL — ABNORMAL LOW (ref 8.9–10.8)
CALCIUM: 8 mg/dL — ABNORMAL LOW (ref 8.6–10.3)
CHLORIDE: 114 mmol/L — ABNORMAL HIGH (ref 98–107)
CO2 TOTAL: 25 mmol/L (ref 21–31)
CREATININE: 1.35 mg/dL — ABNORMAL HIGH (ref 0.60–1.30)
ESTIMATED GFR: 39 mL/min/{1.73_m2} — ABNORMAL LOW (ref >59)
GLOBULIN: 2.6 — ABNORMAL LOW (ref 2.9–5.4)
GLUCOSE: 167 mg/dL — ABNORMAL HIGH (ref 74–109)
OSMOLALITY, CALCULATED: 292 mOsm/kg — ABNORMAL HIGH (ref 270–290)
POTASSIUM: 4 mmol/L (ref 3.5–5.1)
PROTEIN TOTAL: 5.7 g/dL — ABNORMAL LOW (ref 6.4–8.9)
SODIUM: 143 mmol/L (ref 136–145)

## 2022-11-26 LAB — CBC WITH DIFF
BASOPHIL #: 0 10*3/uL (ref 0.00–0.10)
BASOPHIL %: 1 % (ref 0–1)
EOSINOPHIL #: 0.2 10*3/uL (ref 0.00–0.50)
EOSINOPHIL %: 3 %
HCT: 30.7 % — ABNORMAL LOW (ref 31.2–41.9)
HGB: 10.1 g/dL — ABNORMAL LOW (ref 10.9–14.3)
LYMPHOCYTE #: 1.8 10*3/uL (ref 1.00–3.00)
LYMPHOCYTE %: 37 % (ref 16–44)
MCH: 29 pg (ref 24.7–32.8)
MCHC: 32.8 g/dL (ref 32.3–35.6)
MCV: 88.5 fL (ref 75.5–95.3)
MONOCYTE #: 0.6 10*3/uL (ref 0.30–1.00)
MONOCYTE %: 13 % (ref 5–13)
MPV: 10.7 fL (ref 7.9–10.8)
NEUTROPHIL #: 2.3 10*3/uL (ref 1.85–7.80)
NEUTROPHIL %: 46 % (ref 43–77)
PLATELETS: 129 10*3/uL — ABNORMAL LOW (ref 140–440)
RBC: 3.47 10*6/uL — ABNORMAL LOW (ref 3.63–4.92)
RDW: 16.5 % (ref 12.3–17.7)
WBC: 5 10*3/uL (ref 3.8–11.8)

## 2022-11-26 LAB — ECG 12 LEAD
Atrial Rate: 54 {beats}/min
Calculated P Axis: 52 degrees
Calculated R Axis: 8 degrees
Calculated T Axis: -54 degrees
PR Interval: 174 ms
QRS Duration: 96 ms
QT Interval: 416 ms
QTC Calculation: 394 ms
Ventricular rate: 54 {beats}/min

## 2022-11-26 LAB — MAGNESIUM: MAGNESIUM: 1.6 mg/dL — ABNORMAL LOW (ref 1.9–2.7)

## 2022-11-26 LAB — LAVENDER TOP TUBE

## 2022-11-26 LAB — GOLD TOP TUBE

## 2022-11-26 NOTE — Care Plan (Signed)
Problem: Adult Inpatient Plan of Care  Goal: Plan of Care Review  Outcome: Ongoing (see interventions/notes)  Goal: Patient-Specific Goal (Individualized)  Outcome: Ongoing (see interventions/notes)  Flowsheets (Taken 11/25/2022 2000)  Patient would like to participate in bedside shift report: Yes  Individualized Care Needs: assist with ADLs as needed  Anxieties, Fears or Concerns: pain  Patient-Specific Goals (Include Timeframe): discharge when appropriate  Plan of Care Reviewed With:   patient   daughter  Goal: Absence of Hospital-Acquired Illness or Injury  Outcome: Ongoing (see interventions/notes)  Intervention: Identify and Manage Fall Risk  Recent Flowsheet Documentation  Taken 11/25/2022 2000 by Vernard Gambles, RN  Safety Promotion/Fall Prevention:   activity supervised   fall prevention program maintained   motion sensor pad activated   nonskid shoes/slippers when out of bed   safety round/check completed  Intervention: Prevent Skin Injury  Recent Flowsheet Documentation  Taken 11/25/2022 2000 by Vernard Gambles, RN  Skin Protection:   adhesive use limited   tubing/devices free from skin contact  Intervention: Prevent and Manage VTE (Venous Thromboembolism) Risk  Recent Flowsheet Documentation  Taken 11/25/2022 2000 by Vernard Gambles, RN  VTE Prevention/Management: anticoagulant therapy maintained  Intervention: Prevent Infection  Recent Flowsheet Documentation  Taken 11/25/2022 2000 by Vernard Gambles, RN  Infection Prevention:   personal protective equipment utilized   promote handwashing  Goal: Optimal Comfort and Wellbeing  Outcome: Ongoing (see interventions/notes)  Intervention: Provide Person-Centered Care  Recent Flowsheet Documentation  Taken 11/25/2022 2000 by Vernard Gambles, RN  Trust Relationship/Rapport:   choices provided   care explained   questions answered   questions encouraged   reassurance provided  Goal: Rounds/Family Conference  Outcome: Ongoing (see interventions/notes)     Problem: Fall Injury  Risk  Goal: Absence of Fall and Fall-Related Injury  Outcome: Ongoing (see interventions/notes)  Intervention: Promote Injury-Free Environment  Recent Flowsheet Documentation  Taken 11/25/2022 2000 by Vernard Gambles, RN  Safety Promotion/Fall Prevention:   activity supervised   fall prevention program maintained   motion sensor pad activated   nonskid shoes/slippers when out of bed   safety round/check completed

## 2022-11-26 NOTE — Discharge Summary (Addendum)
[facility]  DISCHARGE SUMMARY    PATIENT NAME:  Connie Barber, Connie Barber  MRN:  [id number]  DOB:  July 17, 1935    ENCOUNTER DATE:  11/25/2022  INPATIENT ADMISSION DATE: 11/25/2022  DISCHARGE DATE:  11/26/2022    ATTENDING PHYSICIAN: Venida Jarvis, DO  SERVICE: PRN HOSPITALIST 1  PRIMARY CARE PHYSICIAN: Sharlene Motts, MD       No lay caregiver identified.    PRIMARY DISCHARGE DIAGNOSIS: Bradycardia  Active Hospital Problems    Diagnosis Date Noted    Principal Problem: Bradycardia [R00.1] 11/25/2022      Resolved Hospital Problems   No resolved problems to display.     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        Details   amLODIPine 10 mg Tablet  Commonly known as: NORVASC   10 mg, Oral, DAILY  Qty: 90 Tablet  Refills: 1     aspirin 81 mg Tablet, Delayed Release (E.C.)  Commonly known as: ECOTRIN   81 mg, Oral, DAILY  Refills: 0     calcitrioL 0.25 mcg Capsule  Commonly known as: ROCALTROL   0.25 mcg, Oral, DAILY  Refills: 0     Colchicine-Probenecid 500-0.5 mg Tablet   1 Tablet, Oral, 2 TIMES DAILY WITH FOOD  Refills: 0     cyanocobalamin 1,000 mcg Tablet  Commonly known as: VITAMIN B 12   1,000 mcg, Oral, DAILY  Refills: 0     ergocalciferol (vitamin D2) 1,250 mcg (50,000 unit) Capsule  Commonly known as: DRISDOL   50,000 Units, Oral, EVERY 7 DAYS  Refills: 0     ezetimibe 10 mg Tablet  Commonly known as: ZETIA   10 mg, Oral, EVERY MORNING  Qty: 90  Tablet  Refills: 1     * glimepiride 1 mg Tablet  Commonly known as: AMARYL   1 mg, Oral, EVERY MORNING WITH BREAKFAST  Refills: 0     * glimepiride 2 mg Tablet  Commonly known as: AMARYL   4 mg, Oral, EVERY MORNING WITH BREAKFAST  Refills: 0     levothyroxine 75 mcg Tablet  Commonly known as: SYNTHROID   75 mcg, Oral, EVERY MORNING  Refills: 0     olmesartan 40 mg Tablet  Commonly known as: BENICAR   40 mg, Oral, DAILY  Qty: 90 Tablet  Refills: 3     SITagliptin phosphate 50 mg Tablet  Commonly known as: JANUVIA   50 mg, Oral, DAILY  Refills: 0           * This list has 2 medication(s) that are the same as other medications prescribed for you. Read the directions carefully, and ask your doctor or other care provider to review them with you.                STOP taking these medications.  atenoloL 25 mg Tablet  Commonly known as: TENORMIN            Discharge med list refreshed?  YES     No Known Allergies  HOSPITAL PROCEDURE(S):   No orders of the defined types were placed in this encounter.      REASON FOR HOSPITALIZATION AND HOSPITAL COURSE   [name] is an 88 year old female who presented to [facility] emergency department with complaints of epigastric pain.  KUB demonstrated mild ileus.  Her symptoms resolved while in the emergency department but she was found to be bradycardic with T-wave inversions in inferior and lateral leads on EKG.  She was admitted to telemetry and monitored with cardiac monitoring. She remained bradycardic throughout her hospital course in the low to mid 18s. She was evaluated by Cardiology who recommended holding home atenolol and no further cardiac workup.  At time of discharge she was asymptomatic and vitally stable.  She was cleared for discharge by Cardiology.  She will need outpatient sleep study and PCP follow-up.    There was question of acute heart failure although this has been ruled out.  She shows no evidence of hypervolemia on physical exam and chest x-ray shows  atelectasis but no vascular congestion.  She has no history of heart failure either.  An echocardiogram was obtained and is pending at time of discharge.    TRANSITION/POST DISCHARGE CARE/PENDING TESTS/REFERRALS:   - PCP follow up 1 week  - outpatient pulmonology referral for sleep study  - follow up with Cardiology as needed  - echocardiogram pending    CONDITION ON DISCHARGE:  A. Ambulation: Full ambulation  B. Self-care Ability: Complete  C. Cognitive Status Alert and Oriented x 3  D. Code status at discharge:       LINES/DRAINS/WOUNDS AT DISCHARGE:   Patient Lines/Drains/Airways Status       Active Line / Dialysis Catheter / Dialysis Graft / Drain / Airway / Wound       Name Placement date Placement time Site Days    Peripheral IV Left Median Cubital  (antecubital fossa) 11/25/22  0942  -- 1    Wound  Other (comment) Lower;Proximal;Right;Anterior Arm 11/25/22  1454  -- less than 1                    DISCHARGE DISPOSITION:  Home discharge and Will stay with family   DISCHARGE INSTRUCTIONS:  Post-Discharge Follow Up Appointments       Follow up with Renaye Rakers, MD    Phone: [phone]    Where: [facility]    Follow up with Sharlene Motts, MD in 1 week(s)    Phone: [phone]    Where: [facility]      Wednesday Dec 31, 2022    Lab with Lab, Nephrology [facility] [place] at  1:45 PM      Wednesday Jan 07, 2023    Return Patient Visit with Janyce Llanos, MD at  2:30 PM      Tuesday May 26, 2023    Return Patient Visit with Ward, Jeannett Senior, MD at  2:00 PM      Cardiology, Building A  Building [address], [place]  [address]  [place] [address]  [phone] Nephrology, [facility]  [facility] [place]  [address]  [place] [address]  [phone]          No discharge procedures on file.  Venida Jarvis, DO    Copies sent to Care Team         Relationship Specialty Notifications Start End    Sharlene Motts, MD PCP - General  INTERNAL MEDICINE  10/28/21     Phone: [phone] Fax: [phone]         [address] [facility] [address]            Referring providers can utilize [email or web address] to access their referred [facility] Medicine patient's information.

## 2022-11-26 NOTE — PT Evaluation (Signed)
[facility]  [address]  [place], [address]  [phone]  (Fax) [phone]  Rehabilitation Services  Physical Therapy Inpatient Initial Evaluation    Patient Name: Connie Barber  Date of Birth: [birth year]  Height: Height: 160 cm (5\' 3" )  Weight: Weight: 86.5 kg (190 lb 12.8 oz)  Room/Bed: 430/B  Payor: [employer]-MEDI-ADV / Plan: [employer] MEDICARE ADVANTAGE PPO / Product Type: PPO /       PMH:  Past Medical History:   Diagnosis Date    CKD (chronic kidney disease)     Stage 3b    Coronary artery disease     Diabetes mellitus (CMS HCC)     Dyslipidemia     Essential hypertension     Hepatitis C     Wears dentures     Wears glasses            Assessment:      (P) Patient was admitted with asymptomatic bradycardia, epigastric discomfort, acute CHF exacerbation and acute hypoxic respiratory failure. She participated well with PT evaluation. She had been improving well since admission and was discharged this PM before therapist could write this note. No further PT services due to discharge.    Discharge Needs:    Equipment Recommendation: (P) front wheeled walker       Discharge Disposition: (P) home with assist    Plan:   Current Intervention:  PT evaluation only. Discharged to home this afternoon.       Subjective & Objective     Past Medical History:   Diagnosis Date    CKD (chronic kidney disease)     Stage 3b    Coronary artery disease     Diabetes mellitus (CMS HCC)     Dyslipidemia     Essential hypertension     Hepatitis C     Wears dentures     Wears glasses             Past Surgical History:   Procedure Laterality Date    CARDIAC CATHETERIZATION  11/20/2014    HX APPENDECTOMY      HX HYSTERECTOMY          11/26/22 0915   Rehab Session   Document Type evaluation   PT Visit Date 11/26/22   General Information   Patient Profile Reviewed yes   Pertinent History of Current Functional Problem Patient to ED on 11/25/22 with epigastric discomfort and nausea. Had noted  asymptomatic bradycardia and hypoxic episodes. Admitted with asymptomatic bradycardia, epigastric discomfort, acute CHF exacerbation and acute hypoxic respiratory failure. She did well overnight and is planned to be discharged to home this afternoon if she continues to do well. Order was received for PT evaluation.   Medical Lines PIV Line;Telemetry   Respiratory Status nasal cannula   Existing Precautions/Restrictions oxygen therapy device and L/min;fall precautions   Mutuality/Individual Preferences   Anxieties, Fears or Concerns none stated   Living Environment   Lives With child(ren), adult   Living Arrangements house   Home Accessibility no concerns   Functional Level Prior   Ambulation 1 - assistive equipment   Transferring 1 - assistive equipment   Toileting 1 - assistive equipment   Bathing 3 - assistive equipment and person   Dressing 2 - assistive person   Eating 0 - independent   Pre Treatment Status   Pre Treatment Patient Status Patient supine in bed   Support Present Pre Treatment  None   Communication Pre  Treatment  Charge Nurse   Communication Pre Treatment Comment cleared for PT eval   Cognitive Assessment/Interventions   Behavior/Mood Observations alert;cooperative   Orientation Status oriented x 3   Attention WNL/WFL   Follows Commands WNL   Pre- Treatment Vital Signs   Pre-Treatment Heart Rate (beats/min) 54   Pre SpO2 (%) 92   O2 Delivery Pre Treatment supplemental O2   Vitals Comment O2 at 2 L/min via NC   Pre-Treatment Pain   Pretreatment Pain Rating 0/10 - no pain   RLE Assessment   RLE Assessment WFL for age   LLE Assessment   LLE Assessment WFL for age   Trunk Assessment   Trunk Assessment WFL-Within Functional Limits   Mobility Assessment/Training   Additional Documentation Bed Mobility Assessment/Treatment (Group);Transfer Assessment/Treatment (Group);Gait Assessment/Treatment (Group)   Bed Mobility Assessment/Treatment   Scoot/Bridge Independence minimum assist (75% patient effort)   Sit  to Supine, [place] minimum assist (75% patient effort)   Supine-Sit [place] minimum assist (75% patient effort)   Transfer Assessment/Treatment   Sit-Stand-Sit, Assist Device walker, front wheeled   Sit-Stand [place] minimum assist (75% patient effort)   Stand-Sit Independence contact guard assist   Gait Assessment/Treatment   Total Distance Ambulated 20   Assistive Device  walker, front wheeled   Comment patient was tird and did not want to go any further   Distance in Feet 20 feet   Independence  minimum assist (75% patient effort)   Post Treatment Status   Post Treatment Patient Status Patient supine in bed;Call light within reach;Patient safety alarm activated   Support Present Post Treatment  None   Patient Effort adequate   Post-Treatment Vital Signs   Post-treatment Heart Rate (beats/min) 58   Post SpO2 (%) 91   O2 Delivery Post Treatment supplemental O2   Post-Treatment Pain   Posttreatment Pain Rating 0/10 - no pain   Physical Therapy Clinical Impression   Assessment Patient was admitted with asymptomatic bradycardia, epigastric discomfort, acute CHF exacerbation and acute hypoxic respiratory failure. She participated well with PT evaluation. She had been improving well since admission and was discharged this PM before therapist could write this note. No further PT services due to discharge.   Predicted Duration of Therapy Intervention (days/wks) evaluation only   Anticipated Equipment Needs at Discharge (PT) front wheeled walker   Anticipated Discharge Disposition home with assist   Evaluation Complexity Justification   Patient History: Co-morbidity/factors that impact Plan of Care 1-2 that impact Plan of Care   Examination Components 3 or more Exam elements addressed   Presentation Stable: Uncomplicated, straight-forward, problem focused   Clinical Decision Making Low complexity   Evaluation Complexity Low complexity   Physical Therapy Time and Intention   Total PT Minutes: 16                INTERVENTION MINUTES: EVALUATION 16 minutes    EVALUATION COMPLEXITY : CLINICAL DECISION MAKING OF LOW COMPLEXITY AS INDICATED BY PMH, PHYSICAL THERAPY ASSESSMENT OF MUSCULOSKELETAL AND NEUROLOGICAL SYSTEMS AND ACTIVITY LIMITATIONS. CLINICAL PRESENTATION IS STABLE AND UNCOMPLICATED    Therapist:     Rollene Rotunda, PT  11/26/2022, 19:20

## 2022-11-26 NOTE — Care Plan (Signed)
Problem: Adult Inpatient Plan of Care  Goal: Plan of Care Review  Outcome: Ongoing (see interventions/notes)  Goal: Patient-Specific Goal (Individualized)  Outcome: Ongoing (see interventions/notes)  Goal: Absence of Hospital-Acquired Illness or Injury  Outcome: Ongoing (see interventions/notes)  Intervention: Identify and Manage Fall Risk  Recent Flowsheet Documentation  Taken 11/26/2022 0800 by Duard Larsen, RN  Safety Promotion/Fall Prevention:   activity supervised   fall prevention program maintained   motion sensor pad activated   nonskid shoes/slippers when out of bed   safety round/check completed  Intervention: Prevent Skin Injury  Recent Flowsheet Documentation  Taken 11/26/2022 0800 by Duard Larsen, RN  Skin Protection:   adhesive use limited   tubing/devices free from skin contact  Intervention: Prevent and Manage VTE (Venous Thromboembolism) Risk  Recent Flowsheet Documentation  Taken 11/26/2022 0800 by Duard Larsen, RN  VTE Prevention/Management: anticoagulant therapy maintained  Intervention: Prevent Infection  Recent Flowsheet Documentation  Taken 11/26/2022 0800 by Duard Larsen, RN  Infection Prevention:   barrier precautions utilized   environmental surveillance performed   equipment surfaces disinfected   personal protective equipment utilized   promote handwashing   rest/sleep promoted  Goal: Optimal Comfort and Wellbeing  Outcome: Ongoing (see interventions/notes)  Goal: Rounds/Family Conference  Outcome: Ongoing (see interventions/notes)     Problem: Fall Injury Risk  Goal: Absence of Fall and Fall-Related Injury  Outcome: Ongoing (see interventions/notes)  Intervention: Promote Injury-Free Environment  Recent Flowsheet Documentation  Taken 11/26/2022 0800 by Duard Larsen, RN  Safety Promotion/Fall Prevention:   activity supervised   fall prevention program maintained   motion sensor pad activated   nonskid shoes/slippers when out of bed   safety round/check completed

## 2022-11-26 NOTE — Telemedicine Consult (Signed)
[facility]  Cardiology   Consult      TELEMEDICINE CARDIOLOGY CONSULT NOTE    Telemedicine Documentation:   Modality: Video  Provider Physical Location: [place], [facility]  Patient/family aware of provider location: Yes  Patient/family consent for telemedicine: Yes  Examination observed and performed by: Amedeo Kinsman, APRN,FNP-BC, Dr. Despina Hidden  Patient Location: [facility]    Name:  Connie Barber   DOB: [birth year]   MRN: [id number]   Date:  11/25/2022     PCP: Sharlene Motts, MD   Hospital Day:  LOS: 1 day   Chief Complaint: Epigastric Pain      IMPRESSION/PLAN:  Epigastric pain, resolved  Asymptomatic bradycardia  Hypoxia    Echo:  Echo in 2022 unremarkable, no need to repeat for asymptomatic bradycardia.    Discontinue home atenolol. Sleep apnea workup as outpatient.  No further cardiac workup needed at this time.    HPI:  Connie Barber is a 88 y.o. female with a PMH of coronary artery disease, hypertension, HL, DM, PUD, hep C, and coronary artery disease who presented with epigastric pain and some nausea, no vomiting or diarrhea, actually has had normal bowel movements.  Abdominal x-ray showed mild ileus.  She does have urinary tract infection.  She had some issues with bradycardia and hypoxia in the ER.  Heart rate would dip into the 40s, she was asymptomatic.  She was taking atenolol at home.  She has not on oxygen at home.  On arrival, troponin negative x3.  TSH okay.  Creatinine 1.57, now 1.35.  Chest x-ray showed no acute changes.  EKG had some inferolateral T-wave changes, consistent with baseline.  She does have of nonobstructive coronary artery disease on heart catheterization in 2016, this was due to abnormal stress test.  EF preserved in the 50-60% range, last assessed in 2022.  Reports no further abdominal pain.  She got up and walked to restroom after exam and heart rate responded to the 60-70's.      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)  (CMS HCC) 02/18/2022    Hypomagnesemia 02/18/2022    Vitamin D deficiency 02/18/2022    Essential hypertension 02/18/2022    Chronic kidney disease, stage 4 (severe) (CMS HCC) 02/17/2022     Noted by Madelaine Bhat DO  last documented on [id number]      Type 2 diabetes mellitus without complications (CMS HCC) 02/17/2022     Noted by Benn Moulder MD  last documented on [id number]        Past Surgical History:   Procedure Laterality Date    CARDIAC CATHETERIZATION  11/20/2014    HX APPENDECTOMY      HX HYSTERECTOMY            acetaminophen (TYLENOL) tablet, 650 mg, Oral, Q4H PRN  doxycycline tablet, 100 mg, Oral, 2x/day  furosemide (LASIX) 10 mg/mL injection, 40 mg, Intravenous, 2x/day  heparin 5,000 unit/mL injection, 5,000 Units, Subcutaneous, Q8HRS  ipratropium-albuterol 0.5 mg-3 mg(2.5 mg base)/3 mL Solution for Nebulization, 3 mL, Nebulization, 4x/day  ipratropium-albuterol 0.5 mg-3 mg(2.5 mg base)/3 mL Solution for Nebulization, 3 mL, Nebulization, Q4H PRN  ipratropium-albuterol 0.5 mg-3 mg(2.5 mg base)/3 mL Solution for Nebulization, 3 mL, Nebulization, Q4H PRN  NS flush syringe, 10 mL, Intravenous, Now  ondansetron (ZOFRAN) 2 mg/mL injection, 4 mg, Intravenous, Q6H PRN         No Known Allergies  Family Medical History:       Problem Relation (Age of Onset)    Heart Disease Mother    Hypertension (High Blood Pressure) Mother    No Known Problems Sister, Brother, Maternal Grandmother, Maternal Grandfather, Paternal Grandmother, Paternal Grandfather, Daughter,  Son, Maternal Aunt, Maternal Uncle, Paternal Aunt, Paternal Uncle, Other    Stroke Father            Social History     Tobacco Use    Smoking status: Never    Smokeless tobacco: Never   Substance Use Topics    Alcohol use: Not Currently       ROS: All other ROS are negative except for what is noted in HPI     I/O last 24 hours:    Intake/Output Summary (Last 24 hours) at 11/26/2022 0629  Last data filed at 11/25/2022 1700  Gross per 24 hour   Intake 2480 ml   Output --   Net 2480 ml     I/O current shift:  No intake/output data recorded.    DIAGNOSTIC STUDIES:    Results for orders placed or performed during the hospital encounter of 11/25/22 (from the past 24 hour(s))   CBC/DIFF    Narrative    The following orders were created for panel order CBC/DIFF.  Procedure                               Abnormality         Status                     ---------                               -----------         ------                     CBC WITH DIFF[[id number]]                Abnormal            Final result               MANUAL DIFFERENTIAL[[id number]]                              Final result                 Please view results for these tests on the individual orders.   COMPREHENSIVE METABOLIC PANEL, NON-FASTING   Result Value Ref Range    SODIUM 146 (H) 136 - 145 mmol/L    POTASSIUM 4.1 3.5 - 5.1 mmol/L    CHLORIDE 109 (H) 98 - 107 mmol/L    CO2  TOTAL 30 21 - 32 mmol/L    ANION GAP 7 4 - 13 mmol/L    BUN 24 (H) 7 - 18 mg/dL    CREATININE 1.30 (H) 0.55 - 1.02 mg/dL    BUN/CREA RATIO 15     ESTIMATED GFR 32 (L) >59 mL/min/1.63m^2    ALBUMIN 3.1 (L) 3.4 - 5.0 g/dL    CALCIUM 8.6 8.5 - 86.5 mg/dL    GLUCOSE 784 (H) 74 - 106 mg/dL    ALKALINE PHOSPHATASE 93 46 - 116 U/L    ALT (SGPT) 18 <=78 U/L    AST (SGOT) 18 15 - 37 U/L    BILIRUBIN TOTAL 0.3 0.2 - 1.0 mg/dL    PROTEIN TOTAL 6.7 6.4 - 8.2 g/dL    ALBUMIN/GLOBULIN RATIO 0.9 0.8 - 1.4    OSMOLALITY, CALCULATED 297 (H) 270 - 290 mOsm/kg    CALCIUM, CORRECTED 9.3 mg/dL    GLOBULIN  3.6     Narrative    Estimated Glomerular Filtration Rate (eGFR) is calculated using the CKD-EPI (2021) equation, intended for patients 58 years of age and older. If gender is not documented or "unknown", there will be no eGFR calculation.   LIPASE   Result Value Ref Range    LIPASE 20 16 - 77 U/L   PT/INR   Result Value Ref Range    PROTHROMBIN TIME 12.4 9.8 - 12.7 seconds    INR 1.06 0.88 - 1.10    Narrative    INR OF 2.0-3.0  RECOMMENDED FOR: PROPHYLAXIS/TREATMENT OF VENEOUS THROMBOSIS, PULMONARY EMBOLISM, PREVENTION OF SYSTEMIC EMBOLISM FROM ATRIAL FIBRILATION, MYOCARDIAL INFARCTION.    INR OF 2.5-3.5  RECOMMENDED FOR MECHANICAL PROSTHETIC HEART VALVES, RECURRENT SYSTEMIC EMBOLISM, RECURRENT MYOCARDIAL INFARCTION.     PTT (PARTIAL THROMBOPLASTIN TIME)   Result Value Ref Range    APTT 27.1 22.0 - 31.7 seconds   URINALYSIS WITH REFLEX MICROSCOPIC AND CULTURE IF POSITIVE    Specimen: Urine, Site not specified    Narrative    The following orders were created for panel order URINALYSIS WITH REFLEX MICROSCOPIC AND CULTURE IF POSITIVE.  Procedure                               Abnormality         Status                     ---------                               -----------         ------                     URINALYSIS, MACRO/MICRO[[id number]]      Abnormal            Final result               URINALYSIS, MICROSCOPIC[[id number]]      Abnormal            Final result                 Please view results for these tests on the individual orders.   CBC WITH DIFF   Result Value Ref Range    WBC 5.4 4.0 - 10.5 x10^3/uL    RBC 4.04 (L) 4.20 - 5.40 x10^6/uL    HGB 11.7 (  L) 12.5 - 16.0 g/dL    HCT 16.1 (L) 09.6 - 47.0 %    MCV 91.2 78.0 - 99.0 fL    MCH 28.9 27.0 - 32.0 pg    MCHC 31.8 (L) 32.0 - 36.0 g/dL    RDW 04.5 (H) 40.9 - 14.8 %    PLATELETS 169 140 - 440 x10^3/uL    MPV 10.1 7.4 - 10.4 fL   URINALYSIS, MACRO/MICRO   Result Value Ref Range    COLOR Light Yellow (A) Yellow    APPEARANCE Slightly Hazy (A) Clear    SPECIFIC  GRAVITY 1.020 1.003 - 1.035    PH 6.0 4.6 - 8.0    LEUKOCYTES Negative Negative WBCs/uL    NITRITE Negative Negative    PROTEIN Negative Negative mg/dL    GLUCOSE Negative Negative mg/dL    KETONES Negative Negative mg/dL    BILIRUBIN Negative Negative mg/dL    BLOOD Small (A) Negative mg/dL    UROBILINOGEN 0.2 0.2 - 1.0 mg/dL   MANUAL DIFFERENTIAL   Result Value Ref Range    WBC 5.4 x10^3/uL    NEUTROPHIL % 62 40 - 76 %    LYMPHOCYTE % 30 25 - 45 %    MONOCYTE % 6 0 - 12 %    EOSINOPHIL % 2 0 - 7 %    BASOPHIL %      METAMYELOCYTE %      MYELOCYTE %      PROMYELOCYTE %      BAND %      BLAST %      OTHER %      NEUTROPHIL ABSOLUTE 3.35 1.80 - 8.40 x10^3/uL    LYMPHOCYTE ABSOLUTE 1.62 1.10 - 5.00 x10^3/uL    MONOCYTE ABSOLUTE 0.32 0.00 - 1.30 x10^3/uL    EOSINOPHIL ABSOLUTE 0.11 0.00 - 0.80 x10^3/uL    BASOPHIL ABSOLUTE      METAMYELOCYTE ABSOLUTE      MYELOCYTE ABSOLUTE      PROMYELOCYTE ABSOLUTE      BLAST ABSOLUTE      OTHER CELL ABSOLUTE      ANISOCYTOSIS      POLYCHROMASIA      POIKILOCYTOSIS      BASOPHILIC STIPPLING      MICROCYTOSIS      MACROCYTOSIS      ROULEAUX      SCHISTOCYTES      SPHEROCYTES      TARGET CELLS      TEARDROP CELLS      OVALOCYTE (ELLIPTOCYTE)      CRENATED RED CELLS      STOMATOCYTES      ACANTHOCYTES (SPUR CELL)      ECHINOCYTE (BURR CELL)      BLISTER CELLS      RBC AGGLUTINATES      HOWELL JOLLY BODIES      ATYPICAL LYMPHOCYTES      TOXIC GRANULATION      DOHLE BODIES      TOXIC VACUOLIZATION      AUER RODS      BASKET CELLS      HYPERSEGMENTATION      LARGE PLATELETS      PLATELET CLUMPS      PLATELET MORPHOLOGY COMMENT Adequate     BANDS NEUTROPHILS MANUAL      BAND ABSOLUTE      NEUTROPHILS MANUAL 62     LYMPHOCYTES MANUAL 30     MONOCYTES MANUAL 6     EOSINOPHILS MANUAL  2     BASOPHILS MANUAL      PROMYELOCYTES MANUAL      MYELOCYTES MANUAL      METAMYELOCYTES MANUAL      BLASTS MANUAL      TOTAL CELLS COUNTED [#] IN BLOOD 100     OTHER CELLS MANUAL      NUCLEATED RBC MANUAL       PLASMA CELL %      PLASMA CELL ABSOLUE      PLASMA CELLS MANUAL      HYPOCHROMASIA     TROPONIN NOW   Result Value Ref Range    TROPONIN I 11 <15 ng/L    Narrative    Values received on females ranging between 12-15 ng/L MUST include the next serial troponin to review changes in the delta differences as the reference range for the Access II chemistry analyzer is lower than the established reference range.     TROPONIN IN ONE HOUR   Result Value Ref Range    TROPONIN I 10 <15 ng/L    Narrative    Values received on females ranging between 12-15 ng/L MUST include the next serial troponin to review changes in the delta differences as the reference range for the Access II chemistry analyzer is lower than the established reference range.     TROPONIN IN THREE HOURS   Result Value Ref Range    TROPONIN I 11 <15 ng/L    Narrative    Values received on females ranging between 12-15 ng/L MUST include the next serial troponin to review changes in the delta differences as the reference range for the Access II chemistry analyzer is lower than the established reference range.     URINALYSIS, MICROSCOPIC   Result Value Ref Range    RBCS 3-5 (A) 0-3, Not Present /hpf    BACTERIA Few (A) Negative /hpf    MUCOUS Moderate (A) (none) /hpf    WBCS 0-5 Not Present, Occasional, 0-5 /hpf    SQUAMOUS EPITHELIAL Several (A) Not Present, Few /hpf   THYROID STIMULATING HORMONE WITH FREE T4 REFLEX   Result Value Ref Range    TSH 4.450 0.450 - 5.330 uIU/mL   ARTERIAL BLOOD GAS/LACTATE   Result Value Ref Range    PH (ARTERIAL) 7.30 (L) 7.35 - 7.45    PCO2 (ARTERIAL) 49 (H) 35 - 45 mm/Hg    BICARBONATE (ARTERIAL) 22.1 20.0 - 26.0 mmol/L    MET-HEMOGLOBIN 0.7 <=2.0 %    LACTATE 1.1 <=2.0 mmol/L    CARBOXYHEMOGLOBIN 1.5 <=1.5 %    O2CT 13.5 %    %FIO2 (ARTERIAL) 21 %    PO2 (ARTERIAL) 53 (L) 80 - 100 mm/Hg    OXYHEMOGLOBIN 84.5 (LL) 88.0 - 100.0 %    ALLEN TEST yes     DRAW SITE RB     BASE DEFICIT 2.2 (H) 0.0 - 2.0 mmol/L   EXTRA TUBES     Narrative    The following orders were created for panel order EXTRA TUBES.  Procedure                               Abnormality         Status                     ---------                               -----------         ------  BLUE TOP MWUX[[id number]]                                    Final result               GOLD TOP OZDG[[id number]]                                    Final result               LAVENDER TOP VZDG[[id number]]                                Final result                 Please view results for these tests on the individual orders.   BLUE TOP TUBE   Result Value Ref Range    RAINBOW/EXTRA TUBE AUTO RESULT Yes    GOLD TOP TUBE   Result Value Ref Range    RAINBOW/EXTRA TUBE AUTO RESULT Yes    LAVENDER TOP TUBE   Result Value Ref Range    RAINBOW/EXTRA TUBE AUTO RESULT Yes    CBC/DIFF    Narrative    The following orders were created for panel order CBC/DIFF.  Procedure                               Abnormality         Status                     ---------                               -----------         ------                     CBC WITH RJJO[[id number]]                Abnormal            Final result                 Please view results for these tests on the individual orders.   COMPREHENSIVE METABOLIC PANEL, NON-FASTING   Result Value Ref Range    SODIUM 143 136 - 145 mmol/L    POTASSIUM 4.0 3.5 - 5.1 mmol/L    CHLORIDE 114 (H) 98 - 107 mmol/L    CO2 TOTAL 25 21 - 31 mmol/L    ANION GAP 4 4 - 13 mmol/L    BUN 21 7 - 25 mg/dL    CREATININE 1.60 (H) 0.60 - 1.30 mg/dL    BUN/CREA RATIO 16 6 - 22    ESTIMATED GFR 39 (L) >59 mL/min/1.27m^2    ALBUMIN 3.1 (L) 3.5 - 5.7 g/dL    CALCIUM 8.0 (L) 8.6 - 10.3 mg/dL    GLUCOSE 109 (H) 74 - 109 mg/dL    ALKALINE PHOSPHATASE 66 34 - 104 U/L    ALT (SGPT) 12 7 - 52 U/L    AST (SGOT) 12 (L) 13 - 39 U/L    BILIRUBIN TOTAL 0.2 (L) 0.3 - 1.2 mg/dL  PROTEIN TOTAL 5.7 (L) 6.4 - 8.9 g/dL    ALBUMIN/GLOBULIN RATIO 1.2 0.8 - 1.4    OSMOLALITY,  CALCULATED 292 (H) 270 - 290 mOsm/kg    CALCIUM, CORRECTED 8.7 (L) 8.9 - 10.8 mg/dL    GLOBULIN 2.6 (L) 2.9 - 5.4    Narrative    Estimated Glomerular Filtration Rate (eGFR) is calculated using the CKD-EPI (2021) equation, intended for patients 80 years of age and older. If gender is not documented or "unknown", there will be no eGFR calculation.     MAGNESIUM   Result Value Ref Range    MAGNESIUM 1.6 (L) 1.9 - 2.7 mg/dL   CBC WITH DIFF   Result Value Ref Range    WBC 5.0 3.8 - 11.8 x10^3/uL    RBC 3.47 (L) 3.63 - 4.92 x10^6/uL    HGB 10.1 (L) 10.9 - 14.3 g/dL    HCT 86.5 (L) 78.4 - 41.9 %    MCV 88.5 75.5 - 95.3 fL    MCH 29.0 24.7 - 32.8 pg    MCHC 32.8 32.3 - 35.6 g/dL    RDW 69.6 29.5 - 28.4 %    PLATELETS 129 (L) 140 - 440 x10^3/uL    MPV 10.7 7.9 - 10.8 fL    NEUTROPHIL % 46 43 - 77 %    LYMPHOCYTE % 37 16 - 44 %    MONOCYTE % 13 5 - 13 %    EOSINOPHIL % 3 %    BASOPHIL % 1 0 - 1 %    NEUTROPHIL # 2.30 1.85 - 7.80 x10^3/uL    LYMPHOCYTE # 1.80 1.00 - 3.00 x10^3/uL    MONOCYTE # 0.60 0.30 - 1.00 x10^3/uL    EOSINOPHIL # 0.20 0.00 - 0.50 x10^3/uL    BASOPHIL # 0.00 0.00 - 0.10 x10^3/uL        Cardiopulmonary/Radiology:  XR AP MOBILE CHEST    Result Date: 11/25/2022  Impression NO CHANGE FROM THE PREVIOUS STUDY. Radiologist location ID: [id number]     XR ABD FLAT AND UPRIGHT SERIES (W PA CHEST)    Result Date: 11/25/2022  Impression Mild ileus. Mild left atelectasis/scarring Radiologist location ID: [id number]      No results found for this or any previous visit (from the past 2400 hour(s)).     PHYSICAL EXAMINATION:  BP 130/66   Pulse (!) 47   Temp 36.5 C (97.7 F)   Resp 18   Ht 1.6 m (5\' 3" )   Wt 86.5 kg (190 lb 12.8 oz)   SpO2 96%   BMI 33.80 kg/m     General: No acute distress and appears stated age.    HEENT: Head normocephalic, atraumatic.  Mucouse membranes moist.    Neck: No JVD, no carotid bruit.    Lungs: Clear to auscultation bilaterally.    Cardiovascular: Slow rate and rhythm, normal  S1-S2 without murmur, no gallop or rub.    Abdomen: Soft, bowel sounds normal.    Extremities: No edema.  Skin: Skin warm and dry.    Neurologic: Alert and oriented x3.  Psych: Mood and affect congruent for age and gender     I participated and evaluated the patient as part of a collaborative telemedicine service.  See Dr. Lovina Reach addendum for additional information.  My findings from this visit are as stated above.  The co-signing physician did not participate in the management of this patient unless otherwise noted.      Amedeo Kinsman, APRN,FNP-BC 11/26/2022 06:29  I personally saw and evaluated the patient. See mid-level's note for additional details. My findings/participation are asymptomatic bradycardia, hold atenolol.     John Giovanni, DO

## 2022-11-26 NOTE — Nurses Notes (Signed)
Patient discharged home with family. IV removed and intact, Tele removed.  AVS reviewed with patient/care giver.  A written copy of the AVS and discharge instructions was given to the patient/care giver.  Questions sufficiently answered as needed.  Patient/care giver encouraged to follow up with PCP as indicated and to keep all scheduled appointments.  In the event of an emergency, patient/care giver instructed to call 911 or go to the nearest emergency room.  All personal belongings sent with patient/care giver.

## 2022-12-03 ENCOUNTER — Telehealth (HOSPITAL_COMMUNITY): Payer: Self-pay

## 2022-12-03 NOTE — Telephone Encounter (Signed)
TCM visit completed 5/7 with Dr. Francisco Capuchin.

## 2022-12-17 ENCOUNTER — Ambulatory Visit (INDEPENDENT_AMBULATORY_CARE_PROVIDER_SITE_OTHER): Payer: Medicare PPO | Admitting: NURSE PRACTITIONER

## 2022-12-17 ENCOUNTER — Encounter (INDEPENDENT_AMBULATORY_CARE_PROVIDER_SITE_OTHER): Payer: Self-pay | Admitting: NURSE PRACTITIONER

## 2022-12-17 ENCOUNTER — Other Ambulatory Visit: Payer: Self-pay

## 2022-12-17 VITALS — BP 154/79 | HR 90 | Ht 63.0 in | Wt 177.0 lb

## 2022-12-17 DIAGNOSIS — I509 Heart failure, unspecified: Secondary | ICD-10-CM

## 2022-12-17 DIAGNOSIS — R600 Localized edema: Secondary | ICD-10-CM

## 2022-12-17 DIAGNOSIS — I5030 Unspecified diastolic (congestive) heart failure: Secondary | ICD-10-CM

## 2022-12-17 DIAGNOSIS — I2510 Atherosclerotic heart disease of native coronary artery without angina pectoris: Secondary | ICD-10-CM

## 2022-12-17 DIAGNOSIS — I10 Essential (primary) hypertension: Secondary | ICD-10-CM

## 2022-12-17 DIAGNOSIS — R9431 Abnormal electrocardiogram [ECG] [EKG]: Secondary | ICD-10-CM

## 2022-12-17 NOTE — Progress Notes (Signed)
Cardiology Clinic [facility] Cardiology    Name: Connie Barber  Age: 88 y.o.  Date of Service: 12/17/2022    Primary Care Provider: Sharlene Motts, MD  Chief Complaint:   Chief Complaint   Patient presents with    Follow Up     CAD  HFpreservedEF    Hypertension       Subjective:  The patient has a history of CAD. In Aleph Nickson 2016 she underwent cardiac catheterization due to abnormal stress test which revealed a small to moderate apical reversible perfusion defect. Cardiac catheterization was then performed and this revealed EF 50% with maybe some slight basal inferior akinesis and some questionable anterior hypokinesis. The right coronary artery was a small nondominant vessel. LAD was a large vessel wrapped around the apex and had 50% mid stenosis. Left circumflex was a dominant vessel appeared to be angiographically normal. The patient has echocardiogram in October 2015, which was unremarkable with normal LV function. No significant valvular disease. Holter recording has been okay. Lipids have been well controlled. Despite this, was started on low dose statin due to her moderate CAD. Repeat echocardiogram in December 2016 revealed EF of 55 to 60%. Normal study.  Repeat echocardiogram on 09-17-20 which showed normal LV function, EF 60%.  There is moderate diastolic dysfunction.  Mild LVH and mild left atrial enlargement.     05-10-21 The patient is here for routine follow-up for CAD.  She denies any chest pains.  She does have some shortness of breath with exertion, but this is unchanged.  She is tolerating medications well.  Blood pressure was elevated today.  She states her blood pressure cuff at home broke so she has not had 1, but she decided that when sent to her.  She states previously her blood pressure usually stays in the 130s systolic.  She did have labs last month which looked really good.  Her A1c had improved to 6.5, total cholesterol 135, triglycerides 52, HDL 44, LDL 81, BUN 27, creatinine 1.5,  sodium 143, potassium 4.2.    11/19/2021: The patient is here for routine follow up for CAD.  She denies any chest pain or palpitations.  She does report she has shortness of breath chronically if common stairs otherwise denies any PND or orthopnea.  She does have bilateral lower extremity edema that she says comes and goes in regards to worsening.  She is diabeti,  last known A1c was 6.5.  The blood pressure is elevated today however home readings have been 130s to 140s most of the time.  She had recent labs with her primary care however I do not have a copy of those reports at this time.    05/20/22 The patient is here for CAD f/u.  She denies any chest pains.  She does get shortness of breath going up stairs, but this is unchanged for her.  She is tolerating medications well blood pressure was elevated today, she was checking.  She states she is supposed to be getting a new blood pressure cuff.  Her swelling in her legs has been stable.  She does notice if she eats more salt or keeps her legs down in his worse.  She has not been taking fluid medicine though.  If she props her legs up for swelling usually improves.    11/18/2022: The patient is here for CAD follow up.  She denies any chest pain or palpitations.  She does get short of breath when she walks up stairs this  is chronic and unchanged.  Tolerating medications without difficulty.  Blood pressure is elevated arrival however prior to leaving home it was 138/70 she does check regularly.  She does have chronic lower extremity edema that worsened throughout the day and resolve overnight or elevation.  She does continue to follow with Nephrology for chronic kidney disease.  No new concerns voiced.    12/17/2022: The patient is here for CAD follow up. She was here 1 month ago and was doing well. She denies any She denies any chest pain or palpitations.  She does get short of breath when she walks up stairs this is chronic and unchanged.  Tolerating medications  without difficulty.  Blood pressure is elevated arrival however prior to leaving home it was 13-140/ 70's she does check regularly.  She does have chronic LE swelling that resolves overnight. No new concerns voiced.     Past Medical History:  Past Medical History:   Diagnosis Date    CKD (chronic kidney disease)     Stage 3b    Coronary artery disease     Diabetes mellitus (CMS HCC)     Dyslipidemia     Essential hypertension     Hepatitis C     Wears dentures     Wears glasses          Social History:  Social History     Tobacco Use   Smoking Status Never   Smokeless Tobacco Never      Social History     Substance and Sexual Activity   Alcohol Use Not Currently      Social History     Substance and Sexual Activity   Drug Use Never      Current Medications:  Current Outpatient Medications   Medication Sig    amLODIPine (NORVASC) 10 mg Oral Tablet Take 1 Tablet (10 mg total) by mouth Once a day for 90 days    aspirin (ECOTRIN) 81 mg Oral Tablet, Delayed Release (E.C.) Take 1 Tablet (81 mg total) by mouth Once a day    calcitrioL (ROCALTROL) 0.25 mcg Oral Capsule Take 1 Capsule (0.25 mcg total) by mouth Once a day    Colchicine-Probenecid 500-0.5 mg Oral Tablet Take 1 Tablet by mouth Every evening    cyanocobalamin (VITAMIN B 12) 1,000 mcg Oral Tablet Take 1 Tablet (1,000 mcg total) by mouth Once a day    ergocalciferol, vitamin D2, (DRISDOL) 1,250 mcg (50,000 unit) Oral Capsule Take 1 Capsule (50,000 Units total) by mouth Every 7 days    esomeprazole magnesium (NEXIUM) 40 mg Oral Capsule, Delayed Release(E.C.) Take 1 Capsule (40 mg total) by mouth Once per day as needed    ezetimibe (ZETIA) 10 mg Oral Tablet Take 1 Tablet (10 mg total) by mouth Every morning for 180 days    glimepiride (AMARYL) 1 mg Oral Tablet Take 1 Tablet (1 mg total) by mouth Every morning with breakfast    glimepiride (AMARYL) 2 mg Oral Tablet Take 1 Tablet (2 mg total) by mouth Every morning with breakfast    hydroCHLOROthiazide (HYDRODIURIL)  12.5 mg Oral Tablet Take 1 Tablet (12.5 mg total) by mouth Once a day    levothyroxine (SYNTHROID) 75 mcg Oral Tablet Take 1 Tablet (75 mcg total) by mouth Every morning    olmesartan (BENICAR) 40 mg Oral Tablet Take 1 Tablet (40 mg total) by mouth Once a day for 360 days Indications: chronic kidney disease with albuminuria    pravastatin (PRAVACHOL) 10 mg Oral  Tablet Take 1 Tablet (10 mg total) by mouth Every morning    SITagliptin phosphate (JANUVIA) 50 mg Oral Tablet Take 1 Tablet (50 mg total) by mouth Once a day     Allergies:  No Known Allergies   Review of Systems:  Complete ROS was performed and otherwise negative unless noted in HPI.    Vital Signs:  Vitals:    12/17/22 1057   BP: (!) 154/79   Pulse: 90   SpO2: 95%   Weight: 80.3 kg (177 lb)   Height: 1.6 m (5\' 3" )   BMI: 31.42      Physical Exam:  General: Pt resting comfortably in no acute distress and appears stated age.    Neck: No JVD, no carotid bruit. Neck supple, symmetrical, trachea midline.   Lungs:  Normal respiratory effort, lungs clear to auscultation bilaterally.    Cardiovascular:  Regular rate and rhythm.  Normal S1 and S2 without murmur, gallop, or rub.  Abdomen: Soft, non-tender and bowel sounds normal.    Extremities: Extremities 2+ bilateral lower extremity edema  Neurologic: Alert and oriented x3.     Assessment:    Coronary atherosclerosis of native coronary artery    Heart failure with preserved ejection fraction, unspecified HF chronicity (CMS HCC)    Essential hypertension    CAD in native artery      Plan:   Patient is stable from cardiac standpoint.   She is also only taking Zetia for statin, she was unable to tolerate other statin drugs. Her dyspnea is stable.   I recommend she keep a log of her blood pressure and take this into her PCP at next visit to see if further adjustments to be made. Advise trial of LE compression stockings, place on in the morning and remove at bedtime.  Return in 6 months for routine  follow-up.    Orders placed this visit:  Orders Placed This Encounter    EKG (In-Clinic Today)       [place] is to return to clinic for follow up with the understanding that should symptoms change or worsen she is to call the office or go to the closest emergency department for evaluation.    Kerstie Agent A. Domonick Sittner, APRN,FNP-BC    A portion of this documentation may have been generated using MMODAL voice recognition software and may contain syntax/voice recognition errors.

## 2022-12-25 LAB — ECG W INTERP (AMB USE ONLY)(MUSE,IN CLINIC)
Atrial Rate: 56 {beats}/min
Calculated P Axis: 60 degrees
Calculated R Axis: -26 degrees
Calculated T Axis: -66 degrees
PR Interval: 172 ms
QRS Duration: 98 ms
QT Interval: 426 ms
QTC Calculation: 411 ms
Ventricular rate: 56 {beats}/min

## 2022-12-31 ENCOUNTER — Other Ambulatory Visit: Payer: Medicare PPO

## 2022-12-31 ENCOUNTER — Other Ambulatory Visit: Payer: Self-pay

## 2022-12-31 ENCOUNTER — Ambulatory Visit (INDEPENDENT_AMBULATORY_CARE_PROVIDER_SITE_OTHER): Payer: Medicare PPO

## 2022-12-31 DIAGNOSIS — N1832 Chronic kidney disease, stage 3b (CMS HCC): Secondary | ICD-10-CM

## 2022-12-31 DIAGNOSIS — D631 Anemia in chronic kidney disease: Secondary | ICD-10-CM

## 2022-12-31 DIAGNOSIS — N183 Chronic kidney disease, stage 3 unspecified (CMS HCC): Secondary | ICD-10-CM

## 2022-12-31 DIAGNOSIS — E559 Vitamin D deficiency, unspecified: Secondary | ICD-10-CM | POA: Insufficient documentation

## 2022-12-31 DIAGNOSIS — I10 Essential (primary) hypertension: Secondary | ICD-10-CM | POA: Insufficient documentation

## 2022-12-31 LAB — COMPREHENSIVE METABOLIC PANEL, NON-FASTING
ALBUMIN/GLOBULIN RATIO: 1.2 (ref 0.8–1.4)
ALBUMIN: 4 g/dL (ref 3.5–5.7)
ALKALINE PHOSPHATASE: 76 U/L (ref 34–104)
ALT (SGPT): 21 U/L (ref 7–52)
ANION GAP: 7 mmol/L (ref 4–13)
AST (SGOT): 24 U/L (ref 13–39)
BILIRUBIN TOTAL: 0.4 mg/dL (ref 0.3–1.2)
BUN/CREA RATIO: 16 (ref 6–22)
BUN: 26 mg/dL — ABNORMAL HIGH (ref 7–25)
CALCIUM, CORRECTED: 9.4 mg/dL (ref 8.9–10.8)
CALCIUM: 9.4 mg/dL (ref 8.6–10.3)
CHLORIDE: 108 mmol/L — ABNORMAL HIGH (ref 98–107)
CO2 TOTAL: 28 mmol/L (ref 21–31)
CREATININE: 1.59 mg/dL — ABNORMAL HIGH (ref 0.60–1.30)
ESTIMATED GFR: 32 mL/min/{1.73_m2} — ABNORMAL LOW (ref >59)
GLOBULIN: 3.4 (ref 2.9–5.4)
GLUCOSE: 109 mg/dL (ref 74–109)
OSMOLALITY, CALCULATED: 290 mOsm/kg (ref 270–290)
POTASSIUM: 4.3 mmol/L (ref 3.5–5.1)
PROTEIN TOTAL: 7.4 g/dL (ref 6.4–8.9)
SODIUM: 143 mmol/L (ref 136–145)

## 2022-12-31 LAB — MAGNESIUM: MAGNESIUM: 1.8 mg/dL — ABNORMAL LOW (ref 1.9–2.7)

## 2022-12-31 LAB — CBC
HCT: 36.9 % (ref 31.2–41.9)
HGB: 11.9 g/dL (ref 10.9–14.3)
MCH: 28.6 pg (ref 24.7–32.8)
MCHC: 32.3 g/dL (ref 32.3–35.6)
MCV: 88.7 fL (ref 75.5–95.3)
MPV: 10.6 fL (ref 7.9–10.8)
PLATELETS: 203 10*3/uL (ref 140–440)
RBC: 4.16 10*6/uL (ref 3.63–4.92)
RDW: 15.6 % (ref 12.3–17.7)
WBC: 6.9 10*3/uL (ref 3.8–11.8)

## 2022-12-31 LAB — FOLATE: FOLATE: 15.9 ng/mL (ref 5.9–24.4)

## 2022-12-31 LAB — URIC ACID: URIC ACID: 6.6 mg/dL (ref 2.3–7.6)

## 2022-12-31 LAB — VITAMIN B12: VITAMIN B 12: 504 pg/mL (ref 180–914)

## 2022-12-31 LAB — IRON TRANSFERRIN AND TIBC
IRON (TRANSFERRIN) SATURATION: 20 % (ref 15–50)
IRON: 64 ug/dL (ref 50–212)
TOTAL IRON BINDING CAPACITY: 314 ug/dL (ref 250–450)
TRANSFERRIN: 224 mg/dL (ref 203–362)
UIBC: 250 ug/dL (ref 130–375)

## 2022-12-31 LAB — PROTEIN/CREATININE RATIO, URINE, RANDOM
CREATININE RANDOM URINE: 111 mg/dL (ref 30–125)
PROTEIN RANDOM URINE: 23 mg/dL — ABNORMAL LOW (ref 50–80)
PROTEIN/CREATININE RATIO RANDOM URINE: 0.207 mg/mg (ref 0.000–200.000)

## 2022-12-31 LAB — VITAMIN D 25 TOTAL: VITAMIN D: 46 ng/mL (ref 30–100)

## 2022-12-31 LAB — PARATHYROID HORMONE (PTH): PTH: 63.5 pg/mL (ref 12.0–88.0)

## 2022-12-31 LAB — PHOSPHORUS: PHOSPHORUS: 3.8 mg/dL (ref 3.7–7.2)

## 2023-01-01 LAB — HGA1C (HEMOGLOBIN A1C WITH EST AVG GLUCOSE): HEMOGLOBIN A1C: 6 % (ref 4.0–6.0)

## 2023-01-07 ENCOUNTER — Ambulatory Visit (INDEPENDENT_AMBULATORY_CARE_PROVIDER_SITE_OTHER): Payer: Medicare PPO

## 2023-01-07 ENCOUNTER — Encounter (INDEPENDENT_AMBULATORY_CARE_PROVIDER_SITE_OTHER): Payer: Self-pay

## 2023-01-07 ENCOUNTER — Other Ambulatory Visit: Payer: Self-pay

## 2023-01-07 VITALS — BP 155/72 | HR 72 | Ht 63.0 in | Wt 177.0 lb

## 2023-01-07 DIAGNOSIS — I129 Hypertensive chronic kidney disease with stage 1 through stage 4 chronic kidney disease, or unspecified chronic kidney disease: Secondary | ICD-10-CM

## 2023-01-07 DIAGNOSIS — Z7984 Long term (current) use of oral hypoglycemic drugs: Secondary | ICD-10-CM

## 2023-01-07 DIAGNOSIS — E559 Vitamin D deficiency, unspecified: Secondary | ICD-10-CM

## 2023-01-07 DIAGNOSIS — N1832 Chronic kidney disease, stage 3b (CMS HCC): Secondary | ICD-10-CM

## 2023-01-07 DIAGNOSIS — N183 Chronic kidney disease, stage 3 unspecified (CMS HCC): Secondary | ICD-10-CM

## 2023-01-07 DIAGNOSIS — E1122 Type 2 diabetes mellitus with diabetic chronic kidney disease: Secondary | ICD-10-CM

## 2023-01-07 DIAGNOSIS — E119 Type 2 diabetes mellitus without complications: Secondary | ICD-10-CM

## 2023-01-07 DIAGNOSIS — I10 Essential (primary) hypertension: Secondary | ICD-10-CM

## 2023-01-12 NOTE — Progress Notes (Signed)
NEPHROLOGY, [facility]  [address] [place] [address]    Progress Note    Name: Connie Barber MRN:  [id number]   Date: 01/07/2023 DOB:  [birth year] (88 y.o.)              Nephrology Out  Patient Visit       HPI : 88 y.o. female presents to the office for chronic kidney disease stage IV creatinine 1.59 GFR 32.  Past medical history hypertension,  non-insulin-dependent diabetes mellitus type 2 and dyslipidemia.  Denies nausea and vomiting.  States blood pressure has been under control.  Denies current NSAID use.  No edema noted today denies problems with edema.  Last hemoglobin A1c was 6.         ROS: Assessment  was negative except what mentioned in in the HPI.     OBJECTIVE:   BP (!) 155/72   Pulse 72   Ht 1.6 m (5\' 3" )   Wt 80.3 kg (177 lb)   BMI 31.35 kg/m       General:  NAD, AAOx3  HEENT:  EOMI,  no icterus  NECK: No increased JVD.  Normal inspection   HEART:  Regular rhythm. No murmurs or rubs. No chest wall tenderness   LUNGS: Clear to auscultation bilateral. No wheezes, rales, or rhonchi.   ABDOMEN: +BS, Soft, nontender and nondistended. No rebound or guarding present.   EXTREMITIES: No edema. No cyanosis or clubbing.No asterixis    NEURO : Normal speech, EOMI.       LABORATORY DATA:   Lab Results   Component Value Date    BUN 26 (H) 12/31/2022    BUN 21 11/26/2022    BUN 24 (H) 11/25/2022    CREATININE 1.59 (H) 12/31/2022    CREATININE 1.35 (H) 11/26/2022    CREATININE 1.57 (H) 11/25/2022    BUNCRRATIO 16 12/31/2022    BUNCRRATIO 16 11/26/2022    BUNCRRATIO 15 11/25/2022    GFR 32 (L) 12/31/2022    GFR 39 (L) 11/26/2022    GFR 32 (L) 11/25/2022    SODIUM 143 12/31/2022    SODIUM 143 11/26/2022    SODIUM 146 (H) 11/25/2022    POTASSIUM 4.3 12/31/2022    POTASSIUM 4.0 11/26/2022    POTASSIUM 4.1 11/25/2022    CHLORIDE 108 (H) 12/31/2022    CHLORIDE 114 (H) 11/26/2022    CHLORIDE 109 (H) 11/25/2022    CO2 28 12/31/2022    CO2 25 11/26/2022    CO2 30 11/25/2022    ANIONGAP 7  12/31/2022    ANIONGAP 4 11/26/2022    ANIONGAP 7 11/25/2022    CALCIUM 9.4 12/31/2022    CALCIUM 8.0 (L) 11/26/2022    CALCIUM 8.6 11/25/2022    PHOSPHORUS 3.8 12/31/2022    PHOSPHORUS 3.6 (L) 07/15/2022    PHOSPHORUS 4.4 02/10/2022    ALBUMIN 4.0 12/31/2022    ALBUMIN 3.1 (L) 11/26/2022    ALBUMIN 3.1 (L) 11/25/2022    HGB 11.9 12/31/2022    HGB 10.1 (L) 11/26/2022    HGB 11.7 (L) 11/25/2022    HCT 36.9 12/31/2022    HCT 30.7 (L) 11/26/2022    HCT 36.8 (L) 11/25/2022    INTACTPTH 63.5 12/31/2022    INTACTPTH 101.7 (H) 07/15/2022    INTACTPTH 61.8 02/10/2022    IRON 64 12/31/2022    IRON 84 07/15/2022    IRON 81 02/10/2022    IRONBINDCAP 314 12/31/2022    IRONBINDCAP 283 07/15/2022  IRONBINDCAP 336 02/10/2022    IRONSAT 20 12/31/2022    IRONSAT 30 07/15/2022    IRONSAT 24 02/10/2022    FERRITIN 69 02/10/2022           MEDICATIONS:  Outpatient Medications Marked as Taking for the 01/07/23 encounter (Office Visit) with Cydney Ok, NP   Medication Sig    aspirin (ECOTRIN) 81 mg Oral Tablet, Delayed Release (E.C.) Take 1 Tablet (81 mg total) by mouth Once a day    calcitrioL (ROCALTROL) 0.25 mcg Oral Capsule Take 1 Capsule (0.25 mcg total) by mouth Once a day    Colchicine-Probenecid 500-0.5 mg Oral Tablet Take 1 Tablet by mouth Every evening    cyanocobalamin (VITAMIN B 12) 1,000 mcg Oral Tablet Take 1 Tablet (1,000 mcg total) by mouth Once a day    ergocalciferol, vitamin D2, (DRISDOL) 1,250 mcg (50,000 unit) Oral Capsule Take 1 Capsule (50,000 Units total) by mouth Every 7 days    esomeprazole magnesium (NEXIUM) 40 mg Oral Capsule, Delayed Release(E.C.) Take 1 Capsule (40 mg total) by mouth Once per day as needed    glimepiride (AMARYL) 1 mg Oral Tablet Take 1 Tablet (1 mg total) by mouth Every morning with breakfast    glimepiride (AMARYL) 2 mg Oral Tablet Take 1 Tablet (2 mg total) by mouth Every morning with breakfast    hydroCHLOROthiazide (HYDRODIURIL) 12.5 mg Oral Tablet Take 1 Tablet (12.5 mg total)  by mouth Once a day    levothyroxine (SYNTHROID) 75 mcg Oral Tablet Take 1 Tablet (75 mcg total) by mouth Every morning    olmesartan (BENICAR) 40 mg Oral Tablet Take 1 Tablet (40 mg total) by mouth Once a day for 360 days Indications: chronic kidney disease with albuminuria    pravastatin (PRAVACHOL) 10 mg Oral Tablet Take 1 Tablet (10 mg total) by mouth Every morning    SITagliptin phosphate (JANUVIA) 50 mg Oral Tablet Take 1 Tablet (50 mg total) by mouth Once a day     Current Outpatient Medications   Medication Instructions    amLODIPine (NORVASC) 10 mg, Oral, DAILY    aspirin (ECOTRIN) 81 mg, Oral, DAILY    calcitrioL (ROCALTROL) 0.25 mcg, Oral, DAILY    Colchicine-Probenecid 500-0.5 mg Oral Tablet 1 Tablet, Oral, EVERY EVENING    cyanocobalamin (VITAMIN B 12) 1,000 mcg, Oral, DAILY    ergocalciferol (vitamin D2) (DRISDOL) 50,000 Units, Oral, EVERY 7 DAYS    esomeprazole magnesium (NEXIUM) 40 mg, Oral, DAILY PRN    ezetimibe (ZETIA) 10 mg, Oral, EVERY MORNING    glimepiride (AMARYL) 1 mg, Oral, EVERY MORNING WITH BREAKFAST    glimepiride (AMARYL) 2 mg, Oral, EVERY MORNING WITH BREAKFAST    hydroCHLOROthiazide (HYDRODIURIL) 12.5 mg, Oral, DAILY    levothyroxine (SYNTHROID) 75 mcg, Oral, EVERY MORNING    olmesartan (BENICAR) 40 mg, Oral, DAILY    pravastatin (PRAVACHOL) 10 mg, Oral, EVERY MORNING    SITagliptin phosphate (JANUVIA) 50 mg, Oral, DAILY       ASSESSMENT / PLAN:   ENCOUNTER DIAGNOSES     ICD-10-CM   1. Chronic kidney disease, stage 3 (CMS HCC)  N18.30   2. Hypertension, unspecified type  I10   3. DM (diabetes mellitus), type 2 (CMS HCC)  E11.9   4. Vitamin D deficiency  E55.9   5. Hypomagnesemia  E83.42            Chronic kidney disease  -Stage IIIb  -Due to diabetes and hypertension  -Creatinine is stable  1.59/GFR 32  -  Baseline creatinine: 1.57  -Total protein to creatinine ratio: 0.21  -CBC and a basic metabolic panel  -Return to clinic in 3 months  -Continue low-sodium diet  -balanced Fluid intake  40-50 oz a day.    -Avoid NSAIDs.  Tylenol only for pain  -Renal imaging:  No hydronephrosis no cysts or mass  -ACEI or ARBS:  yes  -olmesartan 40 mg daily  -continue amlodipine 10 mg daily  -Sodium Glucose Cotransporter-2 (SGLT2) Inhibitors: Y  N contraindicated            Hypertension  -Blood pressure is acceptable  -Goal less than 130/80  -Continue losartan 100 mg daily  -Low-salt diet          Diabetes mellitus type 2  -Continue Januvia 50 mg daily  -Diabetic diet  -Increase activity and exercise as possible  -Monitor A1C        Vitamin-D deficiency  Monitor vitamin-D levels        Hypomagnesemia  -monitor magnesium level  -continue magnesium supplement        I have discussed with the patient the following issues:  The main goal of treatment is to prevent progression of CKD to complete kidney failure. The best way to do this is to control the underlying cause.  the optimal diet is one similar to the Dietary Approaches to Stop Hypertension (DASH) diet, consisting of fruits, vegetables, legumes, fish, poultry, and whole grains.  Restrict sodium intake to less than 2 g/day          Orders Placed This Encounter    BASIC METABOLIC PANEL    CBC/DIFF    MAGNESIUM    MICROALBUMIN/CREATININE RATIO, URINE, RANDOM    PARATHYROID HORMONE (PTH)    PROTEIN/CREATININE RATIO, URINE, RANDOM    URIC ACID    VITAMIN D 25 TOTAL       Cydney Ok, NP

## 2023-03-16 ENCOUNTER — Other Ambulatory Visit: Payer: Medicare PPO

## 2023-03-16 ENCOUNTER — Other Ambulatory Visit: Payer: Self-pay

## 2023-03-16 DIAGNOSIS — E785 Hyperlipidemia, unspecified: Secondary | ICD-10-CM | POA: Insufficient documentation

## 2023-03-16 DIAGNOSIS — E119 Type 2 diabetes mellitus without complications: Secondary | ICD-10-CM | POA: Insufficient documentation

## 2023-03-16 DIAGNOSIS — I10 Essential (primary) hypertension: Secondary | ICD-10-CM | POA: Insufficient documentation

## 2023-03-16 DIAGNOSIS — N390 Urinary tract infection, site not specified: Secondary | ICD-10-CM | POA: Insufficient documentation

## 2023-03-16 LAB — CBC
HCT: 37.5 % (ref 31.2–41.9)
HGB: 12.1 g/dL (ref 10.9–14.3)
MCH: 28.4 pg (ref 24.7–32.8)
MCHC: 32.3 g/dL (ref 32.3–35.6)
MCV: 87.7 fL (ref 75.5–95.3)
MPV: 10.5 fL (ref 7.9–10.8)
PLATELETS: 195 10*3/uL (ref 140–440)
RBC: 4.28 10*6/uL (ref 3.63–4.92)
RDW: 15 % (ref 12.3–17.7)
WBC: 6 10*3/uL (ref 3.8–11.8)

## 2023-03-16 LAB — COMPREHENSIVE METABOLIC PANEL, NON-FASTING
ALBUMIN/GLOBULIN RATIO: 1.1 (ref 0.8–1.4)
ALBUMIN: 3.9 g/dL (ref 3.5–5.7)
ALKALINE PHOSPHATASE: 77 U/L (ref 34–104)
ALT (SGPT): 13 U/L (ref 7–52)
ANION GAP: 9 mmol/L (ref 4–13)
AST (SGOT): 19 U/L (ref 13–39)
BILIRUBIN TOTAL: 0.5 mg/dL (ref 0.3–1.0)
BUN/CREA RATIO: 16 (ref 6–22)
BUN: 26 mg/dL — ABNORMAL HIGH (ref 7–25)
CALCIUM, CORRECTED: 9 mg/dL (ref 8.9–10.8)
CALCIUM: 8.9 mg/dL (ref 8.6–10.3)
CHLORIDE: 105 mmol/L (ref 98–107)
CO2 TOTAL: 30 mmol/L (ref 21–31)
CREATININE: 1.65 mg/dL — ABNORMAL HIGH (ref 0.60–1.30)
ESTIMATED GFR: 30 mL/min/{1.73_m2} — ABNORMAL LOW (ref >59)
GLOBULIN: 3.6 (ref 2.9–5.4)
GLUCOSE: 127 mg/dL — ABNORMAL HIGH (ref 74–109)
OSMOLALITY, CALCULATED: 293 mOsm/kg — ABNORMAL HIGH (ref 270–290)
POTASSIUM: 4 mmol/L (ref 3.5–5.1)
PROTEIN TOTAL: 7.5 g/dL (ref 6.4–8.9)
SODIUM: 144 mmol/L (ref 136–145)

## 2023-03-16 LAB — VITAMIN D 25 TOTAL: VITAMIN D 25, TOTAL: 47.97 ng/mL (ref 30.00–100.00)

## 2023-03-16 LAB — LIPID PANEL
CHOL/HDL RATIO: 2.9
CHOLESTEROL: 149 mg/dL (ref <200)
HDL CHOL: 52 mg/dL (ref >=40)
LDL CALC: 84 mg/dL (ref 0–100)
TRIGLYCERIDES: 66 mg/dL (ref <=150)
VLDL CALC: 13 mg/dL (ref 0–50)

## 2023-03-16 LAB — HGA1C (HEMOGLOBIN A1C WITH EST AVG GLUCOSE): HEMOGLOBIN A1C: 6.3 % — ABNORMAL HIGH (ref 4.0–6.0)

## 2023-03-16 LAB — URINALYSIS, MACROSCOPIC
BILIRUBIN: NEGATIVE mg/dL
BLOOD: 0.03 mg/dL
GLUCOSE: NEGATIVE mg/dL
KETONES: NEGATIVE mg/dL
LEUKOCYTES: NEGATIVE WBCs/uL
NITRITE: NEGATIVE
PH: 5.5 (ref 5.0–9.0)
PROTEIN: 10 mg/dL
SPECIFIC GRAVITY: 1.021 (ref 1.002–1.030)
UROBILINOGEN: NORMAL mg/dL

## 2023-03-16 LAB — URINALYSIS, MICROSCOPIC
NON-SQUAMOUS EPITHELIAL CELLS URINE: <1 /hpf (ref <=1)
RBCS: 2 /hpf (ref <4)
SQUAMOUS EPITHELIAL: 1 /hpf (ref <28)
WBCS: 2 /hpf (ref <6)

## 2023-03-16 LAB — THYROID STIMULATING HORMONE (SENSITIVE TSH): TSH: 2.495 u[IU]/mL (ref 0.450–5.330)

## 2023-03-18 LAB — URINE CULTURE

## 2023-03-19 LAB — URINE CULTURE: URINE CULTURE: 50000 — AB

## 2023-03-24 LAB — ECG W INTERP (AMB USE ONLY)(MUSE,IN CLINIC)
Atrial Rate: 87 {beats}/min
Calculated P Axis: 43 degrees
Calculated R Axis: -38 degrees
Calculated T Axis: 118 degrees
PR Interval: 168 ms
QRS Duration: 100 ms
QT Interval: 338 ms
QTC Calculation: 406 ms
Ventricular rate: 87 {beats}/min

## 2023-03-31 ENCOUNTER — Other Ambulatory Visit (INDEPENDENT_AMBULATORY_CARE_PROVIDER_SITE_OTHER): Payer: Self-pay

## 2023-04-07 ENCOUNTER — Encounter (INDEPENDENT_AMBULATORY_CARE_PROVIDER_SITE_OTHER): Payer: Medicare PPO

## 2023-04-07 ENCOUNTER — Ambulatory Visit (INDEPENDENT_AMBULATORY_CARE_PROVIDER_SITE_OTHER): Payer: Medicare PPO

## 2023-04-07 ENCOUNTER — Other Ambulatory Visit: Payer: Self-pay

## 2023-04-07 ENCOUNTER — Other Ambulatory Visit: Payer: Medicare PPO

## 2023-04-07 DIAGNOSIS — E559 Vitamin D deficiency, unspecified: Secondary | ICD-10-CM | POA: Insufficient documentation

## 2023-04-07 DIAGNOSIS — N183 Chronic kidney disease, stage 3 unspecified (CMS HCC): Secondary | ICD-10-CM

## 2023-04-07 LAB — CBC WITH DIFF
BASOPHIL #: 0 10*3/uL (ref 0.00–0.10)
BASOPHIL %: 1 % (ref 0–1)
EOSINOPHIL #: 0.1 10*3/uL (ref 0.00–0.50)
EOSINOPHIL %: 2 % (ref 1–7)
HCT: 37.6 % (ref 31.2–41.9)
HGB: 12.2 g/dL (ref 10.9–14.3)
LYMPHOCYTE #: 1.8 10*3/uL (ref 1.00–3.00)
LYMPHOCYTE %: 33 % (ref 16–44)
MCH: 28.4 pg (ref 24.7–32.8)
MCHC: 32.5 g/dL (ref 32.3–35.6)
MCV: 87.5 fL (ref 75.5–95.3)
MONOCYTE #: 0.7 10*3/uL (ref 0.30–1.00)
MONOCYTE %: 12 % (ref 5–13)
MPV: 10.9 fL — ABNORMAL HIGH (ref 7.9–10.8)
NEUTROPHIL #: 2.8 10*3/uL (ref 1.85–7.80)
NEUTROPHIL %: 52 % (ref 43–77)
PLATELETS: 174 10*3/uL (ref 140–440)
RBC: 4.3 10*6/uL (ref 3.63–4.92)
RDW: 15.7 % (ref 12.3–17.7)
WBC: 5.3 10*3/uL (ref 3.8–11.8)

## 2023-04-07 LAB — BASIC METABOLIC PANEL
ANION GAP: 5 mmol/L (ref 4–13)
BUN/CREA RATIO: 14 (ref 6–22)
BUN: 22 mg/dL (ref 7–25)
CALCIUM: 8.9 mg/dL (ref 8.6–10.3)
CHLORIDE: 109 mmol/L — ABNORMAL HIGH (ref 98–107)
CO2 TOTAL: 29 mmol/L (ref 21–31)
CREATININE: 1.56 mg/dL — ABNORMAL HIGH (ref 0.60–1.30)
ESTIMATED GFR: 32 mL/min/{1.73_m2} — ABNORMAL LOW (ref >59)
GLUCOSE: 195 mg/dL — ABNORMAL HIGH (ref 74–109)
OSMOLALITY, CALCULATED: 294 mOsm/kg — ABNORMAL HIGH (ref 270–290)
POTASSIUM: 4.2 mmol/L (ref 3.5–5.1)
SODIUM: 143 mmol/L (ref 136–145)

## 2023-04-07 LAB — PROTEIN/CREATININE RATIO, URINE, RANDOM
CREATININE RANDOM URINE: 137 mg/dL — ABNORMAL HIGH (ref 30–125)
PROTEIN RANDOM URINE: 31 mg/dL — ABNORMAL LOW (ref 50–80)
PROTEIN/CREATININE RATIO RANDOM URINE: 0.226 mg/mg (ref 0.000–200.000)

## 2023-04-07 LAB — MAGNESIUM: MAGNESIUM: 1.7 mg/dL — ABNORMAL LOW (ref 1.9–2.7)

## 2023-04-07 LAB — MICROALBUMIN/CREATININE RATIO, URINE, RANDOM
CREATININE RANDOM URINE: 137 mg/dL — ABNORMAL HIGH (ref 30–125)
MICROALBUMIN RANDOM URINE: <7 mg/dL

## 2023-04-07 LAB — URIC ACID: URIC ACID: 7.6 mg/dL (ref 2.3–7.6)

## 2023-04-07 LAB — VITAMIN D 25 TOTAL: VITAMIN D 25, TOTAL: 43 ng/mL (ref 30.00–100.00)

## 2023-04-08 LAB — PARATHYROID HORMONE (PTH): PTH: 106 pg/mL — ABNORMAL HIGH (ref 12.0–88.0)

## 2023-04-14 ENCOUNTER — Ambulatory Visit (INDEPENDENT_AMBULATORY_CARE_PROVIDER_SITE_OTHER): Payer: Medicare PPO

## 2023-04-14 ENCOUNTER — Encounter (INDEPENDENT_AMBULATORY_CARE_PROVIDER_SITE_OTHER): Payer: Self-pay

## 2023-04-14 ENCOUNTER — Other Ambulatory Visit: Payer: Self-pay

## 2023-04-14 VITALS — BP 145/70 | HR 82 | Ht 63.0 in | Wt 170.0 lb

## 2023-04-14 DIAGNOSIS — E119 Type 2 diabetes mellitus without complications: Secondary | ICD-10-CM

## 2023-04-14 DIAGNOSIS — E559 Vitamin D deficiency, unspecified: Secondary | ICD-10-CM

## 2023-04-14 DIAGNOSIS — N1832 Chronic kidney disease, stage 3b (CMS HCC): Secondary | ICD-10-CM

## 2023-04-14 DIAGNOSIS — N271 Small kidney, bilateral: Secondary | ICD-10-CM

## 2023-04-14 DIAGNOSIS — I10 Essential (primary) hypertension: Secondary | ICD-10-CM

## 2023-04-14 DIAGNOSIS — E1122 Type 2 diabetes mellitus with diabetic chronic kidney disease: Secondary | ICD-10-CM

## 2023-04-14 DIAGNOSIS — I129 Hypertensive chronic kidney disease with stage 1 through stage 4 chronic kidney disease, or unspecified chronic kidney disease: Secondary | ICD-10-CM

## 2023-04-14 NOTE — Progress Notes (Signed)
NEPHROLOGY, [facility]  [address] [place] [address]    Progress Note    Name: Connie Barber MRN:  [id number]   Date: 04/14/2023 DOB:  02-27-1935 (88 y.o.)              Nephrology Out  Patient Visit       HPI : 88 y.o. female presents to office for chronic kidney disease stage IIIB/IV creatinine 1.56 GFR 32.  Past medical history chronic kidney disease, non-insulin-dependent diabetes mellitus type II, hypothyroidism,  hypertension, and gout.  Blood pressure is elevated today.  Patient states she is taking blood pressure at home.  It varies from 120s systolic up to 150's.  Instructed her to keep a blood pressure log.  Blood pressure has been under good control.  Denies complaints of edema.  No edema noted today.  Currently taking hydrochlorothiazide 12.5 mg daily.       ROS:     Assessment was negative except what mentioned in in the HPI.     OBJECTIVE:   BP (!) 159/86   Pulse 82   Ht 1.6 m (5\' 3" )   Wt 77.1 kg (170 lb)   BMI 30.11 kg/m       General:  NAD, AAOx3  HEENT:  EOMI,  no icterus  NECK: No increased JVD.  Normal inspection   HEART:    Regular rhythm. No murmurs or rubs. No chest wall tenderness   LUNGS: Clear to auscultation bilateral. No wheezes, rales, or rhonchi.   ABDOMEN: +BS, Soft, nontender and nondistended. No rebound or guarding present.   EXTREMITIES: No edema. No cyanosis or clubbing.No asterixis    NEURO : Normal speech, EOMI.       LABORATORY DATA:   Lab Results   Component Value Date    BUN 22 04/07/2023    BUN 26 (H) 03/16/2023    BUN 26 (H) 12/31/2022    CREATININE 1.56 (H) 04/07/2023    CREATININE 1.65 (H) 03/16/2023    CREATININE 1.59 (H) 12/31/2022    BUNCRRATIO 14 04/07/2023    BUNCRRATIO 16 03/16/2023    BUNCRRATIO 16 12/31/2022    GFR 32 (L) 04/07/2023    GFR 30 (L) 03/16/2023    GFR 32 (L) 12/31/2022    SODIUM 143 04/07/2023    SODIUM 144 03/16/2023    SODIUM 143 12/31/2022    POTASSIUM 4.2 04/07/2023    POTASSIUM 4.0 03/16/2023    POTASSIUM 4.3  12/31/2022    CHLORIDE 109 (H) 04/07/2023    CHLORIDE 105 03/16/2023    CHLORIDE 108 (H) 12/31/2022    CO2 29 04/07/2023    CO2 30 03/16/2023    CO2 28 12/31/2022    ANIONGAP 5 04/07/2023    ANIONGAP 9 03/16/2023    ANIONGAP 7 12/31/2022    CALCIUM 8.9 04/07/2023    CALCIUM 8.9 03/16/2023    CALCIUM 9.4 12/31/2022    PHOSPHORUS 3.8 12/31/2022    PHOSPHORUS 3.6 (L) 07/15/2022    PHOSPHORUS 4.4 02/10/2022    ALBUMIN 3.9 03/16/2023    ALBUMIN 4.0 12/31/2022    ALBUMIN 3.1 (L) 11/26/2022    HGB 12.2 04/07/2023    HGB 12.1 03/16/2023    HGB 11.9 12/31/2022    HCT 37.6 04/07/2023    HCT 37.5 03/16/2023    HCT 36.9 12/31/2022    INTACTPTH 106.0 (H) 04/07/2023    INTACTPTH 63.5 12/31/2022    INTACTPTH 101.7 (H) 07/15/2022    IRON 64  12/31/2022    IRON 84 07/15/2022    IRON 81 02/10/2022    IRONBINDCAP 314 12/31/2022    IRONBINDCAP 283 07/15/2022    IRONBINDCAP 336 02/10/2022    IRONSAT 20 12/31/2022    IRONSAT 30 07/15/2022    IRONSAT 24 02/10/2022    FERRITIN 69 02/10/2022           MEDICATIONS:  No outpatient medications have been marked as taking for the 04/14/23 encounter (Office Visit) with Cydney Ok, NP.     Current Outpatient Medications   Medication Instructions    amLODIPine (NORVASC) 10 mg, Oral, DAILY    aspirin (ECOTRIN) 81 mg, Oral, DAILY    calcitrioL (ROCALTROL) 0.25 mcg, Oral, DAILY    Colchicine-Probenecid 500-0.5 mg Oral Tablet 1 Tablet, Oral, EVERY EVENING    cyanocobalamin (VITAMIN B 12) 1,000 mcg, Oral, DAILY    ergocalciferol (vitamin D2) (DRISDOL) 50,000 Units, Oral, EVERY 7 DAYS    esomeprazole magnesium (NEXIUM) 40 mg, Oral, DAILY PRN    ezetimibe (ZETIA) 10 mg, Oral, EVERY MORNING    glimepiride (AMARYL) 1 mg, Oral, EVERY MORNING WITH BREAKFAST    glimepiride (AMARYL) 2 mg, Oral, EVERY MORNING WITH BREAKFAST    hydroCHLOROthiazide (HYDRODIURIL) 12.5 mg, Oral, DAILY    levothyroxine (SYNTHROID) 75 mcg, Oral, EVERY MORNING    olmesartan (BENICAR) 40 mg, Oral, DAILY    pravastatin (PRAVACHOL)  10 mg, Oral, EVERY MORNING    SITagliptin phosphate (JANUVIA) 50 mg, Oral, DAILY      ASSESSMENT / PLAN:  ENCOUNTER DIAGNOSES     ICD-10-CM   1. Stage 3b chronic kidney disease (CMS HCC)  N18.32   2. Hypertension, unspecified type  I10   3. DM (diabetes mellitus) (CMS HCC)  E11.9   4. Vitamin D deficiency  E55.9           Chronic kidney disease  -Stage IIIb  -Due to diabetes and hypertension  -Creatinine is stable      -Baseline creatinine 1.9  -Total protein to creatinine ratio-no significant protein in the urine  Lab Results   Component Value Date    UPCR 0.226 04/07/2023    UPCR 0.207 12/31/2022      -CBC and a basic metabolic panel  -Return to clinic in 3 months  -Continue low-sodium diet  -balanced Fluid intake 45 oz a day.    -Avoid NSAIDs.  Tylenol only for pain  -Renal imaging: The kidneys are small bilaterally right more so than left.   No hydronephrosis.    No cysts or large solid renal masses.  -ACEI or ARBS:  Olmesartan 40 mg daily  -Sodium Glucose Cotransporter-2 (SGLT2) Inhibitors: Y  N contraindicated    CKD bone mineral disease  Lab Results   Component Value Date    INTACTPTH 106.0 (H) 04/07/2023    VITAMIND25 43.00 04/07/2023    PHOSPHORUS 3.8 12/31/2022      -continue calcitriol 0.25 mcg daily for total of 90 days.         Hypertension  -Blood pressure is acceptable  -Goal less than 130/80  -Continue olmesartan 40 mg daily  -Low-salt diet          Diabetes type 2  -A1c goal is less than 7   Lab Results   Component Value Date    HA1C 6.3 (H) 03/16/2023      -Continue Januvia   -continue glimepiride  -Diabetic diet  -Increase activity and exercise as possible  -Monitor A1C  Hypomagnesemia  -monitor magnesium level  -continue magnesium supplement          I have discussed with the patient the following issues:  The main goal of treatment is to prevent progression of CKD to complete kidney failure. The best way to do this is to control the underlying cause.  the optimal diet is one similar to  the Dietary Approaches to Stop Hypertension (DASH) diet, consisting of fruits, vegetables, legumes, fish, poultry, and whole grains.  Restrict sodium intake to less than 2 g/day    Orders Placed This Encounter    BASIC METABOLIC PANEL    CBC/DIFF    MAGNESIUM    MICROALBUMIN/CREATININE RATIO, URINE, RANDOM    PARATHYROID HORMONE (PTH)    PROTEIN/CREATININE RATIO, URINE, RANDOM    URIC ACID    VITAMIN D 25 TOTAL         Cydney Ok, NP

## 2023-04-21 IMAGING — MG 2D SCREENING MAMMO BIL
1 series · 4 of 4 positions shown · non-contrast
Comparison: Previous examination from outside dated 07/05/2022.

------------- REPORT [id number] -------------
[facility]
[address]
[place]/MS/MENOUAR, BEKHALED
We wish to report the following on your recent mammography examination. We are sending a report to your referring physician or other health care provider.
INDICATION: Screening mammogram.  Asymptomatic 88-year-old.

[R CC tomo · right · 0.10mm/px · 4 of 4 slices shown]
[im 1/4]
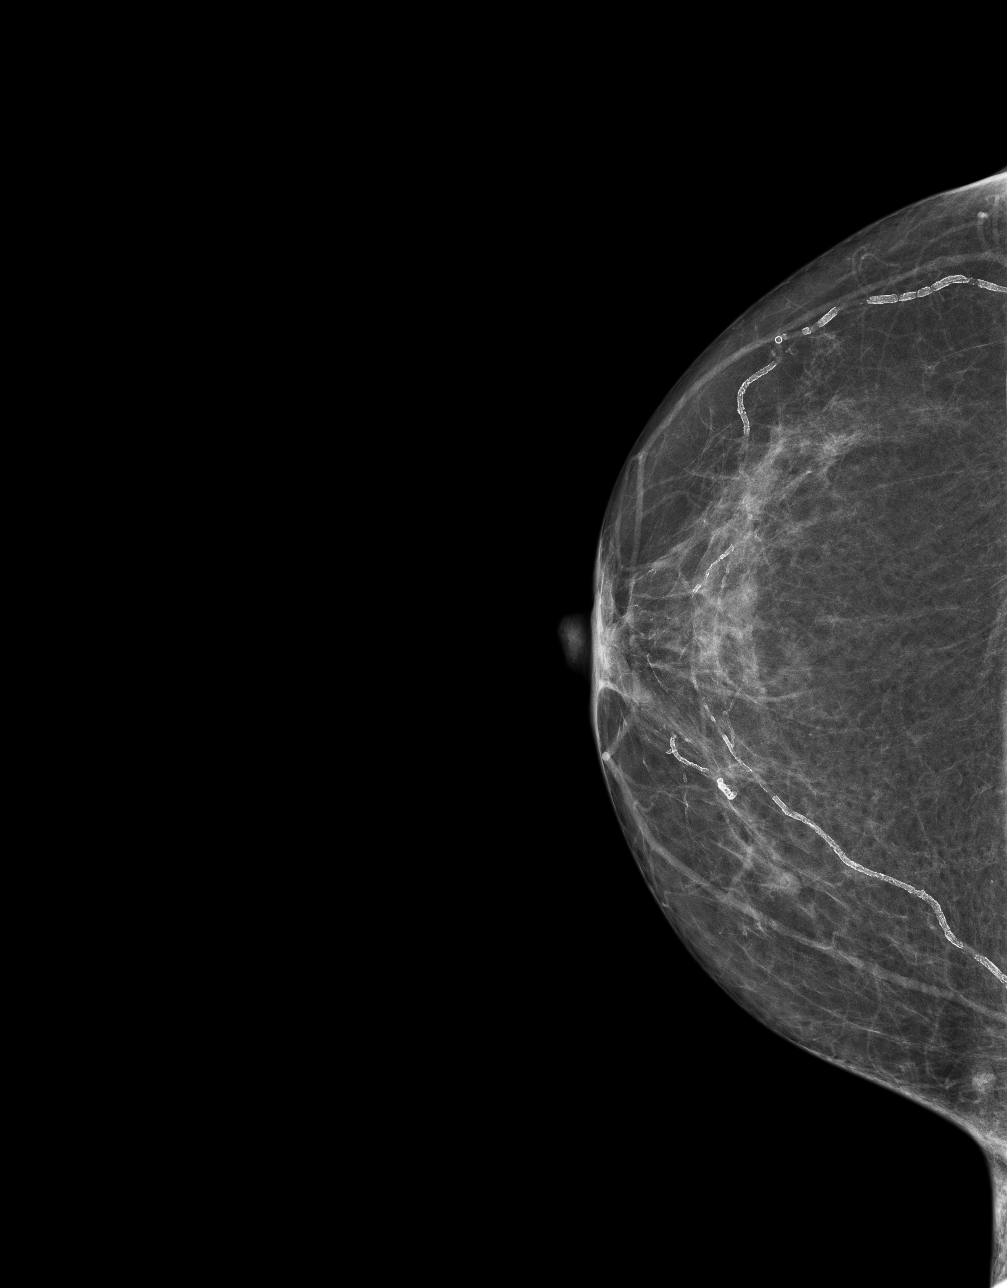
[im 2/4]
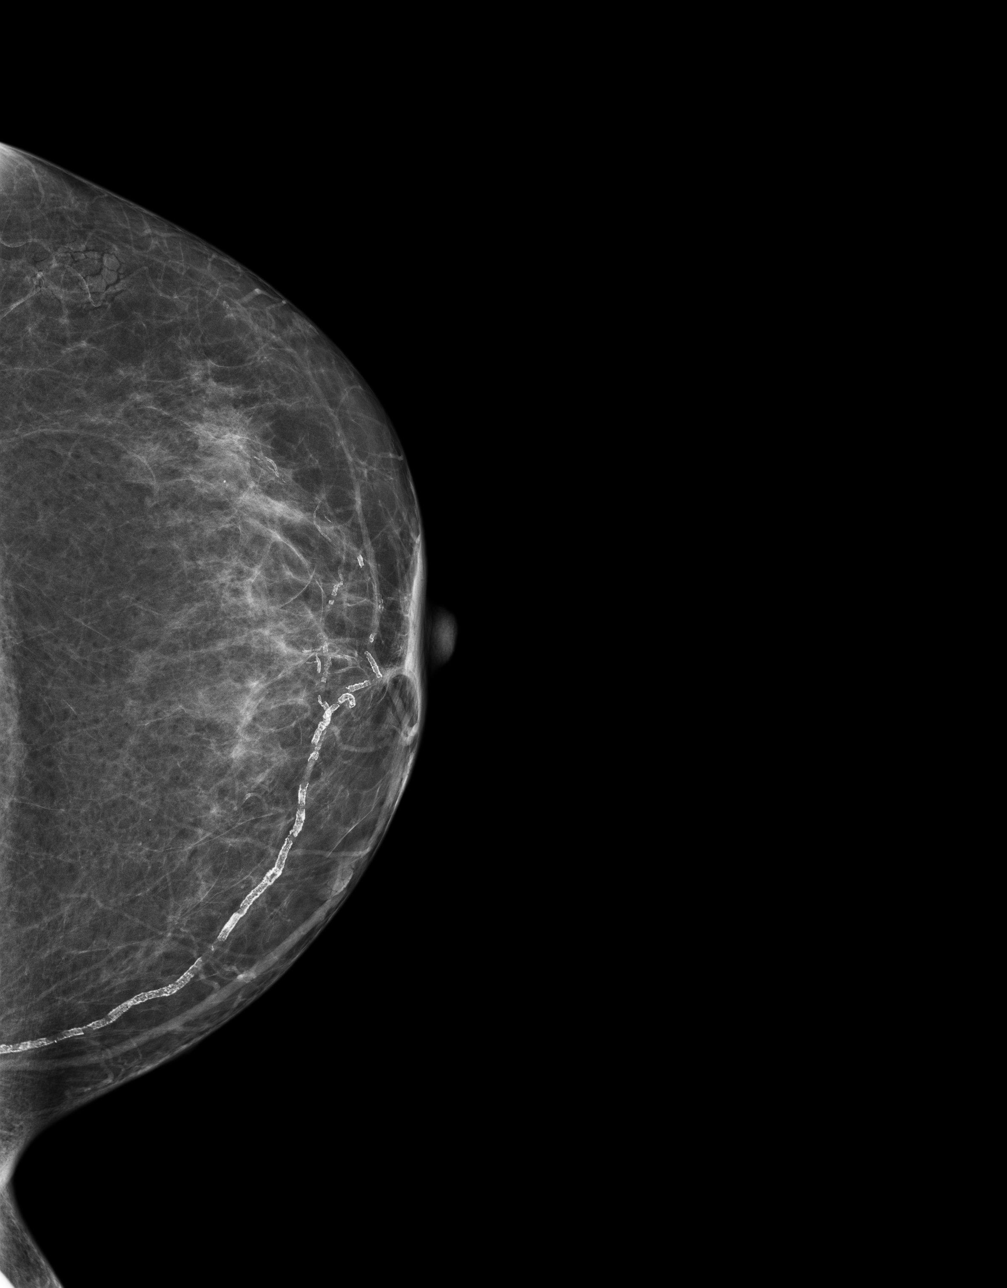
[im 3/4]
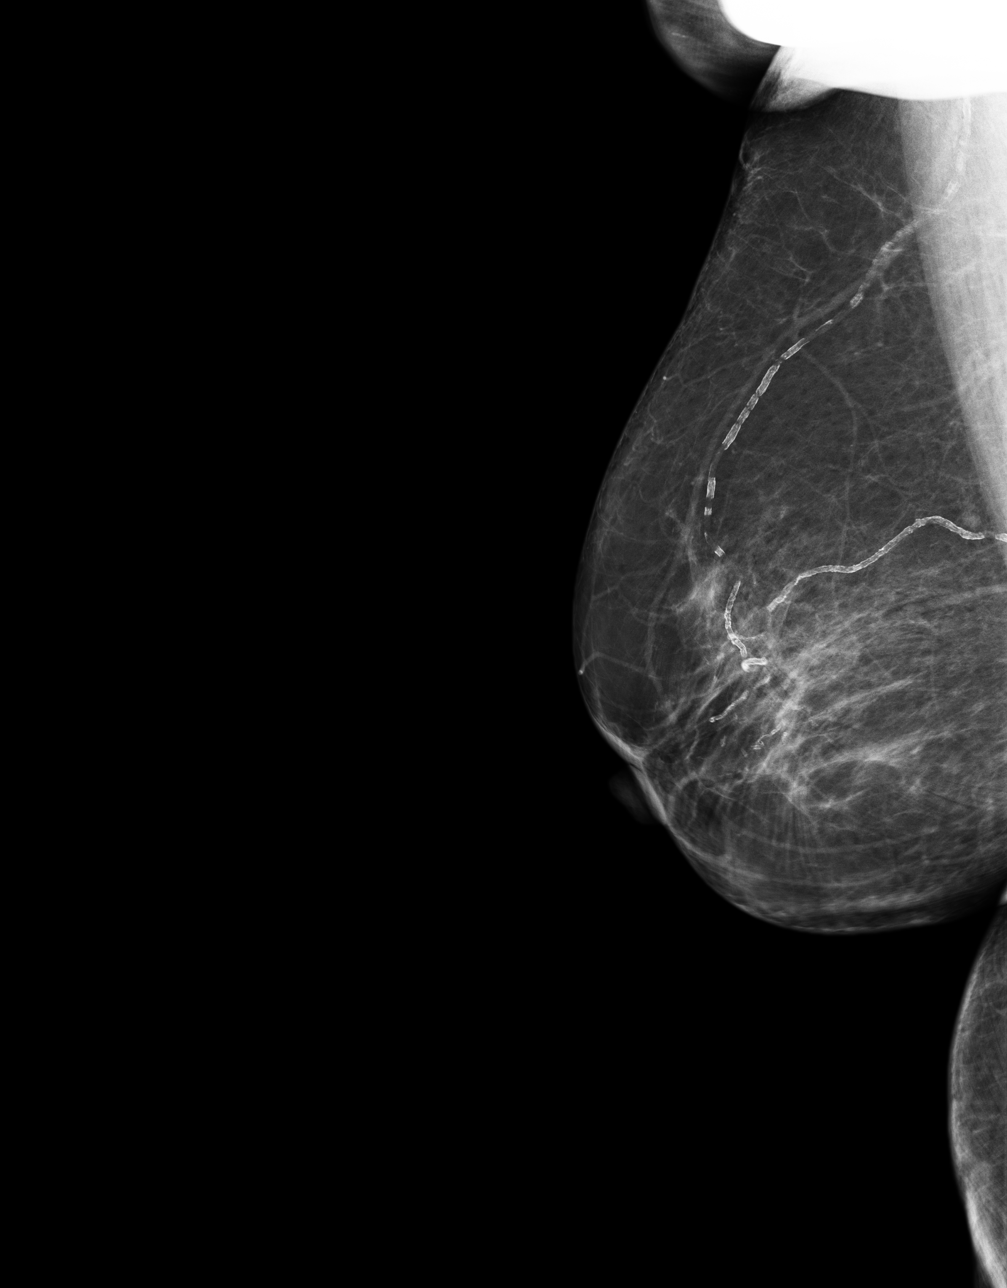
[im 4/4]
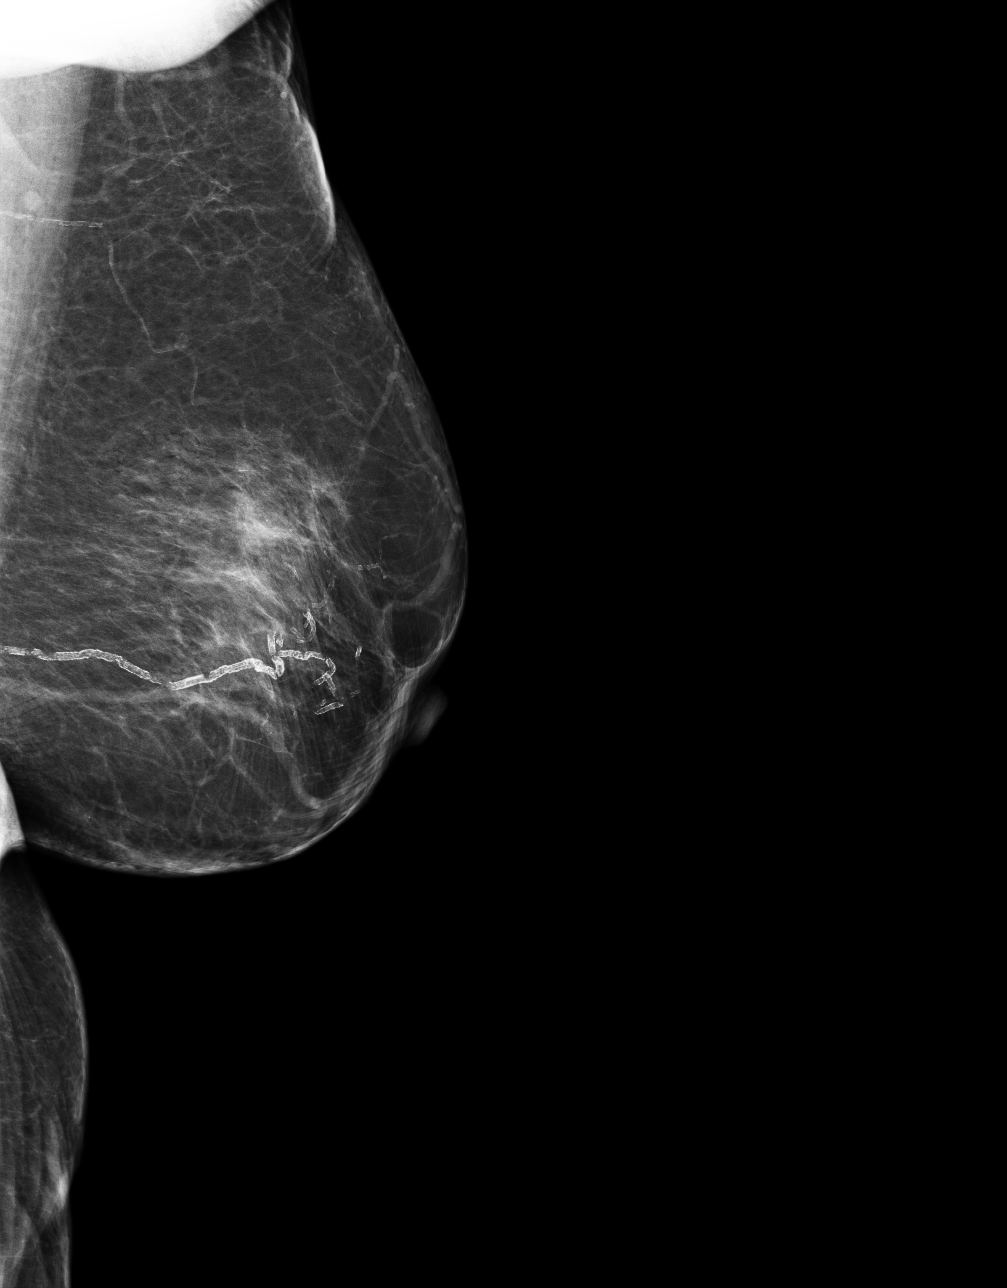

[4 of 4 positions shown; findings below may reference images not displayed]

FINDING: Normal-no evidence of cancer

This statement is mandated by the [place], Department of Health.
Your examination was performed by one of our technologists, who are registered radiological technologists and also specially certified in mammography:
___
Marzan, Heron (M)

Your mammogram was interpreted by our radiologist.

( 
Apple Martha, M.D.

(Annual Breast Examination by a physician or other health care provider
(Annual Mammography Screening beginning at age 40
(Monthly Breast Self Examination

------------- REPORT [id number] -------------
﻿

EXAM:  2D SCREENING MAMMO BIL
FINDINGS: No new mass or architectural changes are noted.  Extensive arterial calcifications on both sides are stable.  No abnormal calcific densities, skin changes, nipple changes or duct dilation are seen.
IMPRESSION: 1.  BIRADS 2-Benign findings. Patient has been added in a reminder system with a target date for the next screening mammography.

2.  DENSITY CODE –  B (Scattered areas of fibroglandular density) 

Final Assessment Code:

BI-RADS 0
 Need additional imaging evaluation.

BI-RADS 1
 Negative mammogram.

BI-RADS 2
 Benign finding.

BI-RADS 3
 Probably benign finding; short-interval follow-up suggested.

BI-RADS 4
 Suspicious abnormality; biopsy should be considered.

BI-RADS 5
 Highly suggestive of malignancy; appropriate action should be taken.

BI-RADS 6
 Known biopsy-proven malignancy; appropriate action should be taken.

NOTE:
In compliance with Federal regulations, the results of this mammogram are being sent to the patient.

## 2023-05-26 ENCOUNTER — Encounter (INDEPENDENT_AMBULATORY_CARE_PROVIDER_SITE_OTHER): Payer: Self-pay | Admitting: INTERVENTIONAL CARDIOLOGY

## 2023-08-04 ENCOUNTER — Encounter (INDEPENDENT_AMBULATORY_CARE_PROVIDER_SITE_OTHER): Payer: Self-pay

## 2023-08-11 ENCOUNTER — Other Ambulatory Visit: Payer: Self-pay

## 2023-08-11 ENCOUNTER — Other Ambulatory Visit: Payer: Commercial Managed Care - PPO

## 2023-08-11 ENCOUNTER — Ambulatory Visit (INDEPENDENT_AMBULATORY_CARE_PROVIDER_SITE_OTHER): Payer: Commercial Managed Care - PPO

## 2023-08-11 DIAGNOSIS — N1832 Chronic kidney disease, stage 3b (CMS HCC): Secondary | ICD-10-CM

## 2023-08-11 DIAGNOSIS — E559 Vitamin D deficiency, unspecified: Secondary | ICD-10-CM | POA: Insufficient documentation

## 2023-08-11 LAB — CBC WITH DIFF
BASOPHIL #: 0.1 10*3/uL (ref 0.00–0.10)
BASOPHIL %: 1 % (ref 0–1)
EOSINOPHIL #: 0.1 10*3/uL (ref 0.00–0.50)
EOSINOPHIL %: 1 % (ref 1–7)
HCT: 38.4 % (ref 31.2–41.9)
HGB: 12.6 g/dL (ref 10.9–14.3)
LYMPHOCYTE #: 1.9 10*3/uL (ref 1.00–3.00)
LYMPHOCYTE %: 27 % (ref 16–44)
MCH: 28.9 pg (ref 24.7–32.8)
MCHC: 32.8 g/dL (ref 32.3–35.6)
MCV: 88.2 fL (ref 75.5–95.3)
MONOCYTE #: 0.6 10*3/uL (ref 0.30–1.00)
MONOCYTE %: 9 % (ref 5–13)
MPV: 11.1 fL — ABNORMAL HIGH (ref 7.9–10.8)
NEUTROPHIL #: 4.1 10*3/uL (ref 1.85–7.80)
NEUTROPHIL %: 61 % (ref 43–77)
PLATELETS: 170 10*3/uL (ref 140–440)
RBC: 4.35 10*6/uL (ref 3.63–4.92)
RDW: 15.6 % (ref 12.3–17.7)
WBC: 6.7 10*3/uL (ref 3.8–11.8)

## 2023-08-11 LAB — BASIC METABOLIC PANEL
ANION GAP: 9 mmol/L (ref 4–13)
BUN/CREA RATIO: 11 (ref 6–22)
BUN: 16 mg/dL (ref 7–25)
CALCIUM: 9.2 mg/dL (ref 8.6–10.3)
CHLORIDE: 106 mmol/L (ref 98–107)
CO2 TOTAL: 28 mmol/L (ref 21–31)
CREATININE: 1.49 mg/dL — ABNORMAL HIGH (ref 0.60–1.30)
ESTIMATED GFR: 34 mL/min/{1.73_m2} — ABNORMAL LOW (ref >59)
GLUCOSE: 197 mg/dL — ABNORMAL HIGH (ref 74–109)
OSMOLALITY, CALCULATED: 292 mosm/kg — ABNORMAL HIGH (ref 270–290)
POTASSIUM: 4.3 mmol/L (ref 3.5–5.1)
SODIUM: 143 mmol/L (ref 136–145)

## 2023-08-11 LAB — PROTEIN/CREATININE RATIO, URINE, RANDOM
CREATININE RANDOM URINE: 153 mg/dL — ABNORMAL HIGH (ref 30–125)
PROTEIN RANDOM URINE: 103 mg/dL — ABNORMAL HIGH (ref 50–80)
PROTEIN/CREATININE RATIO RANDOM URINE: 0.673 mg/mg (ref 0.000–200.000)

## 2023-08-11 LAB — MICROALBUMIN/CREATININE RATIO, URINE, RANDOM
CREATININE RANDOM URINE: 153 mg/dL — ABNORMAL HIGH (ref 30–125)
MICROALBUMIN RANDOM URINE: 55.1 mg/dL
MICROALBUMIN/CREATININE RATIO RANDOM URINE: 360.1 mg/g

## 2023-08-11 LAB — URIC ACID: URIC ACID: 6 mg/dL (ref 2.3–7.6)

## 2023-08-11 LAB — MAGNESIUM: MAGNESIUM: 1.7 mg/dL — ABNORMAL LOW (ref 1.9–2.7)

## 2023-08-11 LAB — PARATHYROID HORMONE (PTH): PTH: 143.7 pg/mL — ABNORMAL HIGH (ref 12.0–88.0)

## 2023-08-11 LAB — VITAMIN D 25 TOTAL: VITAMIN D 25, TOTAL: 29.73 ng/mL — ABNORMAL LOW (ref 30.00–100.00)

## 2023-08-18 ENCOUNTER — Encounter (INDEPENDENT_AMBULATORY_CARE_PROVIDER_SITE_OTHER): Payer: Self-pay

## 2023-08-18 ENCOUNTER — Other Ambulatory Visit: Payer: Self-pay

## 2023-08-18 ENCOUNTER — Ambulatory Visit (INDEPENDENT_AMBULATORY_CARE_PROVIDER_SITE_OTHER): Payer: Commercial Managed Care - PPO

## 2023-08-18 VITALS — BP 169/71 | HR 91 | Ht 63.0 in | Wt 186.0 lb

## 2023-08-18 DIAGNOSIS — N271 Small kidney, bilateral: Secondary | ICD-10-CM

## 2023-08-18 DIAGNOSIS — N1832 Chronic kidney disease, stage 3b (CMS HCC): Secondary | ICD-10-CM

## 2023-08-18 DIAGNOSIS — E1122 Type 2 diabetes mellitus with diabetic chronic kidney disease: Secondary | ICD-10-CM

## 2023-08-18 DIAGNOSIS — E559 Vitamin D deficiency, unspecified: Secondary | ICD-10-CM

## 2023-08-18 DIAGNOSIS — I10 Essential (primary) hypertension: Secondary | ICD-10-CM

## 2023-08-18 DIAGNOSIS — I129 Hypertensive chronic kidney disease with stage 1 through stage 4 chronic kidney disease, or unspecified chronic kidney disease: Secondary | ICD-10-CM

## 2023-08-18 DIAGNOSIS — Z7984 Long term (current) use of oral hypoglycemic drugs: Secondary | ICD-10-CM

## 2023-08-18 MED ORDER — EMPAGLIFLOZIN 10 MG TABLET
10.0000 mg | ORAL_TABLET | Freq: Every day | ORAL | 3 refills | Status: AC
Start: 2023-08-18 — End: 2023-11-24

## 2023-08-18 NOTE — Progress Notes (Signed)
[facility]  [address] [place] [address]    Progress Note    Name: Connie Barber MRN:  [id number]   Date: 08/18/2023 DOB:  Jan 21, 1935 (89 y.o.)              Nephrology Out  Patient Visit       HPI : 89 y.o.  female her for a follow up chronic kidney disease stage IIIb: Creatinine 1.49 GFR 34.  Past medical history chronic kidney disease, non-insulin-dependent diabetes mellitus type II, hypothyroidism,  hypertension, and gout. Denies complaints of edema.  No edema noted today.  Denies hematuria and dyuria. Blood pressure elevated this visit.       ROS:     Assessment  was negative except what mentioned in in the HPI.     OBJECTIVE:   BP (!) 169/71   Pulse 91   Ht 1.6 m (5\' 3" )   Wt 84.4 kg (186 lb)   BMI 32.95 kg/m       General:  NAD, AAOx3  HEENT:  EOMI,  no icterus  NECK: No increased JVD.  Normal inspection   HEART: Normal S1 and S2. Regular rhythm. No murmurs or rubs. No chest wall tenderness   LUNGS: Clear to auscultation bilateral. No wheezes, rales, or rhonchi.   ABDOMEN: +BS, Soft, nontender and nondistended. No rebound or guarding present.   EXTREMITIES: No edema. No cyanosis or clubbing.No asterixis    NEURO : Normal speech, EOMI.       LABORATORY DATA:   Lab Results   Component Value Date    BUN 16 08/11/2023    BUN 22 04/07/2023    BUN 26 (H) 03/16/2023    CREATININE 1.49 (H) 08/11/2023    CREATININE 1.56 (H) 04/07/2023    CREATININE 1.65 (H) 03/16/2023    BUNCRRATIO 11 08/11/2023    BUNCRRATIO 14 04/07/2023    BUNCRRATIO 16 03/16/2023    GFR 34 (L) 08/11/2023    GFR 32 (L) 04/07/2023    GFR 30 (L) 03/16/2023    SODIUM 143 08/11/2023    SODIUM 143 04/07/2023    SODIUM 144 03/16/2023    POTASSIUM 4.3 08/11/2023    POTASSIUM 4.2 04/07/2023    POTASSIUM 4.0 03/16/2023    CHLORIDE 106 08/11/2023    CHLORIDE 109 (H) 04/07/2023    CHLORIDE 105 03/16/2023    CO2 28 08/11/2023    CO2 29 04/07/2023    CO2 30 03/16/2023    ANIONGAP 9 08/11/2023    ANIONGAP 5 04/07/2023     ANIONGAP 9 03/16/2023    CALCIUM 9.2 08/11/2023    CALCIUM 8.9 04/07/2023    CALCIUM 8.9 03/16/2023    PHOSPHORUS 3.8 12/31/2022    PHOSPHORUS 3.6 (L) 07/15/2022    PHOSPHORUS 4.4 02/10/2022    ALBUMIN 3.9 03/16/2023    ALBUMIN 4.0 12/31/2022    ALBUMIN 3.1 (L) 11/26/2022    HGB 12.6 08/11/2023    HGB 12.2 04/07/2023    HGB 12.1 03/16/2023    HCT 38.4 08/11/2023    HCT 37.6 04/07/2023    HCT 37.5 03/16/2023    INTACTPTH 143.7 (H) 08/11/2023    INTACTPTH 106.0 (H) 04/07/2023    INTACTPTH 63.5 12/31/2022    IRON 64 12/31/2022    IRON 84 07/15/2022    IRON 81 02/10/2022    IRONBINDCAP 314 12/31/2022    IRONBINDCAP 283 07/15/2022    IRONBINDCAP 336 02/10/2022    IRONSAT 20 12/31/2022  IRONSAT 30 07/15/2022    IRONSAT 24 02/10/2022    FERRITIN 69 02/10/2022           MEDICATIONS:  No outpatient medications have been marked as taking for the 08/18/23 encounter (Office Visit) with Cydney Ok, NP.     Current Outpatient Medications   Medication Instructions    amLODIPine (NORVASC) 10 mg, Oral, DAILY    aspirin (ECOTRIN) 81 mg, Oral, DAILY    calcitrioL (ROCALTROL) 0.25 mcg, Oral, DAILY    Colchicine-Probenecid 500-0.5 mg Oral Tablet 1 Tablet, Oral, EVERY EVENING    cyanocobalamin (VITAMIN B 12) 1,000 mcg, Oral, DAILY    ergocalciferol (vitamin D2) (DRISDOL) 50,000 Units, Oral, EVERY 7 DAYS    ezetimibe (ZETIA) 10 mg, Oral, EVERY MORNING    glimepiride (AMARYL) 1 mg, Oral, EVERY MORNING WITH BREAKFAST    glimepiride (AMARYL) 2 mg, Oral, EVERY MORNING WITH BREAKFAST    hydroCHLOROthiazide (HYDRODIURIL) 12.5 mg, Oral, DAILY    levothyroxine (SYNTHROID) 75 mcg, Oral, EVERY MORNING    olmesartan (BENICAR) 40 mg, Oral, DAILY    pravastatin (PRAVACHOL) 10 mg, Oral, EVERY MORNING    SITagliptin phosphate (JANUVIA) 50 mg, Oral, DAILY      ASSESSMENT / PLAN:  ENCOUNTER DIAGNOSES     ICD-10-CM   1. Stage 3b chronic kidney disease (CMS HCC)  N18.32   2. Hypertension, unspecified type  I10   3. Vitamin D deficiency  E55.9            Chronic kidney disease  -Stage IIIb  -Due to diabetes and hypertension  -Creatinine is 1.49/GFR 34  -Baseline creatinine : 1.6   -Total protein to creatinine ratio- 0.7  Lab Results   Component Value Date    UPCR 0.673 08/11/2023    UPCR 0.226 04/07/2023      -CBC and a basic metabolic panel  -Return to clinic in 3 months  -Continue low-sodium diet  -balanced Fluid intake 45 oz a day.    -Avoid NSAIDs.  Tylenol only for pain  -Renal imaging: No hydronephrosis both kidneys are small in size  -olmesartan 40 mg daily  -Sodium Glucose Cotransporter-2 (SGLT2) Inhibitors: Y  N contraindicated          CKD bone mineral disease  Lab Results   Component Value Date    INTACTPTH 143.7 (H) 08/11/2023    GNFAOZHY86 29.73 (L) 08/11/2023    PHOSPHORUS 3.8 12/31/2022    -calcitriol 0 5 mcg  -continue vitamin-D supplements 2 months 50000 units weekly  -monitor PTH            Hypertension  -Blood pressure is acceptable  -Goal less than 130/80  -Continue olmesartan 40 mg daily  -amlodipine 10 mg daily  -hydrochlorothiazide 12.5 mg  -Low-salt diet        Diabetes type 2  -A1c goal is less than 7   Lab Results   Component Value Date    HA1C 6.3 (H) 03/16/2023      -Continue Jardiance 10 mg daily   -glimepiride 1 mg daily   -Diabetic diet  -Increase activity and exercise as possible  -Monitor A1C          I have discussed with the patient the following issues:  The main goal of treatment is to prevent progression of CKD to complete kidney failure. The best way to do this is to control the underlying cause.  the optimal diet is one similar to the Dietary Approaches to Stop Hypertension (DASH) diet, consisting of  fruits, vegetables, legumes, fish, poultry, and whole grains.  Restrict sodium intake to less than 2 g/day        Orders Placed This Encounter    BASIC METABOLIC PANEL    CBC/DIFF    MAGNESIUM    MICROALBUMIN URINE, RANDOM    VITAMIN D 25 TOTAL    URIC ACID    PROTEIN/CREATININE RATIO, URINE, RANDOM    PARATHYROID  HORMONE (PTH)    empagliflozin (JARDIANCE) 10 mg Oral Tablet    olmesartan (BENICAR) 40 mg Oral Tablet         Cydney Ok, NP

## 2023-08-18 NOTE — Nursing Note (Signed)
Follow up labs

## 2023-08-19 ENCOUNTER — Ambulatory Visit (INDEPENDENT_AMBULATORY_CARE_PROVIDER_SITE_OTHER): Payer: Self-pay | Admitting: NURSE PRACTITIONER

## 2023-08-21 MED ORDER — OLMESARTAN 40 MG TABLET
40.0000 mg | ORAL_TABLET | Freq: Every day | ORAL | 3 refills | Status: DC
Start: 2023-08-21 — End: 2023-10-27

## 2023-09-02 ENCOUNTER — Ambulatory Visit (INDEPENDENT_AMBULATORY_CARE_PROVIDER_SITE_OTHER): Payer: Self-pay | Admitting: NURSE PRACTITIONER

## 2023-09-14 ENCOUNTER — Other Ambulatory Visit: Payer: Self-pay

## 2023-09-14 ENCOUNTER — Ambulatory Visit: Payer: Commercial Managed Care - PPO

## 2023-09-14 DIAGNOSIS — E039 Hypothyroidism, unspecified: Secondary | ICD-10-CM | POA: Insufficient documentation

## 2023-09-14 DIAGNOSIS — R7989 Other specified abnormal findings of blood chemistry: Secondary | ICD-10-CM | POA: Insufficient documentation

## 2023-09-14 DIAGNOSIS — E785 Hyperlipidemia, unspecified: Secondary | ICD-10-CM | POA: Insufficient documentation

## 2023-09-14 DIAGNOSIS — E119 Type 2 diabetes mellitus without complications: Secondary | ICD-10-CM | POA: Insufficient documentation

## 2023-09-14 LAB — URINALYSIS, MACROSCOPIC
BILIRUBIN: NEGATIVE mg/dL
BLOOD: 0.06 mg/dL — AB
GLUCOSE: NEGATIVE mg/dL
KETONES: NEGATIVE mg/dL
LEUKOCYTES: 75 WBCs/uL — AB
NITRITE: NEGATIVE
PH: 5.5 (ref 5.0–9.0)
PROTEIN: 30 mg/dL — AB
SPECIFIC GRAVITY: >1.03 — ABNORMAL HIGH (ref 1.002–1.030)
UROBILINOGEN: NORMAL mg/dL

## 2023-09-14 LAB — LIPID PANEL
CHOL/HDL RATIO: 2.8
CHOLESTEROL: 142 mg/dL (ref <200)
HDL CHOL: 51 mg/dL (ref >=40)
LDL CALC: 73 mg/dL (ref 0–100)
TRIGLYCERIDES: 88 mg/dL (ref <=150)
VLDL CALC: 18 mg/dL (ref 0–50)

## 2023-09-14 LAB — VITAMIN D 25 TOTAL: VITAMIN D 25, TOTAL: 37.64 ng/mL (ref 30.00–100.00)

## 2023-09-14 LAB — THYROID STIMULATING HORMONE (SENSITIVE TSH): TSH: 4.394 u[IU]/mL (ref 0.450–5.330)

## 2023-09-14 LAB — URINALYSIS, MICROSCOPIC
RBCS: 30 /[HPF] — ABNORMAL HIGH (ref <4)
SQUAMOUS EPITHELIAL: 30 /[HPF] — ABNORMAL HIGH (ref <28)
WBCS: 5 /[HPF] (ref <6)

## 2023-09-15 LAB — HGA1C (HEMOGLOBIN A1C WITH EST AVG GLUCOSE): HEMOGLOBIN A1C: 6.8 % — ABNORMAL HIGH (ref 4.0–6.0)

## 2023-09-16 LAB — URINE CULTURE: URINE CULTURE: NO GROWTH — AB

## 2023-10-27 ENCOUNTER — Encounter (INDEPENDENT_AMBULATORY_CARE_PROVIDER_SITE_OTHER): Payer: Self-pay | Admitting: INTERVENTIONAL CARDIOLOGY

## 2023-10-27 ENCOUNTER — Ambulatory Visit: Payer: Self-pay | Attending: INTERVENTIONAL CARDIOLOGY | Admitting: NURSE PRACTITIONER

## 2023-10-27 ENCOUNTER — Other Ambulatory Visit: Payer: Self-pay

## 2023-10-27 VITALS — BP 173/74 | HR 64 | Ht 63.0 in | Wt 172.5 lb

## 2023-10-27 DIAGNOSIS — Z7982 Long term (current) use of aspirin: Secondary | ICD-10-CM

## 2023-10-27 DIAGNOSIS — I10 Essential (primary) hypertension: Secondary | ICD-10-CM | POA: Insufficient documentation

## 2023-10-27 DIAGNOSIS — I2510 Atherosclerotic heart disease of native coronary artery without angina pectoris: Secondary | ICD-10-CM | POA: Insufficient documentation

## 2023-10-27 DIAGNOSIS — Z79899 Other long term (current) drug therapy: Secondary | ICD-10-CM

## 2023-10-27 DIAGNOSIS — E785 Hyperlipidemia, unspecified: Secondary | ICD-10-CM | POA: Insufficient documentation

## 2023-10-27 DIAGNOSIS — R9431 Abnormal electrocardiogram [ECG] [EKG]: Secondary | ICD-10-CM

## 2023-10-27 DIAGNOSIS — I5030 Unspecified diastolic (congestive) heart failure: Secondary | ICD-10-CM | POA: Insufficient documentation

## 2023-10-27 LAB — ECG W INTERP (AMB USE ONLY)(MUSE,IN CLINIC)
Atrial Rate: 64 {beats}/min
Calculated P Axis: 97 degrees
Calculated R Axis: -37 degrees
Calculated T Axis: -61 degrees
PR Interval: 162 ms
QRS Duration: 104 ms
QT Interval: 416 ms
QTC Calculation: 429 ms
Ventricular rate: 64 {beats}/min

## 2023-10-27 MED ORDER — TELMISARTAN 80 MG TABLET
80.0000 mg | ORAL_TABLET | Freq: Every day | ORAL | 4 refills | Status: DC
Start: 2023-10-27 — End: 2023-11-24

## 2023-10-27 MED ORDER — TELMISARTAN 80 MG TABLET
80.0000 mg | ORAL_TABLET | Freq: Every day | ORAL | 4 refills | Status: DC
Start: 2023-10-27 — End: 2023-10-27

## 2023-10-27 NOTE — Progress Notes (Signed)
 [facility]     [place], [place]  M, 89 y.o. female  Date of Service: 10/27/2023  Date of Birth:  09-30-1934  PCP:  Temple Feeler, MD  Chief Complaint   Patient presents with    Heart Disease    Hypertension    Follow Up     HFpEF        HPI:    The patient has a history of CAD. In Jabril Pursell 2016 she underwent cardiac catheterization due to abnormal stress test which revealed a small to moderate apical reversible perfusion defect. Cardiac catheterization was then performed and this revealed EF 50% with maybe some slight basal inferior akinesis and some questionable anterior hypokinesis. The right coronary artery was a small nondominant vessel. LAD was a large vessel wrapped around the apex and had 50% mid stenosis. Left circumflex was a dominant vessel appeared to be angiographically normal. The patient has echocardiogram in October 2015, which was unremarkable with normal LV function. No significant valvular disease. Holter recording has been okay. Lipids have been well controlled. Despite this, was started on low dose statin due to her moderate CAD. Repeat echocardiogram in December 2016 revealed EF of 55 to 60%. Normal study.  Repeat echocardiogram on 09-17-20 which showed normal LV function, EF 60%.  There is moderate diastolic dysfunction.  Mild LVH and mild left atrial enlargement.      05-10-21 The patient is here for routine follow-up for CAD.  She denies any chest pains.  She does have some shortness of breath with exertion, but this is unchanged.  She is tolerating medications well.  Blood pressure was elevated today.  She states her blood pressure cuff at home broke so she has not had 1, but she decided that when sent to her.  She states previously her blood pressure usually stays in the 130s systolic.  She did have labs last month which looked really good.  Her A1c had improved to 6.5, total cholesterol 135, triglycerides 52, HDL 44, LDL 81, BUN 27, creatinine 1.5, sodium 143,  potassium 4.2.     11/19/2021: The patient is here for routine follow up for CAD.  She denies any chest pain or palpitations.  She does report she has shortness of breath chronically if common stairs otherwise denies any PND or orthopnea.  She does have bilateral lower extremity edema that she says comes and goes in regards to worsening.  She is diabeti,  last known A1c was 6.5.  The blood pressure is elevated today however home readings have been 130s to 140s most of the time.  She had recent labs with her primary care however I do not have a copy of those reports at this time.     05/20/22 The patient is here for CAD f/u.  She denies any chest pains.  She does get shortness of breath going up stairs, but this is unchanged for her.  She is tolerating medications well blood pressure was elevated today, she was checking.  She states she is supposed to be getting a new blood pressure cuff.  Her swelling in her legs has been stable.  She does notice if she eats more salt or keeps her legs down in his worse.  She has not been taking fluid medicine though.  If she props her legs up for swelling usually improves.     11/18/2022: The patient is here for CAD follow up.  She denies any chest pain or palpitations.  She does get short of  breath when she walks up stairs this is chronic and unchanged.  Tolerating medications without difficulty.  Blood pressure is elevated arrival however prior to leaving home it was 138/70 she does check regularly.  She does have chronic lower extremity edema that worsened throughout the day and resolve overnight or elevation.  She does continue to follow with Nephrology for chronic kidney disease.  No new concerns voiced.     12/17/2022: The patient is here for CAD follow up. She was here 1 month ago and was doing well. She denies any She denies any chest pain or palpitations.  She does get short of breath when she walks up stairs this is chronic and unchanged.  Tolerating medications without  difficulty.  Blood pressure is elevated arrival however prior to leaving home it was 13-140/ 70's she does check regularly.  She does have chronic LE swelling that resolves overnight. No new concerns voiced.     10/27/2023 the patient presents for routine follow-up for coronary artery disease and hypertension.  She denies any chest pain.  She has chronic dyspnea on exertion with her asthma this has been stable for her.  The blood pressure is elevated today it is elevated at times at home as well. She is re-starting the Jardiance .        EKG:  Sinus rhythm 64 beats per minute, left axis deviation, minimal voltage criteria for LVH, ST and T-wave abnormality consider inferior ischemia  LAB:   08/2023 LDL 73, A1c 6.8, creatinine 1.49, potassium 4.3    Past Medical History:   Diagnosis Date    CKD (chronic kidney disease)     Stage 3b    Coronary artery disease     Diabetes mellitus     Dyslipidemia     Essential hypertension     Hepatitis C     Wears dentures     Wears glasses        Past Surgical History:   Procedure Laterality Date    CARDIAC CATHETERIZATION  11/20/2014    HX APPENDECTOMY      HX HYSTERECTOMY         Current Outpatient Medications   Medication Sig    amLODIPine  (NORVASC) 10 mg Oral Tablet Take 1 Tablet (10 mg total) by mouth Once a day for 90 days    aspirin  (ECOTRIN) 81 mg Oral Tablet, Delayed Release (E.C.) Take 1 Tablet (81 mg total) by mouth Daily    calcitrioL  (ROCALTROL ) 0.25 mcg Oral Capsule Take 1 Capsule (0.25 mcg total) by mouth Daily    Colchicine-Probenecid 500-0.5 mg Oral Tablet Take 1 Tablet by mouth Every evening    cyanocobalamin  (VITAMIN B 12) 1,000 mcg Oral Tablet Take 1 Tablet (1,000 mcg total) by mouth Daily    empagliflozin  (JARDIANCE ) 10 mg Oral Tablet Take 1 Tablet (10 mg total) by mouth Once a day for 90 days (Patient not taking: Reported on 10/27/2023)    ergocalciferol , vitamin D2, (DRISDOL) 1,250 mcg (50,000 unit) Oral Capsule Take 1 Capsule (50,000 Units total) by mouth Every 7  days    ezetimibe  (ZETIA ) 10 mg Oral Tablet Take 1 Tablet (10 mg total) by mouth Every morning for 180 days    glimepiride (AMARYL) 1 mg Oral Tablet Take 1 Tablet (1 mg total) by mouth Every morning with breakfast    glimepiride (AMARYL) 2 mg Oral Tablet Take 1 Tablet (2 mg total) by mouth Every morning with breakfast    hydroCHLOROthiazide (HYDRODIURIL) 12.5 mg Oral Tablet Take 1  Tablet (12.5 mg total) by mouth Once a day    levothyroxine (SYNTHROID) 75 mcg Oral Tablet Take 1 Tablet (75 mcg total) by mouth Every morning    pravastatin (PRAVACHOL) 10 mg Oral Tablet Take 1 Tablet (10 mg total) by mouth Every morning    SITagliptin phosphate (JANUVIA) 50 mg Oral Tablet Take 1 Tablet (50 mg total) by mouth Daily    telmisartan  (MICARDIS ) 80 mg Oral Tablet Take 1 Tablet (80 mg total) by mouth Daily     ROS: Other than issues noted in HPI, all other systems were negative.     Exam:  Vitals:    10/27/23 1024   BP: (!) 173/74   Pulse: 64   SpO2: 92%   Weight: 78.2 kg (172 lb 8 oz)   Height: 1.6 m (5\' 3" )   BMI: 30.56       General: No acute distress and appears stated age.    Neck: No JVD, no carotid bruit. and supple, symmetrical, trachea midline.   Lungs: Clear to diminished auscultation bilaterally.    Cardiovascular: Regular rate and rhythm, normal S1 S2, no murmur, no rub, or gallop, no thrill     Abdomen: Soft, non-tender and bowel sounds normal.    Extremities: Extremities normal, atraumatic, no cyanosis 2+ble  edema.    Skin: Skin warm and dry.    Neurologic: Alert and oriented x3. Walks with cane      Orders placed this visit:  Orders Placed This Encounter    EKG (In-Clinic Today)    telmisartan  (MICARDIS ) 80 mg Oral Tablet       Assessment/Plan:  Coronary atherosclerosis of native coronary artery    Heart failure with preserved ejection fraction, unspecified HF chronicity (CMS HCC)    Essential hypertension    Dyslipidemia    The blood pressure is uncontrolled today and appears to be uncontrolled at home.  We  will modify her regimen.  Also advised low-salt diet.    Continue amlodipine  10 mg daily  Continue aspirin  81 mg daily  Continue Jardiance  10 mg daily  Continues Zetia  10 mg daily  Continue HCTZ 12.5 mg daily  Continue Pravachol 10 mg daily  Discontinue olmesartan  start telmisartan  80 mg once daily  Continue to assess for change in symptoms.  Return to clinic in 1 year    Hanni Milford Parral, [facility] 10/27/2023 11:40

## 2023-11-09 ENCOUNTER — Encounter (INDEPENDENT_AMBULATORY_CARE_PROVIDER_SITE_OTHER): Payer: Self-pay

## 2023-11-10 ENCOUNTER — Encounter (INDEPENDENT_AMBULATORY_CARE_PROVIDER_SITE_OTHER)

## 2023-11-16 ENCOUNTER — Other Ambulatory Visit: Payer: Self-pay

## 2023-11-16 ENCOUNTER — Other Ambulatory Visit

## 2023-11-16 ENCOUNTER — Ambulatory Visit (INDEPENDENT_AMBULATORY_CARE_PROVIDER_SITE_OTHER): Payer: Self-pay

## 2023-11-16 DIAGNOSIS — E559 Vitamin D deficiency, unspecified: Secondary | ICD-10-CM | POA: Insufficient documentation

## 2023-11-16 DIAGNOSIS — N1832 Chronic kidney disease, stage 3b: Secondary | ICD-10-CM | POA: Insufficient documentation

## 2023-11-16 DIAGNOSIS — I10 Essential (primary) hypertension: Secondary | ICD-10-CM | POA: Insufficient documentation

## 2023-11-16 LAB — CBC WITH DIFF
BASOPHIL #: 0.1 10*3/uL (ref 0.00–0.10)
BASOPHIL %: 1 % (ref 0–1)
EOSINOPHIL #: 0.1 10*3/uL (ref 0.00–0.50)
EOSINOPHIL %: 2 % (ref 1–7)
HCT: 40.2 % (ref 31.2–41.9)
HGB: 12.9 g/dL (ref 10.9–14.3)
LYMPHOCYTE #: 2.8 10*3/uL (ref 1.10–3.10)
LYMPHOCYTE %: 45 % (ref 16–46)
MCH: 28 pg (ref 24.7–32.8)
MCHC: 32.1 g/dL — ABNORMAL LOW (ref 32.3–35.6)
MCV: 87.5 fL (ref 75.5–95.3)
MONOCYTE #: 0.7 10*3/uL (ref 0.20–0.90)
MONOCYTE %: 11 % (ref 4–11)
MPV: 11.2 fL — ABNORMAL HIGH (ref 7.9–10.8)
NEUTROPHIL #: 2.5 10*3/uL (ref 1.90–8.20)
NEUTROPHIL %: 41 % — ABNORMAL LOW (ref 43–77)
PLATELETS: 161 10*3/uL (ref 140–440)
RBC: 4.59 10*6/uL (ref 3.63–4.92)
RDW: 15.9 % (ref 12.3–17.7)
WBC: 6.2 10*3/uL (ref 3.8–11.8)

## 2023-11-16 LAB — BASIC METABOLIC PANEL
ANION GAP: 8 mmol/L (ref 4–13)
BUN/CREA RATIO: 11 (ref 6–22)
BUN: 17 mg/dL (ref 7–25)
CALCIUM: 9 mg/dL (ref 8.6–10.3)
CHLORIDE: 110 mmol/L — ABNORMAL HIGH (ref 98–107)
CO2 TOTAL: 29 mmol/L (ref 21–31)
CREATININE: 1.58 mg/dL — ABNORMAL HIGH (ref 0.60–1.30)
ESTIMATED GFR: 32 mL/min/{1.73_m2} — ABNORMAL LOW (ref >59)
GLUCOSE: 87 mg/dL (ref 74–109)
OSMOLALITY, CALCULATED: 293 mosm/kg — ABNORMAL HIGH (ref 270–290)
POTASSIUM: 4 mmol/L (ref 3.5–5.1)
SODIUM: 147 mmol/L — ABNORMAL HIGH (ref 136–145)

## 2023-11-16 LAB — MAGNESIUM: MAGNESIUM: 1.7 mg/dL — ABNORMAL LOW (ref 1.9–2.7)

## 2023-11-16 LAB — PROTEIN/CREATININE RATIO, URINE, RANDOM
CREATININE RANDOM URINE: 167 mg/dL — ABNORMAL HIGH (ref 30–125)
PROTEIN RANDOM URINE: 58 mg/dL (ref 50–80)
PROTEIN/CREATININE RATIO RANDOM URINE: 0.347 mg/mg (ref 0.000–200.000)

## 2023-11-16 LAB — PARATHYROID HORMONE (PTH): PTH: 115 pg/mL — ABNORMAL HIGH (ref 12.0–88.0)

## 2023-11-16 LAB — MICROALBUMIN URINE, RANDOM: MICROALBUMIN RANDOM URINE: 26.9 mg/dL

## 2023-11-16 LAB — URIC ACID: URIC ACID: 6 mg/dL (ref 2.3–7.6)

## 2023-11-16 LAB — VITAMIN D 25 TOTAL: VITAMIN D 25, TOTAL: 22.03 ng/mL — ABNORMAL LOW (ref 30.00–100.00)

## 2023-11-23 ENCOUNTER — Encounter (INDEPENDENT_AMBULATORY_CARE_PROVIDER_SITE_OTHER): Payer: Self-pay

## 2023-11-24 ENCOUNTER — Other Ambulatory Visit: Payer: Self-pay

## 2023-11-24 ENCOUNTER — Ambulatory Visit (INDEPENDENT_AMBULATORY_CARE_PROVIDER_SITE_OTHER)

## 2023-11-24 ENCOUNTER — Encounter (INDEPENDENT_AMBULATORY_CARE_PROVIDER_SITE_OTHER): Payer: Self-pay

## 2023-11-24 VITALS — BP 158/79 | HR 62 | Ht 63.0 in | Wt 175.0 lb

## 2023-11-24 DIAGNOSIS — I10 Essential (primary) hypertension: Secondary | ICD-10-CM

## 2023-11-24 DIAGNOSIS — E1122 Type 2 diabetes mellitus with diabetic chronic kidney disease: Secondary | ICD-10-CM

## 2023-11-24 DIAGNOSIS — Z7984 Long term (current) use of oral hypoglycemic drugs: Secondary | ICD-10-CM

## 2023-11-24 DIAGNOSIS — N1832 Chronic kidney disease, stage 3b: Secondary | ICD-10-CM

## 2023-11-24 DIAGNOSIS — E559 Vitamin D deficiency, unspecified: Secondary | ICD-10-CM

## 2023-11-24 DIAGNOSIS — I129 Hypertensive chronic kidney disease with stage 1 through stage 4 chronic kidney disease, or unspecified chronic kidney disease: Secondary | ICD-10-CM

## 2023-11-24 MED ORDER — TELMISARTAN 80 MG TABLET
80.0000 mg | ORAL_TABLET | Freq: Every day | ORAL | 3 refills | Status: DC
Start: 2023-11-24 — End: 2024-01-13

## 2023-11-24 MED ORDER — MAGNESIUM OXIDE 400 MG (241.3 MG MAGNESIUM) TABLET
400.0000 mg | ORAL_TABLET | Freq: Every day | ORAL | 0 refills | Status: DC
Start: 2023-11-24 — End: 2024-01-13

## 2023-11-24 MED ORDER — CALCITRIOL 0.25 MCG CAPSULE
0.2500 ug | ORAL_CAPSULE | Freq: Every day | ORAL | 0 refills | Status: AC
Start: 2023-11-24 — End: 2024-02-22

## 2023-11-24 NOTE — Progress Notes (Signed)
 NEPHROLOGY, [facility]  [address] [place] [address]    Progress Note    Name: Connie  [name] MRN:  [id number]   Date: 11/24/2023 DOB:  [birth year] (89 y.o.)              Nephrology Out  Patient Visit       HPI : 89 y.o.  female her for a follow up chronic kidney disease stage IIIb: Creatinine 1.58 GFR 32.  Past medical history chronic kidney disease, non-insulin-dependent diabetes mellitus type II, hypothyroidism,  hypertension, and gout. Blood pressure is slightly elevated today. States blood pressure has been running 150's-160s systolic. No edema today. Reports no problems with edema. Denies nausea and vomiting. Denies hematuria and dysuria.       ROS:     Assessment  was negative except what mentioned in in the HPI.     OBJECTIVE:   BP (!) 158/79   Pulse 62   Ht 1.6 m (5\' 3" )   Wt 79.4 kg (175 lb)   BMI 31.00 kg/m       General:  NAD, AAOx3  HEENT:  EOMI,  no icterus  NECK: No increased JVD.  Normal inspection   HEART:  Regular rhythm. No murmurs or rubs. No chest wall tenderness   LUNGS: Clear to auscultation bilateral. No wheezes, rales, or rhonchi.   ABDOMEN: +BS, Soft, nontender and nondistended. No rebound or guarding present.   EXTREMITIES: No edema. No cyanosis or clubbing.No asterixis    NEURO : Normal speech, EOMI.       LABORATORY DATA:   Lab Results   Component Value Date    BUN 17 11/16/2023    BUN 16 08/11/2023    BUN 22 04/07/2023    CREATININE 1.58 (H) 11/16/2023    CREATININE 1.49 (H) 08/11/2023    CREATININE 1.56 (H) 04/07/2023    BUNCRRATIO 11 11/16/2023    BUNCRRATIO 11 08/11/2023    BUNCRRATIO 14 04/07/2023    GFR 32 (L) 11/16/2023    GFR 34 (L) 08/11/2023    GFR 32 (L) 04/07/2023    SODIUM 147 (H) 11/16/2023    SODIUM 143 08/11/2023    SODIUM 143 04/07/2023    POTASSIUM 4.0 11/16/2023    POTASSIUM 4.3 08/11/2023    POTASSIUM 4.2 04/07/2023    CHLORIDE 110 (H) 11/16/2023    CHLORIDE 106 08/11/2023    CHLORIDE 109 (H) 04/07/2023    CO2 29 11/16/2023    CO2 28  08/11/2023    CO2 29 04/07/2023    ANIONGAP 8 11/16/2023    ANIONGAP 9 08/11/2023    ANIONGAP 5 04/07/2023    CALCIUM 9.0 11/16/2023    CALCIUM 9.2 08/11/2023    CALCIUM 8.9 04/07/2023    PHOSPHORUS 3.8 12/31/2022    PHOSPHORUS 3.6 (L) 07/15/2022    PHOSPHORUS 4.4 02/10/2022    ALBUMIN 3.9 03/16/2023    ALBUMIN 4.0 12/31/2022    ALBUMIN 3.1 (L) 11/26/2022    HGB 12.9 11/16/2023    HGB 12.6 08/11/2023    HGB 12.2 04/07/2023    HCT 40.2 11/16/2023    HCT 38.4 08/11/2023    HCT 37.6 04/07/2023    INTACTPTH 115.0 (H) 11/16/2023    INTACTPTH 143.7 (H) 08/11/2023    INTACTPTH 106.0 (H) 04/07/2023    IRON 64 12/31/2022    IRON 84 07/15/2022    IRON 81 02/10/2022    IRONBINDCAP 314 12/31/2022    IRONBINDCAP 283 07/15/2022  IRONBINDCAP 336 02/10/2022    IRONSAT 20 12/31/2022    IRONSAT 30 07/15/2022    IRONSAT 24 02/10/2022    FERRITIN 69 02/10/2022           MEDICATIONS:  Outpatient Medications Marked as Taking for the 11/24/23 encounter (Office Visit) with [facility], NP   Medication Sig    aspirin  (ECOTRIN) 81 mg Oral Tablet, Delayed Release (E.C.) Take 1 Tablet (81 mg total) by mouth Daily    calcitrioL  (ROCALTROL ) 0.25 mcg Oral Capsule Take 1 Capsule (0.25 mcg total) by mouth Daily    Colchicine-Probenecid 500-0.5 mg Oral Tablet Take 1 Tablet by mouth Every evening    cyanocobalamin  (VITAMIN B 12) 1,000 mcg Oral Tablet Take 1 Tablet (1,000 mcg total) by mouth Daily    empagliflozin  (JARDIANCE ) 10 mg Oral Tablet Take 1 Tablet (10 mg total) by mouth Once a day for 90 days    ergocalciferol , vitamin D2, (DRISDOL) 1,250 mcg (50,000 unit) Oral Capsule Take 1 Capsule (50,000 Units total) by mouth Every 7 days    ezetimibe  (ZETIA ) 10 mg Oral Tablet Take 1 Tablet (10 mg total) by mouth Every morning for 180 days    glimepiride (AMARYL) 1 mg Oral Tablet Take 1 Tablet (1 mg total) by mouth Every morning with breakfast    glimepiride (AMARYL) 2 mg Oral Tablet Take 1 Tablet (2 mg total) by mouth Every morning with breakfast     hydroCHLOROthiazide (HYDRODIURIL) 12.5 mg Oral Tablet Take 1 Tablet (12.5 mg total) by mouth Once a day    levothyroxine (SYNTHROID) 75 mcg Oral Tablet Take 1 Tablet (75 mcg total) by mouth Every morning    pravastatin (PRAVACHOL) 10 mg Oral Tablet Take 1 Tablet (10 mg total) by mouth Every morning    SITagliptin phosphate (JANUVIA) 50 mg Oral Tablet Take 1 Tablet (50 mg total) by mouth Daily    [DISCONTINUED] olmesartan  (BENICAR ) 40 mg Oral Tablet Take 1 Tablet (40 mg total) by mouth Once a day for 360 days Indications: chronic kidney disease with albuminuria     Current Outpatient Medications   Medication Instructions    amLODIPine  (NORVASC) 10 mg, Oral, Daily    aspirin  (ECOTRIN) 81 mg, Daily    calcitrioL  (ROCALTROL ) 0.25 mcg, Daily    Colchicine-Probenecid 500-0.5 mg Oral Tablet 1 Tablet, EVERY EVENING    cyanocobalamin  (VITAMIN B 12) 1,000 mcg, Daily    empagliflozin  (JARDIANCE ) 10 mg, Oral, DAILY    ergocalciferol  (vitamin D2) (DRISDOL) 50,000 Units, EVERY 7 DAYS    ezetimibe  (ZETIA ) 10 mg, Oral, EVERY MORNING    glimepiride (AMARYL) 1 mg, EVERY MORNING WITH BREAKFAST    glimepiride (AMARYL) 2 mg, EVERY MORNING WITH BREAKFAST    hydroCHLOROthiazide (HYDRODIURIL) 12.5 mg, DAILY    levothyroxine (SYNTHROID) 75 mcg, EVERY MORNING    pravastatin (PRAVACHOL) 10 mg, EVERY MORNING    SITagliptin phosphate (JANUVIA) 50 mg, Daily      ASSESSMENT / PLAN:  ENCOUNTER DIAGNOSES     ICD-10-CM   1. Stage 3b chronic kidney disease (CMS HCC)  N18.32   2. Hypertension, unspecified type  I10   3. Vitamin D  deficiency  E55.9           Chronic kidney disease  -Stage IIIb  -Due to diabetes and hypertension  -Creatinine is 1.58 GFR 32   -Baseline creatinine : 1.6   -Total protein to creatinine ratio- 0.35  Lab Results   Component Value Date    UPCR 0.347 11/16/2023  UPCR 0.673 08/11/2023      -CBC and a basic metabolic panel  -Return to clinic in 3 months  -Continue low-sodium diet  -balanced Fluid intake 45 oz a day.     -Avoid NSAIDs.  Tylenol  only for pain  -Renal imaging: No hydronephrosis both kidneys are small in size  -olmesartan  40 mg daily  -Sodium Glucose Cotransporter-2 (SGLT2) Inhibitors:   -Jardiance  10 mg daily           CKD bone mineral disease  Lab Results   Component Value Date    INTACTPTH 115.0 (H) 11/16/2023    [id number] 22.03 (L) 11/16/2023    PHOSPHORUS 3.8 12/31/2022    -calcitriol  0 .25 mcg daily three months  -monitor vitamin D  level  -PTH             Hypertension  -Blood pressure is acceptable  -Goal less than 130/80  -telmisartan  80 mg daily  -amlodipine  10 mg daily  -hydrochlorothiazide 12.5 mg daily   -Low-salt diet        Diabetes type 2  -A1c goal is less than 7   Lab Results   Component Value Date    HA1C 6.8 (H) 09/14/2023      -Continue Jardiance  10 mg daily   -glimepiride 1 mg daily   -Diabetic diet  -Increase activity and exercise as possible  -Monitor A1C      Hypomagnesemia  -magnesium  400 mg daily  -monitor magnesium  level        I have discussed with the patient the following issues:  The main goal of treatment is to prevent progression of CKD to complete kidney failure. The best way to do this is to control the underlying cause.  the optimal diet is one similar to the Dietary Approaches to Stop Hypertension (DASH) diet, consisting of fruits, vegetables, legumes, fish, poultry, and whole grains.  Restrict sodium intake to less than 2 g/day        Orders Placed This Encounter    BASIC METABOLIC PANEL    CBC/DIFF    MAGNESIUM     VITAMIN D  25 TOTAL    URIC ACID    PROTEIN/CREATININE RATIO, URINE, RANDOM    PARATHYROID  HORMONE (PTH)    MICROALBUMIN/CREATININE RATIO, URINE, RANDOM         [facility], NP

## 2023-11-24 NOTE — Nursing Note (Signed)
 Follow up labs

## 2023-11-27 ENCOUNTER — Other Ambulatory Visit: Payer: Self-pay

## 2023-11-27 ENCOUNTER — Encounter (HOSPITAL_BASED_OUTPATIENT_CLINIC_OR_DEPARTMENT_OTHER): Payer: Self-pay

## 2023-11-27 ENCOUNTER — Emergency Department (HOSPITAL_BASED_OUTPATIENT_CLINIC_OR_DEPARTMENT_OTHER)

## 2023-11-27 ENCOUNTER — Emergency Department
Admission: EM | Admit: 2023-11-27 | Discharge: 2023-11-27 | Disposition: A | Discharge status: Home / Self Care | Attending: NURSE PRACTITIONER | Admitting: NURSE PRACTITIONER

## 2023-11-27 DIAGNOSIS — R9431 Abnormal electrocardiogram [ECG] [EKG]: Secondary | ICD-10-CM | POA: Insufficient documentation

## 2023-11-27 DIAGNOSIS — Z1152 Encounter for screening for COVID-19: Secondary | ICD-10-CM | POA: Insufficient documentation

## 2023-11-27 DIAGNOSIS — E860 Dehydration: Secondary | ICD-10-CM | POA: Insufficient documentation

## 2023-11-27 DIAGNOSIS — R42 Dizziness and giddiness: Secondary | ICD-10-CM

## 2023-11-27 DIAGNOSIS — G43909 Migraine, unspecified, not intractable, without status migrainosus: Secondary | ICD-10-CM | POA: Insufficient documentation

## 2023-11-27 DIAGNOSIS — R55 Syncope and collapse: Secondary | ICD-10-CM

## 2023-11-27 DIAGNOSIS — R001 Bradycardia, unspecified: Secondary | ICD-10-CM | POA: Insufficient documentation

## 2023-11-27 DIAGNOSIS — N179 Acute kidney failure, unspecified: Secondary | ICD-10-CM

## 2023-11-27 LAB — CBC WITH DIFF
BASOPHIL #: 0.01 10*3/uL (ref 0.00–0.10)
BASOPHIL %: 0 % (ref 0–1)
EOSINOPHIL #: 0.07 10*3/uL (ref 0.00–0.50)
EOSINOPHIL %: 1 % (ref 1–7)
HCT: 41.4 % (ref 31.2–41.9)
HGB: 13.1 g/dL (ref 10.9–14.3)
LYMPHOCYTE #: 1.17 10*3/uL (ref 1.10–3.10)
LYMPHOCYTE %: 22 % (ref 16–46)
MCH: 28.6 pg (ref 24.7–32.8)
MCHC: 31.5 g/dL — ABNORMAL LOW (ref 32.3–35.6)
MCV: 90.5 fL (ref 75.5–95.3)
MONOCYTE #: 0.61 10*3/uL (ref 0.20–0.90)
MONOCYTE %: 12 % — ABNORMAL HIGH (ref 4–11)
MPV: 10.8 fL (ref 7.9–10.8)
NEUTROPHIL #: 3.35 10*3/uL (ref 1.90–8.20)
NEUTROPHIL %: 64 % (ref 43–77)
PLATELETS: 151 10*3/uL (ref 140–440)
RBC: 4.57 10*6/uL (ref 3.63–4.92)
RDW: 15.6 % (ref 12.3–17.7)
WBC: 5.2 10*3/uL (ref 3.8–11.8)

## 2023-11-27 LAB — COMPREHENSIVE METABOLIC PANEL, NON-FASTING
ALBUMIN/GLOBULIN RATIO: 0.8 (ref 0.8–1.4)
ALBUMIN: 3.3 g/dL — ABNORMAL LOW (ref 3.4–5.0)
ALKALINE PHOSPHATASE: 114 U/L (ref 46–116)
ALT (SGPT): 40 U/L (ref <=78)
ANION GAP: 10 mmol/L (ref 4–13)
AST (SGOT): 36 U/L (ref 15–37)
BILIRUBIN TOTAL: 0.5 mg/dL (ref 0.2–1.0)
BUN/CREA RATIO: 11
BUN: 20 mg/dL — ABNORMAL HIGH (ref 7–18)
CALCIUM, CORRECTED: 9.6 mg/dL
CALCIUM: 9 mg/dL (ref 8.5–10.1)
CHLORIDE: 108 mmol/L — ABNORMAL HIGH (ref 98–107)
CO2 TOTAL: 29 mmol/L (ref 21–32)
CREATININE: 1.75 mg/dL — ABNORMAL HIGH (ref 0.55–1.02)
ESTIMATED GFR: 28 mL/min/{1.73_m2} — ABNORMAL LOW (ref >59)
GLOBULIN: 4.1
GLUCOSE: 68 mg/dL — ABNORMAL LOW (ref 74–106)
OSMOLALITY, CALCULATED: 293 mosm/kg — ABNORMAL HIGH (ref 270–290)
POTASSIUM: 4.4 mmol/L (ref 3.5–5.1)
PROTEIN TOTAL: 7.4 g/dL (ref 6.4–8.2)
SODIUM: 147 mmol/L — ABNORMAL HIGH (ref 136–145)

## 2023-11-27 LAB — URINALYSIS, MACRO/MICRO
BILIRUBIN: NEGATIVE mg/dL
GLUCOSE: 250 mg/dL — AB
KETONES: NEGATIVE mg/dL
NITRITE: NEGATIVE
PH: 6 (ref 4.6–8.0)
SPECIFIC GRAVITY: <=1.005 (ref 1.003–1.035)
UROBILINOGEN: 0.2 mg/dL (ref 0.2–1.0)

## 2023-11-27 LAB — URINALYSIS, MICROSCOPIC: WBCS: >50 /HPF — AB

## 2023-11-27 LAB — TROPONIN-I
TROPONIN I: 17 ng/L — ABNORMAL HIGH (ref <15)
TROPONIN I: 17 ng/L — ABNORMAL HIGH (ref <15)
TROPONIN I: 17 ng/L — ABNORMAL HIGH (ref <15)

## 2023-11-27 LAB — THYROID STIMULATING HORMONE (SENSITIVE TSH): TSH: 1.61 u[IU]/mL (ref 0.358–3.740)

## 2023-11-27 LAB — NT-PROBNP: NT-PROBNP: 1181 pg/mL — ABNORMAL HIGH (ref <=450)

## 2023-11-27 LAB — COVID-19, FLU A/B, RSV RAPID BY PCR
INFLUENZA VIRUS TYPE A: NOT DETECTED
INFLUENZA VIRUS TYPE B: NOT DETECTED
RESPIRATORY SYNCTIAL VIRUS (RSV): NOT DETECTED
SARS-CoV-2: NOT DETECTED

## 2023-11-27 LAB — BLUE TOP TUBE

## 2023-11-27 LAB — MAGNESIUM: MAGNESIUM: 1.7 mg/dL — ABNORMAL LOW (ref 1.8–2.4)

## 2023-11-27 MED ORDER — SODIUM CHLORIDE 0.9 % INTRAVENOUS PIGGYBACK
2.0000 g | INTRAVENOUS | Status: AC
Start: 2023-11-27 — End: 2023-11-27
  Administered 2023-11-27: 2 g via INTRAVENOUS
  Administered 2023-11-27: 0 g via INTRAVENOUS

## 2023-11-27 MED ORDER — MECLIZINE 25 MG TABLET
25.0000 mg | ORAL_TABLET | ORAL | Status: AC
Start: 2023-11-27 — End: 2023-11-27
  Administered 2023-11-27: 25 mg via ORAL

## 2023-11-27 MED ORDER — SODIUM CHLORIDE 0.9 % IV BOLUS
1000.0000 mL | INJECTION | Status: AC
Start: 2023-11-27 — End: 2023-11-27
  Administered 2023-11-27: 1000 mL via INTRAVENOUS
  Administered 2023-11-27: 0 mL via INTRAVENOUS

## 2023-11-27 MED ORDER — FUROSEMIDE 10 MG/ML INJECTION SOLUTION
INTRAMUSCULAR | Status: AC
Start: 2023-11-27 — End: 2023-11-27
  Filled 2023-11-27: qty 4

## 2023-11-27 MED ORDER — MECLIZINE 25 MG TABLET
ORAL_TABLET | ORAL | Status: AC
Start: 2023-11-27 — End: 2023-11-27
  Filled 2023-11-27: qty 1

## 2023-11-27 MED ORDER — FUROSEMIDE 10 MG/ML INJECTION SOLUTION
40.0000 mg | INTRAMUSCULAR | Status: AC
Start: 2023-11-27 — End: 2023-11-27
  Administered 2023-11-27: 40 mg via INTRAVENOUS

## 2023-11-27 MED ORDER — ACETAMINOPHEN 325 MG TABLET
650.0000 mg | ORAL_TABLET | ORAL | Status: AC
Start: 2023-11-27 — End: 2023-11-27
  Administered 2023-11-27: 650 mg via ORAL

## 2023-11-27 MED ORDER — CEFTRIAXONE 2 GRAM SOLUTION FOR INJECTION
INTRAMUSCULAR | Status: AC
Start: 2023-11-27 — End: 2023-11-27
  Filled 2023-11-27: qty 20

## 2023-11-27 MED ORDER — ACETAMINOPHEN 325 MG TABLET
ORAL_TABLET | ORAL | Status: AC
Start: 2023-11-27 — End: 2023-11-27
  Filled 2023-11-27: qty 2

## 2023-11-27 NOTE — ED Nurses Note (Signed)
 Provided pt with meal tray and po fluids. Plan of care ongoing.

## 2023-11-27 NOTE — Discharge Instructions (Signed)

## 2023-11-27 NOTE — ED Nurses Note (Signed)
 Pt reported dizziness continues with standing.

## 2023-11-27 NOTE — ED Nurses Note (Signed)
 Pt stated occipital HA remains. Reported slight improvement with dizziness after receiving antivert . Provided pt with po fluids.

## 2023-11-27 NOTE — ED Nurses Note (Signed)
 Patient discharged home with family. Reviewed instructions with patient and daughter. Questions sufficiently answered as needed. Patient and daughter verbalized understanding of instructions. Patient encouraged to follow up with PCP as indicated. In the event of an emergency, patient and daughter instructed to call 911 or go to the nearest emergency room. Patient off the unit.

## 2023-11-27 NOTE — ED Provider Notes (Signed)
 [facility]  ED Primary Provider Note  Patient Name: Connie Barber  Patient Age: 89 y.o.  Date of Birth: 1934-08-09    Chief Complaint: Syncope (Patient states that when she got up she could not see and that she about fell out.)        History of Present Illness       Connie Barber is a 89 y.o. female who had concerns including Syncope.  Patient presents to ED today for a near syncopal episode.  Patient states she went to stand up became very dizzy and could not see.  Patient states she feels of his if she is spinning.  Patient does report a headache.  Patient denies any fall or trauma or injury.  Patient states her headaches that has similar.  Patient denies any chest pain or shortness of breath.        Review of Systems     No other overt Review of Systems are noted to be positive except noted in the HPI.      Historical Data   History Reviewed This Encounter: Medical History  Surgical History  Family History  Social History      Physical Exam   ED Triage Vitals [11/27/23 1028]   BP (Non-Invasive) (!) 151/70   Heart Rate 50   Respiratory Rate 18   Temperature 36.4 C (97.6 F)   SpO2 95 %   Weight 77.6 kg (171 lb)   Height 1.6 m (5\' 3" )         Nursing notes reviewed for what could be assessed. Past Medical, Surgical, and Social history reviewed for what has been completed.     Constitutional: NAD. Well-Developed. Well Nourished.  Head: Normocephalic, atraumatic.  Mouth/Throat:  Moist mucous membranes.  Eyes: EOM grossly intact, conjunctiva normal.  Neck: Supple  Cardiovascular: Regular Rate and Rhythm, extremities well perfused.  Pulmonary/Chest: No respiratory distress. Lungs are symmetric to auscultation bilaterally.    Abdominal: Soft, non-tender, non-distended. Non peritoneal, no rebound, no guarding.  MSK:  2+ bilateral lower extremity pitting edema up to the knees.  Skin: Warm, dry, and intact  Neuro: Appropriate, CN II-XII grossly intact. Gait at patient's  baseline  Psych: Pleasant             Procedures      Patient Data     Labs Ordered/Reviewed   COMPREHENSIVE METABOLIC PANEL, NON-FASTING - Abnormal; Notable for the following components:       Result Value    SODIUM 147 (*)     CHLORIDE 108 (*)     BUN 20 (*)     CREATININE 1.75 (*)     ESTIMATED GFR 28 (*)     ALBUMIN 3.3 (*)     GLUCOSE 68 (*)     OSMOLALITY, CALCULATED 293 (*)     All other components within normal limits    Narrative:     Estimated Glomerular Filtration Rate (eGFR) is calculated using the CKD-EPI (2021) equation, intended for patients 87 years of age and older. If gender is not documented or "unknown", there will be no eGFR calculation.   MAGNESIUM  - Abnormal; Notable for the following components:    MAGNESIUM  1.7 (*)     All other components within normal limits   TROPONIN-I - Abnormal; Notable for the following components:    TROPONIN I 17 (*)     All other components within normal limits    Narrative:  Values received on females ranging between 12-15 ng/L MUST include the next serial troponin to review changes in the delta differences as the reference range for the Access II chemistry analyzer is lower than the established reference range.     TROPONIN-I - Abnormal; Notable for the following components:    TROPONIN I 17 (*)     All other components within normal limits    Narrative:     Values received on females ranging between 12-15 ng/L MUST include the next serial troponin to review changes in the delta differences as the reference range for the Access II chemistry analyzer is lower than the established reference range.     TROPONIN-I - Abnormal; Notable for the following components:    TROPONIN I 17 (*)     All other components within normal limits    Narrative:     Values received on females ranging between 12-15 ng/L MUST include the next serial troponin to review changes in the delta differences as the reference range for the Access II chemistry analyzer is lower than the  established reference range.     CBC WITH DIFF - Abnormal; Notable for the following components:    MCHC 31.5 (*)     MONOCYTE % 12 (*)     All other components within normal limits   URINALYSIS, MACRO/MICRO - Abnormal; Notable for the following components:    APPEARANCE Slightly Hazy (*)     LEUKOCYTES Small (*)     PROTEIN Trace (*)     GLUCOSE 250 (*)     BLOOD Small (*)     All other components within normal limits   NT-PROBNP - Abnormal; Notable for the following components:    NT-PROBNP 1,181 (*)     All other components within normal limits   URINALYSIS, MICROSCOPIC - Abnormal; Notable for the following components:    RBCS 6-10 (*)     BACTERIA Few (*)     WBCS >50 (*)     All other components within normal limits   THYROID  STIMULATING HORMONE (SENSITIVE TSH) - Normal   COVID-19, FLU A/B, RSV RAPID BY PCR - Normal    Narrative:     Results are for the simultaneous qualitative identification of SARS-CoV-2 (formerly 2019-nCoV), Influenza A, Influenza B, and RSV RNA. These etiologic agents are generally detectable in nasopharyngeal and nasal swabs during the ACUTE PHASE of infection. Hence, this test is intended to be performed on respiratory specimens collected from individuals with signs and symptoms of upper respiratory tract infection who meet Centers for Disease Control and Prevention (CDC) clinical and/or epidemiological criteria for Coronavirus Disease 2019 (COVID-19) testing. CDC COVID-19 criteria for testing on human specimens is available at [facility] webpage information for Healthcare Professionals: Coronavirus Disease 2019 (COVID-19) ([employer]).     False-negative results may occur if the virus has genomic mutations, insertions, deletions, or rearrangements or if performed very early in the course of illness. Otherwise, negative results indicate virus specific RNA targets are not detected, however negative results do not preclude SARS-CoV-2  infection/COVID-19, Influenza, or Respiratory syncytial virus infection. Results should not be used as the sole basis for patient management decisions. Negative results must be combined with clinical observations, patient history, and epidemiological information. If upper respiratory tract infection is still suspected based on exposure history together with other clinical findings, re-testing should be considered.    Test methodology:   Cepheid Xpert Xpress SARS-CoV-2/Flu/RSV Assay real-time polymerase chain reaction (RT-PCR) test on the GeneXpert Dx  and Xpert Xpress systems.   URINE CULTURE,ROUTINE   CBC/DIFF    Narrative:     The following orders were created for panel order CBC/DIFF.  Procedure                               Abnormality         Status                     ---------                               -----------         ------                     CBC WITH DIFF[[id number]]                Abnormal            Final result                 Please view results for these tests on the individual orders.   URINALYSIS WITH REFLEX MICROSCOPIC AND CULTURE IF POSITIVE    Narrative:     The following orders were created for panel order URINALYSIS WITH REFLEX MICROSCOPIC AND CULTURE IF POSITIVE.  Procedure                               Abnormality         Status                     ---------                               -----------         ------                     URINALYSIS, MACRO/MICRO[[id number]]      Abnormal            Final result               URINALYSIS, MICROSCOPIC[[id number]]      Abnormal            Final result                 Please view results for these tests on the individual orders.   BLUE TOP TUBE       CT BRAIN WO IV CONTRAST   Final Result by Edi, Radresults In (05/02 1520)   CHRONIC CHANGES.  NO ACUTE FINDINGS.          Radiologist location ID: [id number]             Medical Decision Making        Medical Decision Making  Amount and/or Complexity of Data Reviewed  Labs: ordered.  Radiology:  ordered. Decision-making details documented in ED Course.  ECG/medicine tests: ordered and independent interpretation performed.    Risk  OTC drugs.  Prescription drug management.          Studies Assessed:  Labs, imaging, EKG    MDM Narrative:  Patient presents to ED today for a near syncopal episode.  Patient states she went to stand up became very  dizzy and could not see.  Patient states she feels of his if she is spinning.  Patient does report a headache.  Patient denies any fall or trauma or injury.  Patient states her headaches that has similar.  Patient denies any chest pain or shortness of breath.  Patient's AKI was treated.  Patient was given a dose of Rocephin for the urinary tract infection.  CT brain was within normal limits.  Again patient states that she feels significantly better and her headache has subsided with the Tylenol  and the other medication she received.  Patient states she is eager to get home.  No other neurological symptoms were present.  Patient is discharged.      ED Course as of 11/27/23 2137   Fri Nov 27, 2023   1152 TROPONIN-I(!): 17   1152 CREATININE(!): 1.75   1153 BUN(!): 20   1153 CHLORIDE(!): 108   1153 SODIUM(!): 147   1208 WBC'S(!): >50   1314 TROPONIN-I(!): 17   1448 Upon reassessment patient states she feels significantly better.  Patient is up eating a sandwich.   1512 TROPONIN-I(!): 17  Patient's troponins has been flat.   1527 CT BRAIN WO IV CONTRAST  IMPRESSION:  CHRONIC CHANGES.  NO ACUTE FINDINGS.            Medications Ordered/Administered in the ED   meclizine (ANTIVERT) tablet (25 mg Oral Given 11/27/23 1147)   cefTRIAXone (ROCEPHIN) 2 g in NS 50 mL IVPB minibag (0 g Intravenous Stopped 11/27/23 1304)   furosemide  (LASIX ) 10 mg/mL injection (40 mg Intravenous Given 11/27/23 1234)   NS bolus infusion 1,000 mL (0 mL Intravenous Stopped 11/27/23 1334)   acetaminophen  (TYLENOL ) tablet (650 mg Oral Given 11/27/23 1352)       Following the history, physical exam, and ED workup, the  patient was deemed stable and suitable for discharge. The patient/caregiver was advised to return to the ED for any new or worsening symptoms. Discharge medications, and follow-up instructions were discussed with the patient/caregiver in detail, who verbalizes understanding. The patient/caregiver is in agreement and is comfortable with the plan of care.    Disposition: Discharged         Current Discharge Medication List        CONTINUE these medications - NO CHANGES were made during your visit.        Details   amLODIPine  10 mg Tablet  Commonly known as: NORVASC   10 mg, Oral, Daily  Qty: 90 Tablet  Refills: 1     aspirin  81 mg Tablet, Delayed Release (E.C.)  Commonly known as: ECOTRIN   81 mg, Daily  Refills: 0     calcitrioL  0.25 mcg Capsule  Commonly known as: ROCALTROL    0.25 mcg, Oral, Daily  Qty: 90 Capsule  Refills: 0     Colchicine-Probenecid 500-0.5 mg Tablet   1 Tablet, EVERY EVENING  Refills: 0     cyanocobalamin  1,000 mcg Tablet  Commonly known as: VITAMIN B 12   1,000 mcg, Daily  Refills: 0     ezetimibe  10 mg Tablet  Commonly known as: ZETIA    10 mg, Oral, EVERY MORNING  Qty: 90 Tablet  Refills: 1     * glimepiride 1 mg Tablet  Commonly known as: AMARYL   1 mg, EVERY MORNING WITH BREAKFAST  Refills: 0     * glimepiride 2 mg Tablet  Commonly known as: AMARYL   2 mg, EVERY MORNING WITH BREAKFAST  Refills: 0  hydroCHLOROthiazide 12.5 mg Tablet  Commonly known as: HYDRODIURIL   12.5 mg, DAILY  Refills: 0     levothyroxine 75 mcg Tablet  Commonly known as: SYNTHROID   75 mcg, EVERY MORNING  Refills: 0     magnesium  oxide 400 mg Tablet  Commonly known as: MAG-OX   400 mg, Oral, Daily  Qty: 90 Tablet  Refills: 0     pravastatin 10 mg Tablet  Commonly known as: PRAVACHOL   10 mg, EVERY MORNING  Refills: 0     SITagliptin phosphate 50 mg Tablet  Commonly known as: JANUVIA   50 mg, Daily  Refills: 0     telmisartan  80 mg Tablet  Commonly known as: MICARDIS    80 mg, Oral, Daily  Qty: 90 Tablet  Refills: 3            * This list has 2 medication(s) that are the same as other medications prescribed for you. Read the directions carefully, and ask your doctor or other care provider to review them with you.                ASK your doctor about these medications.        Details   empagliflozin  10 mg Tablet  Commonly known as: JARDIANCE   Ask about: Should I take this medication?   10 mg, Oral, DAILY  Qty: 90 Tablet  Refills: 3            Follow up:   Bhasin, Sunita, MD  [address]  [address]  [facility] [address]  [phone]                     Clinical Impression   Vertigo (Primary)   Migraine   Dehydration   AKI (acute kidney injury) (CMS [facility])         Discharge Medication List as of 11/27/2023  3:29 PM              P. Ike Malady FNP-C  Department of Emergency Medicine

## 2023-11-28 DIAGNOSIS — R001 Bradycardia, unspecified: Secondary | ICD-10-CM

## 2023-11-28 DIAGNOSIS — R9431 Abnormal electrocardiogram [ECG] [EKG]: Secondary | ICD-10-CM

## 2023-11-28 LAB — ECG 12 LEAD
Atrial Rate: 52 {beats}/min
Calculated P Axis: 50 degrees
Calculated R Axis: -23 degrees
Calculated T Axis: -64 degrees
PR Interval: 178 ms
QRS Duration: 100 ms
QT Interval: 436 ms
QTC Calculation: 405 ms
Ventricular rate: 52 {beats}/min

## 2023-11-29 ENCOUNTER — Ambulatory Visit (HOSPITAL_BASED_OUTPATIENT_CLINIC_OR_DEPARTMENT_OTHER): Payer: Self-pay | Admitting: Family

## 2023-11-29 LAB — URINE CULTURE,ROUTINE: URINE CULTURE: NO GROWTH — AB

## 2023-12-04 ENCOUNTER — Emergency Department
Admission: EM | Admit: 2023-12-04 | Discharge: 2023-12-05 | Disposition: A | Discharge status: Home / Self Care | Attending: Emergency Medicine | Admitting: Emergency Medicine

## 2023-12-04 ENCOUNTER — Emergency Department (HOSPITAL_COMMUNITY)

## 2023-12-04 ENCOUNTER — Other Ambulatory Visit: Payer: Self-pay

## 2023-12-04 ENCOUNTER — Ambulatory Visit (HOSPITAL_COMMUNITY)

## 2023-12-04 ENCOUNTER — Encounter (HOSPITAL_COMMUNITY): Payer: Self-pay

## 2023-12-04 DIAGNOSIS — I517 Cardiomegaly: Secondary | ICD-10-CM | POA: Insufficient documentation

## 2023-12-04 DIAGNOSIS — E162 Hypoglycemia, unspecified: Secondary | ICD-10-CM | POA: Insufficient documentation

## 2023-12-04 DIAGNOSIS — R42 Dizziness and giddiness: Secondary | ICD-10-CM | POA: Insufficient documentation

## 2023-12-04 DIAGNOSIS — R9431 Abnormal electrocardiogram [ECG] [EKG]: Secondary | ICD-10-CM | POA: Insufficient documentation

## 2023-12-04 LAB — COMPREHENSIVE METABOLIC PANEL, NON-FASTING
ALBUMIN/GLOBULIN RATIO: 1.3 (ref 0.8–1.4)
ALBUMIN: 3.8 g/dL (ref 3.5–5.7)
ALKALINE PHOSPHATASE: 109 U/L — ABNORMAL HIGH (ref 34–104)
ALT (SGPT): 35 U/L (ref 7–52)
ANION GAP: 9 mmol/L (ref 4–13)
AST (SGOT): 47 U/L — ABNORMAL HIGH (ref 13–39)
BILIRUBIN TOTAL: 0.4 mg/dL (ref 0.3–1.0)
BUN/CREA RATIO: 15 (ref 6–22)
BUN: 26 mg/dL — ABNORMAL HIGH (ref 7–25)
CALCIUM, CORRECTED: 9.4 mg/dL (ref 8.9–10.8)
CALCIUM: 9.2 mg/dL (ref 8.6–10.3)
CHLORIDE: 109 mmol/L — ABNORMAL HIGH (ref 98–107)
CO2 TOTAL: 27 mmol/L (ref 21–31)
CREATININE: 1.69 mg/dL — ABNORMAL HIGH (ref 0.60–1.30)
ESTIMATED GFR: 29 mL/min/{1.73_m2} — ABNORMAL LOW (ref >59)
GLOBULIN: 3 (ref 2.0–3.5)
GLUCOSE: 64 mg/dL — ABNORMAL LOW (ref 74–109)
OSMOLALITY, CALCULATED: 292 mosm/kg — ABNORMAL HIGH (ref 270–290)
POTASSIUM: 3.9 mmol/L (ref 3.5–5.1)
PROTEIN TOTAL: 6.8 g/dL (ref 6.4–8.9)
SODIUM: 145 mmol/L (ref 136–145)

## 2023-12-04 LAB — URINALYSIS, MACROSCOPIC
BILIRUBIN: NEGATIVE mg/dL
BLOOD: 0.03 mg/dL
GLUCOSE: NEGATIVE mg/dL
KETONES: NEGATIVE mg/dL
LEUKOCYTES: NEGATIVE WBCs/uL
NITRITE: NEGATIVE
PH: 5 (ref 5.0–9.0)
PROTEIN: NEGATIVE mg/dL
SPECIFIC GRAVITY: 1.006 (ref 1.002–1.030)
UROBILINOGEN: NORMAL mg/dL

## 2023-12-04 LAB — CBC WITH DIFF
BASOPHIL #: 0 10*3/uL (ref 0.00–0.10)
BASOPHIL %: 1 % (ref 0–1)
EOSINOPHIL #: 0 10*3/uL (ref 0.00–0.50)
EOSINOPHIL %: 1 % (ref 1–7)
HCT: 38.6 % (ref 31.2–41.9)
HGB: 12.8 g/dL (ref 10.9–14.3)
LYMPHOCYTE #: 1.4 10*3/uL (ref 1.10–3.10)
LYMPHOCYTE %: 27 % (ref 16–46)
MCH: 28.5 pg (ref 24.7–32.8)
MCHC: 33.1 g/dL (ref 32.3–35.6)
MCV: 86.1 fL (ref 75.5–95.3)
MONOCYTE #: 0.7 10*3/uL (ref 0.20–0.90)
MONOCYTE %: 13 % — ABNORMAL HIGH (ref 4–11)
MPV: 10.9 fL — ABNORMAL HIGH (ref 7.9–10.8)
NEUTROPHIL #: 3 10*3/uL (ref 1.90–8.20)
NEUTROPHIL %: 58 % (ref 43–77)
PLATELETS: 157 10*3/uL (ref 140–440)
RBC: 4.49 10*6/uL (ref 3.63–4.92)
RDW: 16 % (ref 12.3–17.7)
WBC: 5.1 10*3/uL (ref 3.8–11.8)

## 2023-12-04 LAB — URINALYSIS, MICROSCOPIC
RBCS: 1 /HPF (ref <4)
SQUAMOUS EPITHELIAL: 6 /HPF (ref <28)
WBCS: 2 /HPF (ref <6)

## 2023-12-04 LAB — BLUE TOP TUBE

## 2023-12-04 LAB — GRAY TOP TUBE

## 2023-12-04 LAB — POC BLOOD GLUCOSE (RESULTS)
GLUCOSE, POC: 71 mg/dL (ref 70–100)
GLUCOSE, POC: 24 mg/dL — CL (ref 70–100)

## 2023-12-04 LAB — THYROID STIMULATING HORMONE (SENSITIVE TSH): TSH: 1.602 u[IU]/mL (ref 0.450–5.330)

## 2023-12-04 LAB — B-TYPE NATRIURETIC PEPTIDE (BNP),PLASMA: BNP: 593 pg/mL — ABNORMAL HIGH (ref 1–100)

## 2023-12-04 MED ORDER — DIAZEPAM 2 MG TABLET
ORAL_TABLET | ORAL | Status: AC
Start: 2023-12-04 — End: 2023-12-04
  Filled 2023-12-04: qty 1

## 2023-12-04 MED ORDER — DEXTROSE 50 % IN WATER (D50W) INTRAVENOUS SYRINGE
INJECTION | INTRAVENOUS | Status: AC
Start: 2023-12-04 — End: 2023-12-04
  Filled 2023-12-04: qty 100

## 2023-12-04 MED ORDER — DIAZEPAM 2 MG TABLET
2.0000 mg | ORAL_TABLET | ORAL | Status: AC
Start: 2023-12-04 — End: 2023-12-04
  Administered 2023-12-04: 2 mg via ORAL

## 2023-12-04 MED ORDER — DEXTROSE 50 % IN WATER (D50W) INTRAVENOUS SYRINGE
25.0000 g | INJECTION | INTRAVENOUS | Status: AC
Start: 2023-12-04 — End: 2023-12-04
  Administered 2023-12-04: 50 mL via INTRAVENOUS

## 2023-12-04 NOTE — ED Nurses Note (Signed)
 Pt reports "some shortness of breath" with right mid back pain

## 2023-12-04 NOTE — ED Provider Notes (Signed)
 [facility] - Emergency Department  ED Primary Provider Note  History of Present Illness   Chief Complaint   Patient presents with    Dizziness    Flank Pain    Shortness of Breath    Low Blood Sugar     Arrival: The patient arrived by Car  Connie  Barber Stable Lebreton is a 89 y.o. female who had concerns including Dizziness, Flank Pain, Shortness of Breath, and Low Blood Sugar. Pt states she is having some shortness of breath and dizziness.  States she also had low blood sugar.  She was seen at [facility] 1 week ago and was given some medications. These have not helped.    {Be sure to document pertinent ROS as appropriate. As of Jul 28, 2021 you are only required to have a "medically appropriate" ROS and PE for billing purposes. This help text will disappear when signing your note.:38154}  History Reviewed This Encounter: ***History has not been "Marked as Reviewed" - Go to the top of the ED Triage Tab to document your review. Make sure history sections are updated and accurate - Click Here to jump to History for update.***    Physical Exam   ED Triage Vitals [12/04/23 2023]   BP (Non-Invasive) (!) 155/76   Heart Rate 84   Respiratory Rate 18   Temperature 36.2 C (97.2 F)   SpO2 97 %   Weight 79.4 kg (175 lb)   Height 1.6 m (5\' 3" )     {Be sure to review and modify Physical Exam as appropriate. As of Jul 28, 2021 you are only required to have a "medically appropriate" ROS and PE for billing purposes. This help text will disappear when signing your note.:[id number]}  Constitutional:  89 y.o. female who appears in no distress. Normal color, no cyanosis.   HENT:   Head: Normocephalic and atraumatic.   Mouth/Throat: Oropharynx is clear and moist.   Eyes: EOMI, PERRL   Neck: Trachea midline. Neck supple.  Cardiovascular: RRR, No murmurs, rubs or gallops. Intact distal pulses.  Pulmonary/Chest: BS equal bilaterally. No respiratory distress. No wheezes, rales or chest tenderness.   Abdominal: Bowel sounds present and  normal. Abdomen soft, no tenderness, no rebound and no guarding.  Back: No midline spinal tenderness, no paraspinal tenderness, no CVA tenderness.           Musculoskeletal: No edema, tenderness or deformity.  Skin: warm and dry. No rash, erythema, pallor or cyanosis  Psychiatric: normal mood and affect. Behavior is normal.   Neurological: Patient keenly alert and responsive, easily able to raise eyebrows, facial muscles/expressions symmetric, speaking in fluent sentences, moving all extremities equally and fully, normal gait  Patient Data   {Click here to open the ED Workup Activity for clinical data review *This link will automatically disappear upon signing your note*:123}  Labs Ordered/Reviewed   COMPREHENSIVE METABOLIC PANEL, NON-FASTING - Abnormal; Notable for the following components:       Result Value    CHLORIDE 109 (*)     BUN 26 (*)     CREATININE 1.69 (*)     ESTIMATED GFR 29 (*)     GLUCOSE 64 (*)     ALKALINE PHOSPHATASE 109 (*)     AST (SGOT) 47 (*)     OSMOLALITY, CALCULATED 292 (*)     All other components within normal limits    Narrative:     Estimated Glomerular Filtration Rate (eGFR) is calculated using the CKD-EPI (2021) equation, intended for  patients 28 years of age and older. If gender is not documented or "unknown", there will be no eGFR calculation.     CBC WITH DIFF - Abnormal; Notable for the following components:    MPV 10.9 (*)     MONOCYTE % 13 (*)     All other components within normal limits   POC BLOOD GLUCOSE (RESULTS) - Normal   CBC/DIFF    Narrative:     The following orders were created for panel order CBC/DIFF.  Procedure                               Abnormality         Status                     ---------                               -----------         ------                     CBC WITH ZOXW[[id number]]                Abnormal            Final result                 Please view results for these tests on the individual orders.   URINALYSIS, MACROSCOPIC AND MICROSCOPIC  W/CULTURE REFLEX    Narrative:     The following orders were created for panel order URINALYSIS, MACROSCOPIC AND MICROSCOPIC W/CULTURE REFLEX.  Procedure                               Abnormality         Status                     ---------                               -----------         ------                     URINALYSIS, MACROSCOPIC[[id number]]                          In process                 URINALYSIS, MICROSCOPIC[[id number]]                          In process                   Please view results for these tests on the individual orders.   B-TYPE NATRIURETIC PEPTIDE (BNP),PLASMA   THYROID  STIMULATING HORMONE (SENSITIVE TSH)   URINALYSIS, MACROSCOPIC   URINALYSIS, MICROSCOPIC   EXTRA TUBES    Narrative:     The following orders were created for panel order EXTRA TUBES.  Procedure                               Abnormality  Status                     ---------                               -----------         ------                     BLUE TOP YQMV[[id number]]                                    In process                 GRAY TOP MWUX[[id number]]                                    In process                   Please view results for these tests on the individual orders.   BLUE TOP TUBE   GRAY TOP TUBE     XR AP MOBILE CHEST   Final Result by Edi, Radresults In (05/09 2046)   CARDIOMEGALY.  NO ACUTE FINDINGS.            Radiologist location ID: [id number]           Medical Decision Making   ED clinical impression missing, please click on the following link to add Clinical Impressions ***  then refresh the note prior to signing.  {Be sure to fill out the MDM SmartBlock in Notewriter to the left. Do not modify this italicized text, it will disappear upon signing your note:38154}  MDM               ED clinical impression missing, please click on the following link to add Clinical Impressions ***  then refresh the note prior to signing.    Disposition: Data Unavailable  {Critical Care Time  (Optional):37527}     {Remember to refresh your note prior to signing. Use Control + F11 or click the refresh button at the bottom of the note. This reminder text will automatically disappear when you sign your note.:[id number]}

## 2023-12-04 NOTE — ED Nurses Note (Signed)
 Patient's family member out looking for this nurse. This nurse into patient's room. Patient states she feels dizzy and short of breath. Patient requesting something cold to drink. Patient given glass of water per provider's permission. Patient does not report pain, just states "I just feel dizzy again." Patient lying in stretcher in a comfortable positioned. Patient on continuous monitors. VSS. Call bell in reach.

## 2023-12-04 NOTE — ED Nurses Note (Signed)
 Patient given snacks with sugar and cards to maintain glucose. Patient informed glucose will be checked again periodically until glucose is maintained at an appropriate level.

## 2023-12-04 NOTE — ED APP Handoff Note (Signed)
 [facility] - Emergency Department  Emergency Department  Provider in Triage Note    Name: Connie  GENA Barber  Age: 89 y.o.  Gender: female     Subjective:   Connie  PAULET Barber is a 89 y.o. female who presents with complaint of Dizziness, Flank Pain, Shortness of Breath, and Low Blood Sugar  .      Objective:   Filed Vitals:    12/04/23 2023   BP: (!) 155/76   Pulse: 84   Resp: 18   Temp: 36.2 C (97.2 F)   SpO2: 97%      Focused Physical Exam shows dizziness, complaint of flank pain, she breath low blood sugar    Assessment:  A medical screening exam was completed.  This patient is a 89 y.o. female with initial findings showing patient was seen at [facility] for same complaints, diagnosed with vertigo and dehydration and sent home.  Patient states she is still not feeling well.    Plan:  Please see initial orders and work-up below.  This is to be continued with full evaluation in the main Emergency Department.     No current facility-administered medications for this encounter.     Results for orders placed or performed during the hospital encounter of 12/04/23 (from the past 24 hours)   POC BLOOD GLUCOSE (RESULTS)    Collection Time: 12/04/23  8:24 PM   Result Value Ref Range    GLUCOSE, POC 71 70 - 100 mg/dl   CBC/DIFF    Collection Time: 12/04/23  8:27 PM    Narrative    The following orders were created for panel order CBC/DIFF.  Procedure                               Abnormality         Status                     ---------                               -----------         ------                     CBC WITH GNFA[[id number]]                                                                 Please view results for these tests on the individual orders.   URINALYSIS, MACROSCOPIC AND MICROSCOPIC W/CULTURE REFLEX    Collection Time: 12/04/23  8:27 PM    Specimen: Urine, Clean Catch    Narrative    The following orders were created for panel order URINALYSIS, MACROSCOPIC AND MICROSCOPIC W/CULTURE  REFLEX.  Procedure                               Abnormality         Status                     ---------                               -----------         ------  URINALYSIS, MACROSCOPIC[[id number]]                                                     URINALYSIS, MICROSCOPIC[[id number]]                                                       Please view results for these tests on the individual orders.        Kreg Earhart, FNP  12/04/2023, 20:29

## 2023-12-04 NOTE — ED Triage Notes (Signed)
 Dizziness increasing over the last two days, seen at [facility] last week for same s/s, dx with UTI, vertigo and low blood sugar, received IV antibiotics in the ER, accu check 71 in triage

## 2023-12-05 ENCOUNTER — Encounter (HOSPITAL_COMMUNITY): Payer: Self-pay | Admitting: Emergency Medicine

## 2023-12-05 DIAGNOSIS — R9431 Abnormal electrocardiogram [ECG] [EKG]: Secondary | ICD-10-CM

## 2023-12-05 LAB — ECG 12 LEAD
Atrial Rate: 77 {beats}/min
Calculated P Axis: 110 degrees
Calculated R Axis: -16 degrees
Calculated T Axis: -87 degrees
PR Interval: 176 ms
QRS Duration: 94 ms
QT Interval: 378 ms
QTC Calculation: 427 ms
Ventricular rate: 77 {beats}/min

## 2023-12-05 LAB — POC BLOOD GLUCOSE (RESULTS)
GLUCOSE, POC: 104 mg/dL — ABNORMAL HIGH (ref 70–100)
GLUCOSE, POC: 109 mg/dL — ABNORMAL HIGH (ref 70–100)
GLUCOSE, POC: 123 mg/dL — ABNORMAL HIGH (ref 70–100)

## 2023-12-05 NOTE — ED Nurses Note (Addendum)
 Discharge instructions reviewed with patient and all questions answered. Patient verbalizes understanding of discharge instructions. Patient encouraged to check glucose at home periodically and come back to ER if needed. IV removed, pressure dressing applied after no active bleeding noted. Patient out of ER with daughter at this time.

## 2023-12-05 NOTE — Discharge Instructions (Addendum)
 Follow up with the primary care provider.      Return emergency department if worse and as needed.      Be sure to drink enough fluids

## 2024-01-08 ENCOUNTER — Encounter (HOSPITAL_BASED_OUTPATIENT_CLINIC_OR_DEPARTMENT_OTHER): Payer: Self-pay

## 2024-01-08 ENCOUNTER — Inpatient Hospital Stay
Admission: EM | Admit: 2024-01-08 | Discharge: 2024-01-13 | DRG: 638 | Disposition: A | Discharge status: Home / Self Care | Attending: Internal Medicine | Admitting: Internal Medicine

## 2024-01-08 ENCOUNTER — Emergency Department (HOSPITAL_BASED_OUTPATIENT_CLINIC_OR_DEPARTMENT_OTHER)

## 2024-01-08 ENCOUNTER — Other Ambulatory Visit: Payer: Self-pay

## 2024-01-08 DIAGNOSIS — I10 Essential (primary) hypertension: Secondary | ICD-10-CM | POA: Diagnosis present

## 2024-01-08 DIAGNOSIS — Z7982 Long term (current) use of aspirin: Secondary | ICD-10-CM

## 2024-01-08 DIAGNOSIS — Z8249 Family history of ischemic heart disease and other diseases of the circulatory system: Secondary | ICD-10-CM

## 2024-01-08 DIAGNOSIS — E875 Hyperkalemia: Secondary | ICD-10-CM | POA: Diagnosis not present

## 2024-01-08 DIAGNOSIS — R0902 Hypoxemia: Secondary | ICD-10-CM | POA: Diagnosis present

## 2024-01-08 DIAGNOSIS — Z515 Encounter for palliative care: Secondary | ICD-10-CM

## 2024-01-08 DIAGNOSIS — I340 Nonrheumatic mitral (valve) insufficiency: Secondary | ICD-10-CM | POA: Diagnosis present

## 2024-01-08 DIAGNOSIS — E785 Hyperlipidemia, unspecified: Secondary | ICD-10-CM

## 2024-01-08 DIAGNOSIS — R001 Bradycardia, unspecified: Secondary | ICD-10-CM

## 2024-01-08 DIAGNOSIS — Z7984 Long term (current) use of oral hypoglycemic drugs: Secondary | ICD-10-CM

## 2024-01-08 DIAGNOSIS — I272 Pulmonary hypertension, unspecified: Secondary | ICD-10-CM | POA: Diagnosis present

## 2024-01-08 DIAGNOSIS — E162 Hypoglycemia, unspecified: Secondary | ICD-10-CM | POA: Diagnosis present

## 2024-01-08 DIAGNOSIS — Z823 Family history of stroke: Secondary | ICD-10-CM

## 2024-01-08 DIAGNOSIS — N189 Chronic kidney disease, unspecified: Secondary | ICD-10-CM

## 2024-01-08 DIAGNOSIS — E11649 Type 2 diabetes mellitus with hypoglycemia without coma: Principal | ICD-10-CM | POA: Diagnosis present

## 2024-01-08 DIAGNOSIS — I071 Rheumatic tricuspid insufficiency: Secondary | ICD-10-CM | POA: Diagnosis present

## 2024-01-08 DIAGNOSIS — E1165 Type 2 diabetes mellitus with hyperglycemia: Secondary | ICD-10-CM | POA: Diagnosis present

## 2024-01-08 DIAGNOSIS — Z87891 Personal history of nicotine dependence: Secondary | ICD-10-CM

## 2024-01-08 DIAGNOSIS — N184 Chronic kidney disease, stage 4 (severe): Secondary | ICD-10-CM | POA: Diagnosis present

## 2024-01-08 DIAGNOSIS — I2510 Atherosclerotic heart disease of native coronary artery without angina pectoris: Secondary | ICD-10-CM | POA: Diagnosis present

## 2024-01-08 DIAGNOSIS — I509 Heart failure, unspecified: Secondary | ICD-10-CM | POA: Diagnosis present

## 2024-01-08 DIAGNOSIS — Z79890 Hormone replacement therapy (postmenopausal): Secondary | ICD-10-CM

## 2024-01-08 DIAGNOSIS — E1122 Type 2 diabetes mellitus with diabetic chronic kidney disease: Secondary | ICD-10-CM | POA: Diagnosis present

## 2024-01-08 DIAGNOSIS — E16A3 Hypoglycemia level 3: Secondary | ICD-10-CM | POA: Diagnosis present

## 2024-01-08 DIAGNOSIS — Z1152 Encounter for screening for COVID-19: Secondary | ICD-10-CM

## 2024-01-08 DIAGNOSIS — N2581 Secondary hyperparathyroidism of renal origin: Secondary | ICD-10-CM | POA: Diagnosis present

## 2024-01-08 DIAGNOSIS — E119 Type 2 diabetes mellitus without complications: Secondary | ICD-10-CM | POA: Diagnosis present

## 2024-01-08 DIAGNOSIS — N179 Acute kidney failure, unspecified: Secondary | ICD-10-CM

## 2024-01-08 DIAGNOSIS — Z79899 Other long term (current) drug therapy: Secondary | ICD-10-CM

## 2024-01-08 DIAGNOSIS — I130 Hypertensive heart and chronic kidney disease with heart failure and stage 1 through stage 4 chronic kidney disease, or unspecified chronic kidney disease: Secondary | ICD-10-CM | POA: Diagnosis present

## 2024-01-08 LAB — MANUAL DIFFERENTIAL
EOSINOPHIL %: 2 % (ref 0–7)
EOSINOPHIL ABSOLUTE: 0.08 10*3/uL (ref 0.00–0.80)
EOSINOPHILS MANUAL: 2
LYMPHOCYTE %: 26 % (ref 25–45)
LYMPHOCYTE ABSOLUTE: 1.04 10*3/uL — ABNORMAL LOW (ref 1.10–5.00)
LYMPHOCYTES MANUAL: 26
MONOCYTE %: 15 % — ABNORMAL HIGH (ref 0–12)
MONOCYTE ABSOLUTE: 0.6 10*3/uL — ABNORMAL HIGH (ref 0.00–0.30)
MONOCYTES MANUAL: 15
NEUTROPHIL %: 57 % (ref 40–76)
NEUTROPHIL ABSOLUTE: 2.28 10*3/uL (ref 1.80–8.40)
NEUTROPHILS MANUAL: 57
PLATELET MORPHOLOGY COMMENT: NORMAL
RBC MORPHOLOGY COMMENT: NORMAL
TOTAL CELLS COUNTED [#] IN BLOOD: 100
WBC: 4 10*3/uL

## 2024-01-08 LAB — URINALYSIS, MACRO/MICRO
BILIRUBIN: NEGATIVE mg/dL
GLUCOSE: NEGATIVE mg/dL
KETONES: NEGATIVE mg/dL
NITRITE: NEGATIVE
PH: 6 (ref 4.6–8.0)
PROTEIN: NEGATIVE mg/dL
SPECIFIC GRAVITY: <=1.005 (ref 1.003–1.035)
UROBILINOGEN: 0.2 mg/dL (ref 0.2–1.0)

## 2024-01-08 LAB — COMPREHENSIVE METABOLIC PANEL, NON-FASTING
ALBUMIN/GLOBULIN RATIO: 0.8 (ref 0.8–1.4)
ALBUMIN/GLOBULIN RATIO: 1.1 (ref 0.8–1.4)
ALBUMIN: 3.1 g/dL — ABNORMAL LOW (ref 3.4–5.0)
ALBUMIN: 3.6 g/dL (ref 3.5–5.7)
ALKALINE PHOSPHATASE: 110 U/L (ref 46–116)
ALKALINE PHOSPHATASE: 83 U/L (ref 34–104)
ALT (SGPT): 28 U/L (ref <=78)
ALT (SGPT): 17 U/L (ref 7–52)
ANION GAP: 4 mmol/L (ref 4–13)
ANION GAP: 7 mmol/L (ref 4–13)
AST (SGOT): 26 U/L (ref 15–37)
AST (SGOT): 20 U/L (ref 13–39)
BILIRUBIN TOTAL: 0.3 mg/dL (ref 0.2–1.0)
BILIRUBIN TOTAL: 0.4 mg/dL (ref 0.3–1.0)
BUN/CREA RATIO: 11
BUN/CREA RATIO: 14 (ref 6–22)
BUN: 23 mg/dL — ABNORMAL HIGH (ref 7–18)
BUN: 23 mg/dL (ref 7–25)
CALCIUM, CORRECTED: 9.3 mg/dL
CALCIUM, CORRECTED: 9.2 mg/dL (ref 8.9–10.8)
CALCIUM: 8.6 mg/dL (ref 8.5–10.1)
CALCIUM: 8.9 mg/dL (ref 8.6–10.3)
CHLORIDE: 107 mmol/L (ref 98–107)
CHLORIDE: 107 mmol/L (ref 98–107)
CO2 TOTAL: 30 mmol/L (ref 21–32)
CO2 TOTAL: 27 mmol/L (ref 21–31)
CREATININE: 2.04 mg/dL — ABNORMAL HIGH (ref 0.55–1.02)
CREATININE: 1.69 mg/dL — ABNORMAL HIGH (ref 0.60–1.30)
ESTIMATED GFR: 23 mL/min/{1.73_m2} — ABNORMAL LOW (ref >59)
ESTIMATED GFR: 29 mL/min/{1.73_m2} — ABNORMAL LOW (ref >59)
GLOBULIN: 3.8
GLOBULIN: 3.3 (ref 2.0–3.5)
GLUCOSE: 55 mg/dL — ABNORMAL LOW (ref 74–106)
GLUCOSE: 134 mg/dL — ABNORMAL HIGH (ref 74–109)
OSMOLALITY, CALCULATED: 283 mosm/kg (ref 270–290)
OSMOLALITY, CALCULATED: 287 mosm/kg (ref 270–290)
POTASSIUM: 4.2 mmol/L (ref 3.5–5.1)
POTASSIUM: 4.1 mmol/L (ref 3.5–5.1)
PROTEIN TOTAL: 6.9 g/dL (ref 6.4–8.2)
PROTEIN TOTAL: 6.9 g/dL (ref 6.4–8.9)
SODIUM: 141 mmol/L (ref 136–145)
SODIUM: 141 mmol/L (ref 136–145)

## 2024-01-08 LAB — POC BLOOD GLUCOSE (RESULTS)
GLUCOSE, POC: 90 mg/dL (ref 70–100)
GLUCOSE, POC: 115 mg/dL — ABNORMAL HIGH (ref 70–100)
GLUCOSE, POC: 118 mg/dL — ABNORMAL HIGH (ref 70–100)
GLUCOSE, POC: 117 mg/dL — ABNORMAL HIGH (ref 70–100)
GLUCOSE, POC: 73 mg/dL (ref 70–100)
GLUCOSE, POC: 224 mg/dL — ABNORMAL HIGH (ref 70–100)
GLUCOSE, POC: 67 mg/dL — ABNORMAL LOW (ref 70–100)
GLUCOSE, POC: 113 mg/dL — ABNORMAL HIGH (ref 70–100)
GLUCOSE, POC: 103 mg/dL — ABNORMAL HIGH (ref 70–100)

## 2024-01-08 LAB — URINALYSIS, MICROSCOPIC

## 2024-01-08 LAB — CBC WITH DIFF
HCT: 36.3 % (ref 31.2–41.9)
HGB: 11.7 g/dL (ref 10.9–14.3)
MCH: 29 pg (ref 24.7–32.8)
MCHC: 32.1 g/dL — ABNORMAL LOW (ref 32.3–35.6)
MCV: 90.3 fL (ref 75.5–95.3)
MPV: 10.5 fL (ref 7.9–10.8)
PLATELETS: 164 10*3/uL (ref 140–440)
RBC: 4.02 10*6/uL (ref 3.63–4.92)
RDW: 18.9 % — ABNORMAL HIGH (ref 12.3–17.7)
WBC: 4 10*3/uL (ref 3.8–11.8)

## 2024-01-08 LAB — MAGNESIUM: MAGNESIUM: 2 mg/dL (ref 1.9–2.7)

## 2024-01-08 LAB — COVID-19, FLU A/B, RSV RAPID BY PCR
INFLUENZA VIRUS TYPE A: NOT DETECTED
INFLUENZA VIRUS TYPE B: NOT DETECTED
RESPIRATORY SYNCTIAL VIRUS (RSV): NOT DETECTED
SARS-CoV-2: NOT DETECTED

## 2024-01-08 LAB — PT/INR
INR: 1.11 — ABNORMAL HIGH (ref 0.84–1.10)
PROTHROMBIN TIME: 12.5 s (ref 9.8–12.7)

## 2024-01-08 LAB — TROPONIN-I: TROPONIN I: 12 ng/L (ref <15)

## 2024-01-08 LAB — LIPASE: LIPASE: 10 U/L — ABNORMAL LOW (ref 11–82)

## 2024-01-08 LAB — NT-PROBNP: NT-PROBNP: 1574 pg/mL — ABNORMAL HIGH (ref <=450)

## 2024-01-08 LAB — PTT (PARTIAL THROMBOPLASTIN TIME): APTT: 28.3 s (ref 25.0–38.0)

## 2024-01-08 MED ORDER — DEXTROSE 50 % IN WATER (D50W) INTRAVENOUS SYRINGE
12.5000 g | INJECTION | INTRAVENOUS | Status: DC
Start: 2024-01-08 — End: 2024-01-13
  Administered 2024-01-08: 0 mL via INTRAVENOUS

## 2024-01-08 MED ORDER — DEXTROSE 40 % ORAL GEL
15.0000 g | ORAL | Status: DC | PRN
Start: 2024-01-08 — End: 2024-01-13

## 2024-01-08 MED ORDER — FUROSEMIDE 10 MG/ML INJECTION SOLUTION
40.0000 mg | Freq: Two times a day (BID) | INTRAMUSCULAR | Status: AC
Start: 2024-01-08 — End: 2024-01-11
  Administered 2024-01-08 – 2024-01-11 (×6): 40 mg via INTRAVENOUS
  Filled 2024-01-08 (×6): qty 4

## 2024-01-08 MED ORDER — HEPARIN (PORCINE) 5,000 UNIT/ML INJECTION SOLUTION
5000.0000 [IU] | Freq: Three times a day (TID) | INTRAMUSCULAR | Status: DC
Start: 2024-01-08 — End: 2024-01-13
  Administered 2024-01-08 – 2024-01-13 (×15): 5000 [IU] via SUBCUTANEOUS
  Filled 2024-01-08 (×15): qty 1

## 2024-01-08 MED ORDER — DEXTROSE 50 % IN WATER (D50W) INTRAVENOUS SYRINGE
INJECTION | INTRAVENOUS | Status: AC
Start: 2024-01-08 — End: 2024-01-09
  Filled 2024-01-08: qty 50

## 2024-01-08 MED ORDER — DEXTROSE 50 % IN WATER (D50W) INTRAVENOUS SYRINGE
12.5000 g | INJECTION | INTRAVENOUS | Status: DC | PRN
Start: 2024-01-08 — End: 2024-01-13
  Administered 2024-01-08: 50 mL via INTRAVENOUS

## 2024-01-08 MED ORDER — GLUCAGON HCL 1 MG/ML SOLUTION FOR INJECTION
1.0000 mg | Freq: Once | INTRAMUSCULAR | Status: DC | PRN
Start: 2024-01-08 — End: 2024-01-13

## 2024-01-08 MED ORDER — DEXTROSE 5 % AND 0.45 % SODIUM CHLORIDE INTRAVENOUS SOLUTION
INTRAVENOUS | Status: DC
Start: 2024-01-08 — End: 2024-01-08
  Administered 2024-01-08: 0 mL via INTRAVENOUS

## 2024-01-08 MED ORDER — DEXTROSE 10 % IN WATER (D10W) INTRAVENOUS SOLUTION
INTRAVENOUS | Status: DC
Start: 2024-01-08 — End: 2024-01-13
  Administered 2024-01-11: 0 mL via INTRAVENOUS

## 2024-01-08 MED ORDER — DEXTROSE 50 % IN WATER (D50W) INTRAVENOUS SYRINGE
25.0000 g | INJECTION | INTRAVENOUS | Status: AC
Start: 2024-01-08 — End: 2024-01-08
  Administered 2024-01-08: 50 mL via INTRAVENOUS

## 2024-01-08 NOTE — Assessment & Plan Note (Signed)
 Patient will be started on D10 at 50 cc an hour.  We will try to minimize the patient's fluid, she does have overload with elevated BNP, and 3+ pitting edema.  She will have regular diet.  All diabetic make medications currently discontinued.

## 2024-01-08 NOTE — ED Nurses Note (Signed)
 Report called to [facility]. Patient is out of facility at this time via stretcher x 2 attendants with BRS in route to room 215a at [facility]. All personal belongings and pertinent paperwork sent with EMS. No acute distress observed at this time.

## 2024-01-08 NOTE — Assessment & Plan Note (Signed)
 The patient will have Lasix  40 IV b.i.d..  We will monitor her kidney function as well as electrolytes.  Echocardiogram ordered for a.m..  The patient is on looks like small dose of HCTZ only at home at this time.

## 2024-01-08 NOTE — ED Provider Notes (Signed)
 [facility]  ED Primary Provider Note  Patient Name: Connie Barber  Connie Barber Barber  Patient Age: 89 y.o.  Date of Birth: 1934/10/26    Chief Complaint: Hypoglycemia (/Pt sent from home where her blood sugar was 42. Gave an amp of D50 by EMS. Blood sugar according to EMS 222 upon arrival to the ER)        History of Present Illness       Connie Barber  Melven Stable Barber is a 89 y.o. female who had concerns including Hypoglycemia.  PATIENT PRESENTED TO THE EMERGENCY DEPARTMENT WITH COMPLAINTS OF LOW BLOOD GLUCOSE AT HOME.  PATIENT HAS BEEN DEALING WITH LOW BLOOD GLUCOSE FOR SEVERAL WEEKS.  SHE ACTUALLY STOPPED TAKING HER GLIMEPIRIDE A WEEK AGO BECAUSE SHE WAS HAVING LOW BLOOD GLUCOSE.  SHE HAD NOT TAKEN HER JARDIANCE  IN A WEEK UNTIL SHE DECIDED TO TAKE IT LAST NIGHT.  PATIENT DOES ADMIT THAT HER BLOOD GLUCOSE WAS IN THE 50S WHEN SHE DECIDED TO TAKE HER JARDIANCE .  FAMILY MEMBERS HAD A HARD TIME GETTING HER OUT OF BED THIS MORNING BECAUSE SHE WAS SO WEAK.  THEY CHECKED HER BLOOD GLUCOSE AND IT WAS IN THE 40S.  THEY DID ADMINISTER ORAL GLUCOSE AND EMS ALSO REPORTED LOW BLOOD GLUCOSE UPON THEIR ARRIVAL.  D50 WAS ADMINISTERED WITH IMPROVEMENT IN BLOOD GLUCOSE TO GREATER THAN 200.  AT TIME OF EXAMINATION, SHE WAS RESTING COMFORTABLY AND IN NO APPARENT DISTRESS.  SHE STATES THAT SHE FEELS FINE.  SHE DENIES ANY HEADACHE, CHANGES IN VISION, CHEST PAIN, PALPITATIONS, SHORTNESS OF BREATH, ABDOMINAL PAIN OR RECENT CHANGES IN BOWEL OR BLADDER HABITS.  SHE DOES ADMIT TO CHRONIC SWELLING OF THE LOWER EXTREMITIES IN HIS SUPPOSED TO BE TAKING FLUID PILLS, BUT HAS NOT TAKEN THEM IN SOME TIME.  SHE WAS ALERT AND ORIENTED AND IN NO APPARENT DISTRESS.  SHE DENIES ANY FURTHER COMPLAINTS AND FAMILY MEMBERS DENY ANY FURTHER CONCERNS.        Review of Systems     No other overt Review of Systems are noted to be positive except noted in the HPI.      Historical Data   History Reviewed This Encounter: Medical History  Surgical History   Family History  Social History      Physical Exam   ED Triage Vitals [01/08/24 1201]   BP (Non-Invasive) 137/67   Heart Rate (!) 48   Respiratory Rate 17   Temperature 36.3 C (97.4 F)   SpO2 99 %   Weight 86.2 kg (190 lb)   Height 1.626 m (5' 4)         Nursing notes reviewed for what could be assessed. Past Medical, Surgical, and Social history reviewed for what has been completed.     Constitutional:  ELDERLY FEMALE IN NO APPARENT DISTRESS.  AFEBRILE.  Head: Normocephalic, atraumatic.  Mouth/Throat: no nasal discharge, posterior pharynx WNL  Eyes: EOM grossly intact, conjunctiva normal.  PERRLA.  NO NYSTAGMUS.  Cardiovascular:  SLIGHTLY BRADYCARDIC WITH A NORMAL RHYTHM  Pulmonary/Chest: No respiratory distress. Lungs are symmetric to auscultation bilaterally.  Abdominal: Soft, non-tender, non-distended.   MSK:  CHRONIC APPEARING EDEMA BILATERAL LOWER EXTREMITIES.  NO SIGNIFICANT PITTING AND NO WEEPING.  DISTAL PULSES INTACT  Skin: Warm, dry, and intact  Neuro: Appropriate, CN II-XII grossly intact   Psych: Cooperative           Procedures      Patient Data     Labs Ordered/Reviewed   COMPREHENSIVE METABOLIC PANEL, NON-FASTING - Abnormal; Notable  for the following components:       Result Value    BUN 23 (*)     CREATININE 2.04 (*)     ESTIMATED GFR 23 (*)     ALBUMIN 3.1 (*)     GLUCOSE 55 (*)     All other components within normal limits    Narrative:     Estimated Glomerular Filtration Rate (eGFR) is calculated using the CKD-EPI (2021) equation, intended for patients 17 years of age and older. If gender is not documented or unknown, there will be no eGFR calculation.   PT/INR - Abnormal; Notable for the following components:    INR 1.11 (*)     All other components within normal limits    Narrative:     In the setting of warfarin therapy, a moderate-intensity INR goal range is 2.0 to 3.0 and a high-intensity INR goal range is 2.5 to 3.5.    INR is ONLY validated to determine the level of anticoagulation  with vitamin K antagonists (warfarin). Other factors may elevate the INR including but not limited to direct oral anticoagulants (DOACs), liver dysfunction, vitamin K deficiency, DIC, factor deficiencies, and factor inhibitors.   CBC WITH DIFF - Abnormal; Notable for the following components:    MCHC 32.1 (*)     RDW 18.9 (*)     All other components within normal limits   URINALYSIS, MACRO/MICRO - Abnormal; Notable for the following components:    LEUKOCYTES Trace (*)     BLOOD Trace (*)     All other components within normal limits   NT-PROBNP - Abnormal; Notable for the following components:    NT-PROBNP 1,574 (*)     All other components within normal limits   MANUAL DIFFERENTIAL - Abnormal; Notable for the following components:    MONOCYTE % 15 (*)     LYMPHOCYTE ABSOLUTE 1.04 (*)     MONOCYTE ABSOLUTE 0.60 (*)     All other components within normal limits   URINALYSIS, MICROSCOPIC - Abnormal; Notable for the following components:    BACTERIA Few (*)     All other components within normal limits   POC BLOOD GLUCOSE (RESULTS) - Abnormal; Notable for the following components:    GLUCOSE, POC 115 (*)     All other components within normal limits   POC BLOOD GLUCOSE (RESULTS) - Abnormal; Notable for the following components:    GLUCOSE, POC 118 (*)     All other components within normal limits   POC BLOOD GLUCOSE (RESULTS) - Abnormal; Notable for the following components:    GLUCOSE, POC 117 (*)     All other components within normal limits   PTT (PARTIAL THROMBOPLASTIN TIME) - Normal   TROPONIN-I - Normal    Narrative:     Values received on females ranging between 12-15 ng/L MUST include the next serial troponin to review changes in the delta differences as the reference range for the Access II chemistry analyzer is lower than the established reference range.     POC BLOOD GLUCOSE (RESULTS) - Normal   URINE CULTURE,ROUTINE   CBC/DIFF    Narrative:     The following orders were created for panel order  CBC/DIFF.  Procedure                               Abnormality         Status                     ---------                               -----------         ------  CBC WITH DIFF[[id number]]                Abnormal            Final result               MANUAL DIFFERENTIAL[[id number]]          Abnormal            Final result                 Please view results for these tests on the individual orders.   URINALYSIS WITH REFLEX MICROSCOPIC AND CULTURE IF POSITIVE    Narrative:     The following orders were created for panel order URINALYSIS WITH REFLEX MICROSCOPIC AND CULTURE IF POSITIVE.  Procedure                               Abnormality         Status                     ---------                               -----------         ------                     URINALYSIS, MACRO/MICRO[[id number]]      Abnormal            Final result               URINALYSIS, MICROSCOPIC[[id number]]      Abnormal            Final result                 Please view results for these tests on the individual orders.   PERFORM POC WHOLE BLOOD GLUCOSE       XR AP MOBILE CHEST   Final Result by Edi, Radresults In (06/13 1428)   NO ACUTE FINDINGS.            Radiologist location ID: [id number]             Medical Decision Making          Medical Decision Making  Amount and/or Complexity of Data Reviewed  Labs: ordered.  Radiology: ordered.  ECG/medicine tests: ordered.    Risk  Prescription drug management.  Decision regarding hospitalization.          Studies Assessed:  LAB WORK, IMAGING, EKG    EKG:   This EKG interpreted by me shows:    Rate: 47    Interpretation:  LEFT AXIS DEVIATION, SINUS BRADYCARDIA, RATE 47, NONSPECIFIC ST-T WAVE CHANGES      MDM Narrative:  PATIENT PRESENTED TO THE EMERGENCY DEPARTMENT WITH COMPLAINTS OF LOW BLOOD GLUCOSE AT HOME.  PATIENT HAS BEEN DEALING WITH LOW BLOOD GLUCOSE FOR SEVERAL WEEKS.  SHE ACTUALLY STOPPED TAKING HER GLIMEPIRIDE A WEEK AGO BECAUSE SHE WAS HAVING LOW BLOOD GLUCOSE.  SHE  HAD NOT TAKEN HER JARDIANCE  IN A WEEK UNTIL SHE DECIDED TO TAKE IT LAST NIGHT.  PATIENT DOES ADMIT THAT HER BLOOD GLUCOSE WAS IN THE 50S WHEN SHE DECIDED TO TAKE HER JARDIANCE .  FAMILY MEMBERS HAD A HARD TIME GETTING HER OUT OF BED THIS MORNING BECAUSE SHE WAS SO WEAK.  THEY CHECKED HER  BLOOD GLUCOSE AND IT WAS IN THE 40S.  THEY DID ADMINISTER ORAL GLUCOSE AND EMS ALSO REPORTED LOW BLOOD GLUCOSE UPON THEIR ARRIVAL.  D50 WAS ADMINISTERED WITH IMPROVEMENT IN BLOOD GLUCOSE TO GREATER THAN 200.  AT TIME OF EXAMINATION, SHE WAS RESTING COMFORTABLY AND IN NO APPARENT DISTRESS.  SHE STATES THAT SHE FEELS FINE.  SHE DENIES ANY HEADACHE, CHANGES IN VISION, CHEST PAIN, PALPITATIONS, SHORTNESS OF BREATH, ABDOMINAL PAIN OR RECENT CHANGES IN BOWEL OR BLADDER HABITS.  SHE DOES ADMIT TO CHRONIC SWELLING OF THE LOWER EXTREMITIES IN HIS SUPPOSED TO BE TAKING FLUID PILLS, BUT HAS NOT TAKEN THEM IN SOME TIME.  SHE WAS ALERT AND ORIENTED AND IN NO APPARENT DISTRESS.  SHE DENIES ANY FURTHER COMPLAINTS AND FAMILY MEMBERS DENY ANY FURTHER CONCERNS.  PHYSICAL EXAMINATION REVEALS AN ELDERLY FEMALE IN NO APPARENT DISTRESS.  SHE IS ALERT AND ORIENTED X4.  HEART RATE WAS SLIGHTLY BRADYCARDIC, BUT RHYTHM WAS UNREMARKABLE.  LUNGS WERE CLEAR TO AUSCULTATION BILATERALLY WITHOUT ANY EVIDENCE OF RALES, RHONCHI OR WHEEZING AT TIME OF EXAMINATION.  ABDOMEN IS SOFT AND NONDISTENDED WITH NO TENDERNESS TO PALPATION.  THERE WAS SOME CHRONIC APPEARING SWELLING IN BILATERAL LOWER EXTREMITIES, BUT NO WEEPING OR SIGNIFICANT PITTING EDEMA WAS PRESENT.  DISTAL PULSES INTACT.  LAB WORK AND IMAGING ORDERED.  PATIENT WAS STABLE.  PATIENT ADMITTED THAT HER HEART RATE GOES UP AND DOWN.      ED Course as of 01/08/24 1725   Fri Jan 08, 2024   1212 POINT OF CARE BLOOD GLUCOSE 90   1258 WBC 4.0, HEMOGLOBIN 11.7, PLATELET COUNT 164   1311 SODIUM 141, POTASSIUM 4.2, BUN 23, CREATININE 2.04, GLUCOSE 55, TOTAL BILIRUBIN 0.3, AST 26, ALT 28, TOTAL ALKALINE PHOSPHATASE 110    1312 PTT 28.3, PT 12.5, INR 1.11   1312 D50 WAS ORDERED   1316 D5 HALF-NORMAL SALINE AT 100 ML/HR WAS ORDERED.  SECURE CHAT SENT TO HOSPITALIST SERVICE TO EVALUATE FOR ADMISSION   1316 TROPONIN 12   1342 PRO BNP 1574   1409 POINT OF CARE BLOOD GLUCOSE 115   1440 URINALYSIS DID NOT REVEAL EVIDENCE OF URINARY TRACT INFECTION   1440 CHEST X-RAY REVEALED:   FINDINGS:     Heart size is mildly enlarged.     There is a linear density in the left midlung unchanged from 11/25/2022 likely chronic atelectasis or scarring. No acute findings.            IMPRESSION:  NO ACUTE FINDINGS.   1504 POINT OF CARE BLOOD GLUCOSE 118   1603 PATIENT PLACED ON NASAL CANNULA OXYGEN SUPPLEMENTATION   1607 POINT OF CARE BLOOD GLUCOSE 117     ON REEXAMINATION, PATIENT WAS RESTING COMFORTABLY AND IN NO APPARENT DISTRESS.  SHE WAS COUNSELED AND EDUCATED ON LABORATORY AND IMAGING FINDINGS.  ALL QUESTIONS WERE ANSWERED TO SATISFACTION.  PATIENT IS AGREEABLE TO ADMISSION FOR FURTHER TREATMENT AND EVALUATION AT THIS TIME.  CASE WAS DISCUSSED WITH HOSPITALIST SERVICE, WHO WAS AGREEABLE TO ADMISSION.  ALL QUESTIONS WERE ANSWERED TO SATISFACTION.  PATIENT STABLE AT TIME OF ADMISSION.    EMS ARRIVED TO TRANSPORT THE PATIENT TO [facility].  PATIENT WAS STABLE WITHOUT ANY NEW COMPLAINTS OR CONCERNS AT TIME OF TRANSFER.    Medications Ordered/Administered in the ED   dextrose  50% (0.5 g/mL) injection - syringe (has no administration in time range)       Patient will be admitted to the  service for further workup and management.    Disposition: Admitted  Clinical Impression   Type 2 diabetes mellitus with hypoglycemia (Primary)   Acute on chronic renal failure   Bradycardia   Coronary artery disease, unspecified vessel or lesion type, unspecified whether angina present, unspecified whether native or transplanted heart         Current Discharge Medication List            Renne Casa, DO

## 2024-01-08 NOTE — ED Nurses Note (Signed)
 Patients family states that patient was in and out of it this morning due to a low glucose. States reading was in the 40's prior to EMS arrival. Patient is alert and oriented x4. Denies any complaints at this time. No acute distress observed. Will monitor.

## 2024-01-08 NOTE — H&P (Signed)
 [place] [facility] ([place])    HOSPITALIST H&P    Connie  [name] 89 y.o. female 215/A   Date of Service: 01/08/2024    Date of Admission:  01/08/2024   PCP: Temple Feeler, MD Code Status:Prior       Chief Complaint: low blood sugar     HPI:   This 89 year old African American female with known history of chronic kidney disease, chronic kidney disease, diabetes mellitus, and hyperlipidemia, presents to the ED with the complaints of low blood sugar.  Apparently this has been going on for last couple of weeks.  She has stopped taking her Januvia and glimepiride at home, however did apparently take a dose of Januvia yesterday.  The patient's blood sugars has been going down into the 50s at home.  She does state that she has been eating and drinking well.  She has not had any vomiting or diarrhea.  She denies any fever or chills.  She does state that she gets dizzy when her blood sugars are low.  She also reports worsening of pedal edema.  This apparently is chronic, and the patient has not had any recent changes to diuretics.  She does not wear any oxygen at home, but was apparently hypoxic at time of arrival to the ED, was placed on oxygen at 2 L. she apparently had a blood sugar in the 40s from EMS, she was given an amp of D50.  When she arrived in the ED was 200, however began dropping.  On laboratory studies sugar was down to 55.  Her creatinine is mildly elevated at 2.04.  White count was normal.  Patient was started on D5 half-normal saline at 100 cc an hour, and was sent to our facility for admission.  Upon arrival patient's blood sugars again were down in the 50s.  She was given additional amp of D50 while here, blood sugar at time of my examination is 223.  Patient states she does not take any insulin at home.  She is again not taken any of her diabetic medications who has had for 1 dose of Januvia yesterday for some reason.  She is eating at time of examination, and does not appear in any  distress.  She will be continued on dextrose , with hypoglycemic protocols, we will also ask for diuresis, as the patient does have 3+ pitting pedal edema noted on examination.  Echocardiogram was not in our system, we will be ordering it for the a.m.Aaron Aas    ED medications:   Medications Ordered/Administered in the ED   dextrose  50% (0.5 g/mL) injection - syringe (has no administration in time range)         PMHx:    Past Medical History:   Diagnosis Date    CKD (chronic kidney disease)     Stage 3b    Coronary artery disease     Diabetes mellitus     Dyslipidemia     Essential hypertension     Hepatitis C     Wears dentures     Wears glasses     PSHx:   Past Surgical History:   Procedure Laterality Date    CARDIAC CATHETERIZATION  11/20/2014    HX APPENDECTOMY      HX HYSTERECTOMY         Allergies:    Allergies[1] Social History  Social History[2]    Family History  Family Medical History:       Problem Relation (Age of Onset)  Heart Disease Mother    Hypertension (High Blood Pressure) Mother    No Known Problems Sister, Brother, Maternal Grandmother, Maternal Grandfather, Paternal Grandmother, Paternal Grandfather, Daughter, Son, Maternal Aunt, Maternal Uncle, Paternal Aunt, Paternal Uncle, Other    Stroke Father               Home Meds:      Prior to Admission medications    Medication Sig Start Date End Date Taking? 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 11/24/23 02/22/24 Yes [facility], NP   Colchicine-Probenecid 500-0.5 mg Oral Tablet Take 1 Tablet by mouth Every evening   Yes Provider,  Historical   cyanocobalamin  (VITAMIN B 12) 1,000 mcg Oral Tablet Take 1 Tablet (1,000 mcg total) by mouth Daily   Yes Provider, Historical   ezetimibe  (ZETIA ) 10 mg Oral Tablet Take 1 Tablet (10 mg total) by mouth Every morning for 180 days 11/19/21 11/24/23  Christ, April, FNP-C   glimepiride (AMARYL) 1 mg Oral Tablet Take 1 Tablet (1 mg total) by mouth Every morning with breakfast  Patient not taking: Reported on 01/08/2024    Provider, Historical   glimepiride (AMARYL) 2 mg Oral Tablet Take 1 Tablet (2 mg total) by mouth Every morning with breakfast  Patient not taking: Reported on 01/08/2024    Provider, Historical   hydroCHLOROthiazide (HYDRODIURIL) 12.5 mg Oral Tablet Take 1 Tablet (12.5 mg total) by mouth Once a day 08/05/22  Yes Provider, Historical   levothyroxine (SYNTHROID) 75 mcg Oral Tablet Take 1 Tablet (75 mcg total) by mouth Every morning   Yes Provider, Historical   magnesium  oxide (MAG-OX) 400 mg Oral Tablet Take 1 Tablet (400 mg total) by mouth Daily for 90 days Indications: low amount of magnesium  in the blood  Patient not taking: Reported on 01/08/2024 11/24/23 02/22/24  [facility], NP   pravastatin (PRAVACHOL) 10 mg Oral Tablet Take 1 Tablet (10 mg total) by mouth Every morning   Yes Provider, Historical   SITagliptin phosphate (JANUVIA) 50 mg Oral Tablet Take 1 Tablet (50 mg total) by mouth Daily  Patient not taking: Reported on 01/08/2024    Provider, Historical   telmisartan  (MICARDIS ) 80 mg Oral Tablet Take 1 Tablet (80 mg total) by mouth Daily for 90 days 11/24/23 02/22/24  [facility], NP          ROS:   General: No fever or chills. No weight changes, fatigue, weakness.  Patient is somewhat altered with hypoglycemia episodes.  Now alert and oriented.  HEENT: No headaches, dizziness, changes in vision, changes in hearing, or difficulty swallowing.    Skin:  No rashes, erythema or bruises.   Cardiac: No chest pain, palpitations, or arrhythmia.    Respiratory: No shortness of breath, cough,  or wheezing.  GI: No nausea or vomiting. No abdominal pain.   Urinary: No dysuria, hematuria, or change in frequency.    Vascular:  Chronic swelling of lower extremities.  Apparently worse than usual.  Patient having difficulty ambulating as per family  Musculoskeletal: No muscle weakness, pain, or decreased range  of motion.   Neurologic: No loss of sensation, numbness or tingling.   Endocrine: No heat or cold intolerance or polydipsia.   Psychiatric: No insomnia, depression or anxiety.      Results for orders placed or performed during the hospital encounter of 01/08/24 (from the past 24 hours)   POC BLOOD GLUCOSE (RESULTS)   Result Value Ref Range    GLUCOSE, POC 90 70 - 100 mg/dl   ECG 12 LEAD   Result Value Ref Range    Ventricular rate 47 BPM    Atrial Rate 47 BPM    PR Interval 192 ms    QRS Duration 94 ms    QT Interval 478 ms    QTC Calculation 423 ms    Calculated P Axis 63 degrees    Calculated R Axis -24 degrees    Calculated T Axis -53 degrees   COMPREHENSIVE METABOLIC PANEL, NON-FASTING   Result Value Ref Range    SODIUM 141 136 - 145 mmol/L    POTASSIUM 4.2 3.5 - 5.1 mmol/L    CHLORIDE 107 98 - 107 mmol/L    CO2 TOTAL 30 21 - 32 mmol/L    ANION GAP 4 4 - 13 mmol/L    BUN 23 (H) 7 - 18 mg/dL    CREATININE 1.61 (H) 0.55 - 1.02 mg/dL    BUN/CREA RATIO 11     ESTIMATED GFR 23 (L) >59 mL/min/1.1m^2    ALBUMIN 3.1 (L) 3.4 - 5.0 g/dL    CALCIUM 8.6 8.5 - 09.6 mg/dL    GLUCOSE 55 (L) 74 - 106 mg/dL    ALKALINE PHOSPHATASE 110 46 - 116 U/L    ALT (SGPT) 28 <=78 U/L    AST (SGOT) 26 15 - 37 U/L    BILIRUBIN TOTAL 0.3 0.2 - 1.0 mg/dL    PROTEIN TOTAL 6.9 6.4 - 8.2 g/dL    ALBUMIN/GLOBULIN RATIO 0.8 0.8 - 1.4    OSMOLALITY, CALCULATED 283 270 - 290 mOsm/kg    CALCIUM, CORRECTED 9.3 mg/dL    GLOBULIN 3.8    PT/INR   Result Value Ref Range    PROTHROMBIN TIME 12.5 9.8 - 12.7 seconds    INR 1.11 (H) 0.84 - 1.10   PTT (PARTIAL THROMBOPLASTIN TIME)   Result Value Ref Range    APTT 28.3 25.0 - 38.0 seconds   TROPONIN-I  NOW   Result Value Ref Range    TROPONIN I 12 <15 ng/L   CBC WITH DIFF   Result Value Ref Range    WBC 4.0 3.8 - 11.8 x10^3/uL    RBC 4.02 3.63 - 4.92 x10^6/uL    HGB 11.7 10.9 - 14.3 g/dL    HCT 04.5 40.9 - 81.1 %    MCV 90.3 75.5 - 95.3 fL    MCH 29.0 24.7 - 32.8 pg    MCHC 32.1 (L) 32.3 - 35.6 g/dL    RDW 91.4 (H) 78.2 - 17.7 %    PLATELETS 164 140 - 440 x10^3/uL    MPV 10.5 7.9 - 10.8 fL   NT-PROBNP   Result Value Ref Range    NT-PROBNP 1,574 (H) <=450 pg/mL   MANUAL DIFFERENTIAL   Result Value Ref Range    WBC 4.0 x10^3/uL    NEUTROPHIL % 57 40 - 76 %    LYMPHOCYTE % 26 25 - 45 %    MONOCYTE % 15 (H) 0 - 12 %    EOSINOPHIL % 2 0 - 7 %  BASOPHIL %      METAMYELOCYTE %      MYELOCYTE %      PROMYELOCYTE %      BAND %      BLAST %      OTHER %      NEUTROPHIL ABSOLUTE 2.28 1.80 - 8.40 x10^3/uL    LYMPHOCYTE ABSOLUTE 1.04 (L) 1.10 - 5.00 x10^3/uL    MONOCYTE ABSOLUTE 0.60 (H) 0.00 - 0.30 x10^3/uL    EOSINOPHIL ABSOLUTE 0.08 0.00 - 0.80 x10^3/uL    BASOPHIL ABSOLUTE      METAMYELOCYTE ABSOLUTE      MYELOCYTE ABSOLUTE      PROMYELOCYTE ABSOLUTE      BLAST ABSOLUTE      OTHER CELL ABSOLUTE      ANISOCYTOSIS      POLYCHROMASIA      POIKILOCYTOSIS      BASOPHILIC STIPPLING      MICROCYTOSIS      MACROCYTOSIS      ROULEAUX      SCHISTOCYTES      SPHEROCYTES      TARGET CELLS      TEARDROP CELLS      OVALOCYTE (ELLIPTOCYTE)      CRENATED RED CELLS      STOMATOCYTES      ACANTHOCYTES (SPUR CELL)      ECHINOCYTE (BURR CELL)      BLISTER CELLS      RBC AGGLUTINATES      HOWELL JOLLY BODIES      ATYPICAL LYMPHOCYTES      TOXIC GRANULATION      DOHLE BODIES      TOXIC VACUOLIZATION      AUER RODS      BASKET CELLS      HYPERSEGMENTATION      LARGE PLATELETS      PLATELET CLUMPS      RBC MORPHOLOGY COMMENT Normal     PLATELET MORPHOLOGY COMMENT Normal     BANDS NEUTROPHILS MANUAL      BAND ABSOLUTE      NEUTROPHILS MANUAL 57     LYMPHOCYTES MANUAL 26     MONOCYTES MANUAL 15     EOSINOPHILS MANUAL 2     BASOPHILS MANUAL       PROMYELOCYTES MANUAL      MYELOCYTES MANUAL      METAMYELOCYTES MANUAL      BLASTS MANUAL      TOTAL CELLS COUNTED [#] IN BLOOD 100     OTHER CELLS MANUAL      NUCLEATED RBC MANUAL      PLASMA CELL %      PLASMA CELL ABSOLUE      PLASMA CELLS MANUAL      HYPOCHROMASIA     POC BLOOD GLUCOSE (RESULTS)   Result Value Ref Range    GLUCOSE, POC 115 (H) 70 - 100 mg/dl   URINALYSIS, MACRO/MICRO   Result Value Ref Range    COLOR Light Yellow Light Yellow, Yellow    APPEARANCE Clear Clear    SPECIFIC GRAVITY <=1.005 1.003 - 1.035    PH 6.0 4.6 - 8.0    LEUKOCYTES Trace (A) Negative WBCs/uL    NITRITE Negative Negative    PROTEIN Negative Negative mg/dL    GLUCOSE Negative Negative mg/dL    KETONES Negative Negative mg/dL    BILIRUBIN Negative Negative mg/dL    BLOOD Trace (A) Negative mg/dL    UROBILINOGEN 0.2 0.2 - 1.0 mg/dL   URINALYSIS,  MICROSCOPIC   Result Value Ref Range    RBCS 0-3 0-3, Not Present /hpf    BACTERIA Few (A) Negative /hpf    WBCS Occasional Not Present, Occasional, 0-5 /hpf    SQUAMOUS EPITHELIAL Few Not Present, Few /hpf   POC BLOOD GLUCOSE (RESULTS)   Result Value Ref Range    GLUCOSE, POC 118 (H) 70 - 100 mg/dl   POC BLOOD GLUCOSE (RESULTS)   Result Value Ref Range    GLUCOSE, POC 117 (H) 70 - 100 mg/dl   POC BLOOD GLUCOSE (RESULTS)   Result Value Ref Range    GLUCOSE, POC 73 70 - 100 mg/dl   POC BLOOD GLUCOSE (RESULTS)   Result Value Ref Range    GLUCOSE, POC 67 (L) 70 - 100 mg/dl   POC BLOOD GLUCOSE (RESULTS)   Result Value Ref Range    GLUCOSE, POC 224 (H) 70 - 100 mg/dl          Physical:  Filed Vitals:    01/08/24 1645 01/08/24 1700 01/08/24 1758 01/08/24 1850   BP: 137/67 (!) 134/59 136/61    Pulse: (!) 46 (!) 47 55 55   Resp:   17    Temp:   36.2 C (97.2 F)    SpO2: 96% 96% 95%       General: Patient is alert and oriented to person, place, and time. No acute distress. Communicates appropriately.   Head: Normocephalic and atraumatic.    Eyes: Pupils equally round and react to light and  accommodate. Extraocular movements intact.  Conjunctiva normal. Sclerae are normal.    Nose: Nasal passages clear. Mucosa moist.    Throat: Moist oral mucosa. No erythema or exudate of the pharynx. Clear oropharynx.    Neck: Supple. No cervical lymphadenopathy or supraclavicular nodes detected. Trachea midline   Heart: Regular rate and rhythm. S1 & S2 present. No S3 or S4. No rubs, gallops, or murmurs appreciated.   Lungs: Clear to auscultation bilaterally with no wheezes or rales. Equal chest excursion.  No conversational dyspnea. No respiratory distress noted.   Abdomen: Soft, nontender, nondistended belly. Bowel sounds are present in all four quadrants. No rigidity.  No guarding.  No ascites.   Extremities:  3+ edema, no cyanosis, or clubbing. Grossly moves all extremities.    Skin: Warm and dry without lesions. No ecchymosis noted.    Neurologic: Cranial nerves II through XII are grossly intact.Strength 5/5 in upper extremities and lower extremities bilaterally.    Genitourinary:  No urinary incontinence or Foley catheter   Psychiatric: Judgment and insight are intact. Mood and affect are appropriate for the situation.       Diagnostic studies:  No results found.       EKG interpretation:     @PEVF @    Assessment:  Assessment & Plan  Hypoglycemia  Patient will be started on D10 at 50 cc an hour.  We will try to minimize the patient's fluid, she does have overload with elevated BNP, and 3+ pitting edema.  She will have regular diet.  All diabetic make medications currently discontinued.  CHF exacerbation  The patient will have Lasix  40 IV b.i.d..  We will monitor her kidney function as well as electrolytes.  Echocardiogram ordered for a.m..  The patient is on looks like small dose of HCTZ only at home at this time.    Plan:  Patient will be admitted for the above problems.  Further interventions will be based on patient's clinical course.  The hospitalist has examined patient, and reviewed all material, and  agrees with the above medical management at this time.      DVT prophylaxis: heparin       Niki Barter, PA-C    [place] [facility] HOSPITALIST         [1] No Known Allergies  [2]   Social History  Tobacco Use    Smoking status: Former     Types: Cigarettes    Smokeless tobacco: Never   Vaping Use    Vaping status: Never Used   Substance Use Topics    Alcohol use: Not Currently    Drug use: Never

## 2024-01-08 NOTE — Care Plan (Signed)
 Problem: Wound  Goal: Skin Health and Integrity  Outcome: Ongoing (see interventions/notes)  Intervention: Optimize Skin Protection  Recent Flowsheet Documentation  Taken 01/08/2024 2012 by Danelle Dunning A, RN  Pressure Reduction Techniques: Frequent weight shifting encouraged  Pressure Reduction Devices: Repositioning wedges/pillows utilized     Problem: Adult Inpatient Plan of Care  Goal: Absence of Hospital-Acquired Illness or Injury  Outcome: Ongoing (see interventions/notes)  Intervention: Identify and Manage Fall Risk  Recent Flowsheet Documentation  Taken 01/08/2024 2012 by Cathaleen Clinton, RN  Safety Promotion/Fall Prevention:   activity supervised   fall prevention program maintained   muscle strengthening facilitated   motion sensor pad activated   nonskid shoes/slippers when out of bed   safety round/check completed  Intervention: Prevent Skin Injury  Recent Flowsheet Documentation  Taken 01/08/2024 2012 by Cathaleen Clinton, RN  Skin Protection:   adhesive use limited   tubing/devices free from skin contact  Taken 01/08/2024 2000 by Cathaleen Clinton, RN  Body Position: supine, head elevated  Intervention: Prevent and Manage VTE (Venous Thromboembolism) Risk  Recent Flowsheet Documentation  Taken 01/08/2024 2012 by Cathaleen Clinton, RN  VTE Prevention/Management:   ambulation promoted   anticoagulant therapy intiated   dorsiflexion/plantar flexion performed  Intervention: Prevent Infection  Recent Flowsheet Documentation  Taken 01/08/2024 2012 by Cathaleen Clinton, RN  Infection Prevention:   glycemic control managed   rest/sleep promoted   promote handwashing     Problem: Comorbidity Management  Goal: Blood Glucose Level Within Target Range  Intervention: Monitor and Manage Glycemia  Recent Flowsheet Documentation  Taken 01/08/2024 2012 by Cathaleen Clinton, RN  Medication Review/Management: medications reviewed  Pt admitted for Hypoglycemia. ACHS Q1HR. Continuous pulse ox and cardiac monitoring in use. Labs and vitals being monitored per physician  orders. Pt is receiving DW10 @ 50 mL/hr. POC reviewed with pt and family. Pt agrees to POC including medications and monitoring. Pt denies further needs.

## 2024-01-09 ENCOUNTER — Inpatient Hospital Stay (HOSPITAL_COMMUNITY)

## 2024-01-09 DIAGNOSIS — N184 Chronic kidney disease, stage 4 (severe): Secondary | ICD-10-CM

## 2024-01-09 DIAGNOSIS — I130 Hypertensive heart and chronic kidney disease with heart failure and stage 1 through stage 4 chronic kidney disease, or unspecified chronic kidney disease: Secondary | ICD-10-CM

## 2024-01-09 LAB — CBC WITH DIFF
BASOPHIL #: 0 10*3/uL (ref 0.00–0.10)
BASOPHIL %: 0 % (ref 0–1)
EOSINOPHIL #: 0.1 10*3/uL (ref 0.00–0.50)
EOSINOPHIL %: 1 % (ref 1–7)
HCT: 33.4 % (ref 31.2–41.9)
HGB: 11.1 g/dL (ref 10.9–14.3)
LYMPHOCYTE #: 0.5 10*3/uL — ABNORMAL LOW (ref 1.10–3.10)
LYMPHOCYTE %: 9 % — ABNORMAL LOW (ref 16–46)
MCH: 28.9 pg (ref 24.7–32.8)
MCHC: 33.3 g/dL (ref 32.3–35.6)
MCV: 86.6 fL (ref 75.5–95.3)
MONOCYTE #: 0.5 10*3/uL (ref 0.20–0.90)
MONOCYTE %: 10 % (ref 4–11)
MPV: 10.5 fL (ref 7.9–10.8)
NEUTROPHIL #: 4.4 10*3/uL (ref 1.90–8.20)
NEUTROPHIL %: 80 % — ABNORMAL HIGH (ref 43–77)
PLATELETS: 139 10*3/uL — ABNORMAL LOW (ref 140–440)
RBC: 3.85 10*6/uL (ref 3.63–4.92)
RDW: 16.7 % (ref 12.3–17.7)
WBC: 5.5 10*3/uL (ref 3.8–11.8)

## 2024-01-09 LAB — BASIC METABOLIC PANEL
ANION GAP: 7 mmol/L (ref 4–13)
BUN/CREA RATIO: 13 (ref 6–22)
BUN: 22 mg/dL (ref 7–25)
CALCIUM: 8.5 mg/dL — ABNORMAL LOW (ref 8.6–10.3)
CHLORIDE: 106 mmol/L (ref 98–107)
CO2 TOTAL: 28 mmol/L (ref 21–31)
CREATININE: 1.71 mg/dL — ABNORMAL HIGH (ref 0.60–1.30)
ESTIMATED GFR: 29 mL/min/{1.73_m2} — ABNORMAL LOW (ref >59)
GLUCOSE: 125 mg/dL — ABNORMAL HIGH (ref 74–109)
OSMOLALITY, CALCULATED: 286 mosm/kg (ref 270–290)
POTASSIUM: 4.4 mmol/L (ref 3.5–5.1)
SODIUM: 141 mmol/L (ref 136–145)

## 2024-01-09 LAB — POC BLOOD GLUCOSE (RESULTS)
GLUCOSE, POC: 106 mg/dL — ABNORMAL HIGH (ref 70–100)
GLUCOSE, POC: 113 mg/dL — ABNORMAL HIGH (ref 70–100)
GLUCOSE, POC: 123 mg/dL — ABNORMAL HIGH (ref 70–100)
GLUCOSE, POC: 123 mg/dL — ABNORMAL HIGH (ref 70–100)
GLUCOSE, POC: 124 mg/dL — ABNORMAL HIGH (ref 70–100)
GLUCOSE, POC: 129 mg/dL — ABNORMAL HIGH (ref 70–100)
GLUCOSE, POC: 130 mg/dL — ABNORMAL HIGH (ref 70–100)
GLUCOSE, POC: 131 mg/dL — ABNORMAL HIGH (ref 70–100)
GLUCOSE, POC: 138 mg/dL — ABNORMAL HIGH (ref 70–100)
GLUCOSE, POC: 141 mg/dL — ABNORMAL HIGH (ref 70–100)
GLUCOSE, POC: 146 mg/dL — ABNORMAL HIGH (ref 70–100)
GLUCOSE, POC: 150 mg/dL — ABNORMAL HIGH (ref 70–100)
GLUCOSE, POC: 164 mg/dL — ABNORMAL HIGH (ref 70–100)
GLUCOSE, POC: 84 mg/dL (ref 70–100)
GLUCOSE, POC: 90 mg/dL (ref 70–100)

## 2024-01-09 LAB — MAGNESIUM: MAGNESIUM: 1.8 mg/dL — ABNORMAL LOW (ref 1.9–2.7)

## 2024-01-09 NOTE — Progress Notes (Signed)
 [place] MEDICINE [facility]    HOSPITALIST PROGRESS NOTE    Connie  JAZMAN Barber  Date of service: 01/09/2024  Date of Admission:  01/08/2024  Hospital Day:  LOS: 1 day     Subjective:   Patient was admitted on 01/08/2024 with hypoglycemia.  She does admit to not eating and drinking well.  D10 drip was started to maintain blood sugar.  She also reports worsening lower extremity edema, this is chronic.  She does not use oxygen at home but was hypoxic in the ER and placed on 2 L oxygen.  She does not take insulin at home., She also states that she did not take any of her diabetic medications at home except for 1 dose of Januvia the day prior.    Vital Signs:  Filed Vitals:    01/09/24 0700 01/09/24 0932 01/09/24 1149 01/09/24 1500   BP: 125/63  134/60 (!) 148/74   Pulse: 51 53 (!) 45 54   Resp: 16  16 17    Temp: 36.4 C (97.5 F)  36.4 C (97.5 F) (!) 35.9 C (96.6 F)   SpO2: 99%  100% 92%            Physical Exam  Cardiovascular:      Rate and Rhythm: Normal rate and regular rhythm.      Pulses: Normal pulses.      Heart sounds: Normal heart sounds.   Pulmonary:      Breath sounds: Normal breath sounds.   Abdominal:      General: Bowel sounds are normal.      Palpations: Abdomen is soft.      Tenderness: There is no abdominal tenderness.   Musculoskeletal:      Cervical back: Normal range of motion and neck supple.      Right lower leg: Edema present.      Left lower leg: Edema present.   Skin:     General: Skin is warm and dry.      Capillary Refill: Capillary refill takes less than 2 seconds.   Neurological:      General: No focal deficit present.      Mental Status: She is alert and oriented to person, place, and time.   Psychiatric:         Behavior: Behavior normal.            Intake & Output:    Intake/Output Summary (Last 24 hours) at 01/09/2024 1911  Last data filed at 01/09/2024 1300  Gross per 24 hour   Intake 480 ml   Output --   Net 480 ml     I/O current shift:  No intake/output data  recorded.  Emesis:    BM:    Date of Last Bowel Movement: 01/07/24  Heme:      D10W premixed infusion, , Intravenous, Continuous  dextrose  (GLUTOSE) 40% oral gel, 15 g, Oral, Q15 Min PRN  dextrose  50% (0.5 g/mL) injection - syringe, 12.5 g, Intravenous, Q15 Min PRN  dextrose  50% (0.5 g/mL) injection - syringe, 12.5 g, Intravenous, Now  furosemide  (LASIX ) 10 mg/mL injection, 40 mg, Intravenous, Q12H  glucagon injection 1 mg, 1 mg, IntraMUSCULAR, Once PRN  heparin  5,000 unit/mL injection, 5,000 Units, Subcutaneous, Q8HRS          Labs:  Recent Results (from the past 48 hours)   CBC WITH DIFF    Collection Time: 01/09/24  5:20 AM   Result Value    WBC 5.5  HGB 11.1    HCT 33.4    PLATELETS 139 (L)      Results for orders placed or performed during the hospital encounter of 01/08/24 (from the past 48 hours)   BASIC METABOLIC PANEL    Collection Time: 01/09/24  5:20 AM   Result Value    SODIUM 141    POTASSIUM 4.4    CHLORIDE 106    CO2 TOTAL 28    GLUCOSE 125 (H)    BUN 22    CREATININE 1.71 (H)      Recent Results (from the past 48 hours)   COMPREHENSIVE METABOLIC PANEL, NON-FASTING    Collection Time: 01/08/24  7:41 PM   Result Value    ALKALINE PHOSPHATASE 83    ALT (SGPT) 17    AST (SGOT) 20   LIPASE    Collection Time: 01/08/24  7:41 PM   Result Value    LIPASE 10 (L)   COMPREHENSIVE METABOLIC PANEL, NON-FASTING    Collection Time: 01/08/24 12:44 PM   Result Value    ALKALINE PHOSPHATASE 110    ALT (SGPT) 28    AST (SGOT) 26      Results for orders placed or performed during the hospital encounter of 01/08/24 (from the past 48 hours)   TROPONIN-I NOW    Collection Time: 01/08/24 12:44 PM   Result Value    TROPONIN I 12      Recent Results (from the past 48 hours)   PTT (PARTIAL THROMBOPLASTIN TIME)    Collection Time: 01/08/24 12:44 PM   Result Value    APTT 28.3   PT/INR    Collection Time: 01/08/24 12:44 PM   Result Value    PROTHROMBIN TIME 12.5    INR 1.11 (H)      No results found for this or any previous  visit (from the past 8 weeks).   No results found for this or any previous visit (from the past 48 hours).     Microbiology:  Hospital Encounter on 01/08/24 (from the past 96 hours)   URINE CULTURE,ROUTINE    Collection Time: 01/08/24  2:10 PM    Specimen: Urine, Site not specified   Culture Result Status    URINE CULTURE No Growth Preliminary       Imaging:   XR AP MOBILE CHEST  Narrative: Connie Barber    RADIOLOGIST: Penney Bowling, MD    XR AP MOBILE CHEST performed on 01/08/2024 2:24 PM    CLINICAL HISTORY: CHF  HYPOGLYCEMIA, CHF    TECHNIQUE: Frontal view of the chest.    COMPARISON:  11/25/2022    FINDINGS:    Heart size is mildly enlarged.     There is a linear density in the left midlung unchanged from 11/25/2022 likely chronic atelectasis or scarring. No acute findings.   Impression: NO ACUTE FINDINGS.    Radiologist location ID: [id number]  ECG 12 LEAD  Sinus bradycardia  Moderate voltage criteria for LVH, may be normal variant ( R in aVL , Cornell product )  ST & T wave abnormality, consider inferior ischemia  Abnormal ECG  When compared with ECG of 04-Dec-2023 20:43,  Vent. rate has decreased BY  30 BPM  Non-specific change in ST segment in Anterior leads  T wave inversion no longer evident in Anterolateral leads      Assessment/ Plan:   Active Hospital Problems   (*Primary Problem)    Diagnosis    Hypoglycemia  CHF exacerbation    Secondary hyperparathyroidism of renal origin (CMS HCC)    Diabetes mellitus    Essential hypertension    Chronic kidney disease, stage 4 (severe) (CMS HCC)     Noted by Jodelle Mungo DO  last documented on [id number]         Nutrition:    DIET REGULAR Do you want to initiate MNT Protocol? Yes; Additional modifications/limitations: NO ADDED SALT    Additional clinical characteristics related to nutrition:    - monitor for weight changes   - monitor intake and output    - monitor bowel functions      Disposition Planning:     Hypoglycemia  Chronic lower extremity  edema  Diabetes mellitus  Essential hypertension  Chronic kidney disease stage 4    -continue D10 drip and titrate  -blood sugars q.3 hours  -encourage p.o. intake  -restart home medicines as appropriate, hold all diabetic medications  -IV Lasix  40 mg q.12  -palliative care consult  -echo ordered    DVT px: heparin  tid    The Hospitalist personally evaluated and examined the patient in conjunction with the MLP and agree with the assessments, treatment plan and disposition of the patient as recorded by the [facility].   Barb Bonito, NP-C  01/09/2024  [facility] MEDICINE HOSPITALIST

## 2024-01-09 NOTE — Nurses Notes (Signed)
 Documentation and assessments were performed by Orien Bird, RN and were reviewed by this RN.     Wally Gunnels, RN

## 2024-01-10 DIAGNOSIS — I272 Pulmonary hypertension, unspecified: Secondary | ICD-10-CM

## 2024-01-10 LAB — CBC WITH DIFF
BASOPHIL #: 0 10*3/uL (ref 0.00–0.10)
BASOPHIL %: 0 % (ref 0–1)
EOSINOPHIL #: 0.1 10*3/uL (ref 0.00–0.50)
EOSINOPHIL %: 2 % (ref 1–7)
HCT: 37.2 % (ref 31.2–41.9)
HGB: 12 g/dL (ref 10.9–14.3)
LYMPHOCYTE #: 1.3 10*3/uL (ref 1.10–3.10)
LYMPHOCYTE %: 35 % (ref 16–46)
MCH: 28.5 pg (ref 24.7–32.8)
MCHC: 32.4 g/dL (ref 32.3–35.6)
MCV: 88.1 fL (ref 75.5–95.3)
MONOCYTE #: 0.5 10*3/uL (ref 0.20–0.90)
MONOCYTE %: 13 % — ABNORMAL HIGH (ref 4–11)
MPV: 10.8 fL (ref 7.9–10.8)
NEUTROPHIL #: 1.8 10*3/uL — ABNORMAL LOW (ref 1.90–8.20)
NEUTROPHIL %: 50 % (ref 43–77)
PLATELETS: 143 10*3/uL (ref 140–440)
RBC: 4.22 10*6/uL (ref 3.63–4.92)
RDW: 16.4 % (ref 12.3–17.7)
WBC: 3.6 10*3/uL — ABNORMAL LOW (ref 3.8–11.8)

## 2024-01-10 LAB — COMPREHENSIVE METABOLIC PANEL, NON-FASTING
ALBUMIN/GLOBULIN RATIO: 1.1 (ref 0.8–1.4)
ALBUMIN: 3.5 g/dL (ref 3.5–5.7)
ALKALINE PHOSPHATASE: 76 U/L (ref 34–104)
ALT (SGPT): 13 U/L (ref 7–52)
ANION GAP: 4 mmol/L (ref 4–13)
AST (SGOT): 13 U/L (ref 13–39)
BILIRUBIN TOTAL: 0.5 mg/dL (ref 0.3–1.0)
BUN/CREA RATIO: 14 (ref 6–22)
BUN: 24 mg/dL (ref 7–25)
CALCIUM, CORRECTED: 9.3 mg/dL (ref 8.9–10.8)
CALCIUM: 8.9 mg/dL (ref 8.6–10.3)
CHLORIDE: 100 mmol/L (ref 98–107)
CO2 TOTAL: 36 mmol/L — ABNORMAL HIGH (ref 21–31)
CREATININE: 1.73 mg/dL — ABNORMAL HIGH (ref 0.60–1.30)
ESTIMATED GFR: 28 mL/min/{1.73_m2} — ABNORMAL LOW (ref >59)
GLOBULIN: 3.2 (ref 2.0–3.5)
GLUCOSE: 203 mg/dL — ABNORMAL HIGH (ref 74–109)
OSMOLALITY, CALCULATED: 289 mosm/kg (ref 270–290)
POTASSIUM: 4.5 mmol/L (ref 3.5–5.1)
PROTEIN TOTAL: 6.7 g/dL (ref 6.4–8.9)
SODIUM: 140 mmol/L (ref 136–145)

## 2024-01-10 LAB — POC BLOOD GLUCOSE (RESULTS)
GLUCOSE, POC: 129 mg/dL — ABNORMAL HIGH (ref 70–100)
GLUCOSE, POC: 137 mg/dL — ABNORMAL HIGH (ref 70–100)
GLUCOSE, POC: 141 mg/dL — ABNORMAL HIGH (ref 70–100)
GLUCOSE, POC: 143 mg/dL — ABNORMAL HIGH (ref 70–100)
GLUCOSE, POC: 169 mg/dL — ABNORMAL HIGH (ref 70–100)
GLUCOSE, POC: 83 mg/dL (ref 70–100)
GLUCOSE, POC: 83 mg/dL (ref 70–100)
GLUCOSE, POC: 91 mg/dL (ref 70–100)
GLUCOSE, POC: 96 mg/dL (ref 70–100)
GLUCOSE, POC: 96 mg/dL (ref 70–100)

## 2024-01-10 LAB — TRANSTHORACIC ECHOCARDIOGRAM - ADULT
EF VISUAL ESTIMATE: 50
EF: 55

## 2024-01-10 LAB — URINE CULTURE,ROUTINE: URINE CULTURE: NO GROWTH — AB

## 2024-01-10 NOTE — Progress Notes (Signed)
 [place] [facility]    HOSPITALIST PROGRESS NOTE    Shalin  Elvie Hammed  Date of service: 01/10/2024  Date of Admission:  01/08/2024  Hospital Day:  LOS: 2 days     Subjective:   Patient was admitted on 01/08/2024 with hypoglycemia.  She does admit to not eating and drinking well.  D10 drip was started to maintain blood sugar.  She also reports worsening lower extremity edema, this is chronic.  She does not use oxygen at home but was hypoxic in the ER and placed on 2 L oxygen.  She does not take insulin at home., She also states that she did not take any of her diabetic medications at home except for 1 dose of Januvia the day prior.    01/10/24- D10 drip is currently on 25 mL/hour, blood sugar is still only in the 90s and patient is eating well.  C-peptide and serum insulin ordered.    Vital Signs:  Filed Vitals:    01/10/24 0734 01/10/24 0952 01/10/24 1148 01/10/24 1218   BP: 134/61  (!) 134/58    Pulse: (!) 46 (!) 45 (!) 47 (!) 39   Resp: 16  18    Temp: 36.2 C (97.1 F)  36.4 C (97.5 F)    SpO2: 93%  96%             Physical Exam  Cardiovascular:      Rate and Rhythm: Normal rate and regular rhythm.      Pulses: Normal pulses.      Heart sounds: Normal heart sounds.   Pulmonary:      Breath sounds: Normal breath sounds.   Abdominal:      General: Bowel sounds are normal.      Palpations: Abdomen is soft.      Tenderness: There is no abdominal tenderness.   Musculoskeletal:      Cervical back: Normal range of motion and neck supple.      Right lower leg: Edema present.      Left lower leg: Edema present.   Skin:     General: Skin is warm and dry.      Capillary Refill: Capillary refill takes less than 2 seconds.   Neurological:      General: No focal deficit present.      Mental Status: She is alert and oriented to person, place, and time.   Psychiatric:         Behavior: Behavior normal.            Intake & Output:    Intake/Output Summary (Last 24 hours) at 01/10/2024 1531  Last data filed  at 01/10/2024 1300  Gross per 24 hour   Intake 720 ml   Output --   Net 720 ml     I/O current shift:  06/15 0700 - 06/15 1859  In: 480 [P.O.:480]  Out: -   Emesis:    BM:    Date of Last Bowel Movement: 01/07/24  Heme:      D10W premixed infusion, , Intravenous, Continuous  dextrose  (GLUTOSE) 40% oral gel, 15 g, Oral, Q15 Min PRN  dextrose  50% (0.5 g/mL) injection - syringe, 12.5 g, Intravenous, Q15 Min PRN  dextrose  50% (0.5 g/mL) injection - syringe, 12.5 g, Intravenous, Now  furosemide  (LASIX ) 10 mg/mL injection, 40 mg, Intravenous, Q12H  glucagon injection 1 mg, 1 mg, IntraMUSCULAR, Once PRN  heparin  5,000 unit/mL injection, 5,000 Units, Subcutaneous, Q8HRS  Labs:  Recent Results (from the past 48 hours)   CBC WITH DIFF    Collection Time: 01/10/24  9:25 AM   Result Value    WBC 3.6 (L)    HGB 12.0    HCT 37.2    PLATELETS 143      Results for orders placed or performed during the hospital encounter of 01/08/24 (from the past 48 hours)   BASIC METABOLIC PANEL    Collection Time: 01/09/24  5:20 AM   Result Value    SODIUM 141    POTASSIUM 4.4    CHLORIDE 106    CO2 TOTAL 28    GLUCOSE 125 (H)    BUN 22    CREATININE 1.71 (H)      Recent Results (from the past 48 hours)   COMPREHENSIVE METABOLIC PANEL, NON-FASTING    Collection Time: 01/10/24  9:25 AM   Result Value    ALKALINE PHOSPHATASE 76    ALT (SGPT) 13    AST (SGOT) 13   COMPREHENSIVE METABOLIC PANEL, NON-FASTING    Collection Time: 01/08/24  7:41 PM   Result Value    ALKALINE PHOSPHATASE 83    ALT (SGPT) 17    AST (SGOT) 20   LIPASE    Collection Time: 01/08/24  7:41 PM   Result Value    LIPASE 10 (L)      No results found for this or any previous visit (from the past 48 hours).     No results found for this or any previous visit (from the past 48 hours).     No results found for this or any previous visit (from the past 8 weeks).   No results found for this or any previous visit (from the past 48 hours).     Microbiology:  Hospital Encounter on  01/08/24 (from the past 96 hours)   URINE CULTURE,ROUTINE    Collection Time: 01/08/24  2:10 PM    Specimen: Urine, Site not specified   Culture Result Status    URINE CULTURE 20,000 CFU/mL Mixed Flora (A) Final    Narrative    These results may or may not reflect clinically significant isolates because there is contamination with the usual skin flora that could interfere  with the growth of potential pathogens.         Imaging:   TRANSTHORACIC ECHOCARDIOGRAM - ADULT  **See full report in linked PDF document**                                                                                                                                                            Version: 1                                                                +--------------------------------------+                                                              :                                      :                                                              :                                      :                                                              +--------------------------------------+  [address]                                                                     Transthoracic Echocardiographic Report    ______________________________________________________________________________  Name: Connie Barber, Connie Barber                       MRN: [id number]                                   Weight: 175.003 lb  Study Date: 01/09/2024, 11: 11 AM                  DOB: 12/01/1934                                 Height: 63 in  Gender: Female                                                                                     BSA: 1.83 m2  Patient Location: PRN NON INVASIVE CARD PRN  Referring Physician: Niki Barter  Ordering Physician: Niki Barter  Tech: Dorna Gasman  ______________________________________________________________________________  Technical Quality:: The study images were of technically adequate quality.  Quality: The study images were of technically adequate quality.    Reason For Study: Heart failure    Conclusions  Normal left ventricular size.  Left ventricular systolic function is normal.  The left ventricular ejection fraction by visual assessment is estimated to be 50-55%.  Left ventricular diastolic parameters are normal.  Moderately dilated left atrium.  There is moderate mitral regurgitation.  There is moderate tricuspid regurgitation.  RV systolic pressure is consistent with moderate pulmonary hypertension.    Findings:  Procedure: Transthoracic complete echo, 2D, spectral and tissue Doppler, color flow Doppler, M-mode.  Left Ventricle: Normal left ventricular size. Left ventricular systolic function is normal. The left ventricular ejection fraction by visual assessment is estimated to be 50-55%. Left ventricular  diastolic parameters are normal.  Right Ventricle: RV systolic pressure is consistent with moderate pulmonary hypertension.  Left Atrium: Moderately dilated left atrium.  Mitral valve: Mitral valve leaflets appear mildly thickened. There is moderate mitral regurgitation.  Tricuspid valve: The tricuspid valve is normal. There is moderate tricuspid regurgitation.  Aortic valve: The aortic cusps appear mildly thickened.  Pulmonic valve: The pulmonic valve is normal.    2D/ M Mode  Doppler  LVIDd: F: (3.8-5.2)/ M: (4.2-5.8)              AV Peak Vel: 135.7 cm/sec               (100-170  LVIDs: F: (2.2-3.5/ M: 2.5-4.0)                                                       (70-90)  IVSd: F: (0.6-0.9)/ M: (0.6-1.0               AV max PG: 7.4 mmHg                    (2.0-9.0)  IVSs: 1.61 cm                                                                     AV Mean PG: 4.6  mmHg                   (2.0-4.0  LVPWd: F: (0.6-0.9)/ M: (0.6-1.0)              AVA Vmax): 1.72 cm2                                                                                      AVA VTI: 1.79 cm2                                                                                      AV DI (VTI): 0.46                                                                                      AV DI (vel): 0.45                                              F: (67-162)/ M: (16-109)  F: (43-95)/ M: (49-115)                 LVOT diam: 2.22 cm                                                                                      LVOT Peak Vel: 60.4 cm/sec  LA dimension: 4.8 cm                      F: (2.7-3.8)/ M: (3.0-4.0)                                              F: (1.5-2.3)/ M: (1.5-2.3)              LVOT Peak PG: 1.46 mmHg                                              F: (8-24)/ M: (11-31)                   LVOT Mean PG: 0.78 mmHg                                              F: (1.5-2.3)/ M: (1.5-2.3)              LVOT VTI: 19.4 cm                                              F: (8-24)/ M: (11-31)                                              F: (15-27)/ M: (18-32)                  AV VTI: 41.8 cm  RVd_basal: 1.54 cm                                                                AI max PG: 45.0 mmHg  AI PHT: 776.3 msec                                              F: (8-20)/ M: (10-24)                                              F: (4.5-11)/ M: (5-12.6)                AV VR: 0.44                                                                                      AI Vmax: 333.2 cm/sec                                              F: (1.6-6.4)/ M: (2.0-7.4)              AI dec slope: 125.7 cm/sec2                                              42-56                                   SV(LVOT): 74.8  ml                                              F: (32-74)/ M: (36-87)                   SI(LVOT): 41.0 ml/m2                                              F: (8-36)/ M: (10-44)                                                                                      CO LVOT: 3.5 l/min  TAPSE: 1.87 cm                            20.5-27.5  F: (46-106)/ M: (62-150)                                              F: (14-42)/ M: (21-61)                  MV E Peak Vel: 82.7 cm/sec                                                                                      MV A Peak Vel: 47.1 cm/sec  EDV (MOD-bp): 76.8 ml                                                            MV Decel time: 0.49 sec  ESV (MOD-bp): 33.7 ml  EF (MOD-bp): 56.1 %                      F: (54-74)/ M: (52-72)  EDV(MOD-sp4): 78.5 ml                                                            Lat Peak E' Vel: 8.4 cm/sec  EDV(MOD-sp2): 73.0 ml                                                            Lat E/E': 9.9  ESV(MOD-sp2): 28.8 ml                                                            Med Peak E' Vel: 6.3 cm/sec  ESV(MOD-sp4): 38.3 ml                                                            Med E/E': 13.1  MV V max: 79.7 cm/sec                                                                                      MV Peak Vel: 81.8 cm/sec                                              F:: (2.7-3.3)/ M: (3.1-3.7)                                              F: (2.3-2.9)/ M: (2.6-3.2)              MV VTI: 32.3 cm                                              F: (2.3-2.9)/ M: (2.6-3.2)              MVA (VTI): 2.32 cm2                                                                                      MV V max: 79.7 cm/sec                                                                                      MR Peak Vel: 346.1  cm/sec                                                                                      MR Peak PG: 47.9 mmHg  HR.: 47.0 BPM                                                                                      PV Peak Vel: 82.8 cm/sec                                                                                      TV S' Vel: 12.5 cm/sec                                                                                      TR Vmax: 308.1 cm/sec                                                                                      TR Peak PG: 38.0 mmHg                                                                                      RAP systole: 14.0 mmHg                                                                                      RVSP: 52.0 mmHg  TV Peak Vel: 55.3 cm/sec                                                                                      TV V mean: 33.1 cm/sec                                                                                      TV Peak PG: 1.22 mmHg                                                                                      TV Mean PG: 0.49 mmHg                                                                                      TV VTI: 26.3 cm    ______________________________________________________________________________  Electronically signed by: Webb Hake  01/10/2024, 7: 32 AM      Assessment/ Plan:   Active Hospital Problems   (*Primary Problem)    Diagnosis    Hypoglycemia    CHF exacerbation    Secondary hyperparathyroidism of renal origin (CMS HCC)    Diabetes mellitus    Essential hypertension    Chronic kidney disease, stage 4 (severe) (CMS HCC)     Noted by PETRARCA ROBERT DO  last documented on [id number]         Nutrition:    DIET REGULAR Do you want to initiate MNT Protocol? Yes;  Additional modifications/limitations: NO ADDED SALT    Additional clinical characteristics related to nutrition:    - monitor for weight changes   - monitor intake and output    - monitor bowel functions      Disposition Planning:     Hypoglycemia  Chronic lower extremity edema  Diabetes mellitus  Essential hypertension  Chronic kidney disease stage 4    -continue D10 drip and titrate  -C-peptide and serum insulin pending  -blood sugars q.2 hours  -encourage p.o. intake  -restart home medicines as appropriate, hold all diabetic medications  -IV Lasix  40 mg q.12  -palliative care and endocrinologist consult  -echo showed moderately dilated left atrium, moderate MR and TR and moderate pulmonary  hypertension.  EF 50%    DVT px: heparin  tid    The Hospitalist personally evaluated and examined the patient in conjunction with the MLP and agree with the assessments, treatment plan and disposition of the patient as recorded by the [facility].   Barb Bonito, NP-C  01/10/2024  [place] [facility] HOSPITALIST    Plan of care to be adjusted depending on response to current treatment, further testing and evaluation.      Americo Justice MD.  [facility] MEDICINE HOSPITALIST

## 2024-01-10 NOTE — Nurses Notes (Signed)
 Documentation and assessments were performed by Orien Bird, RN and were reviewed by this RN.     Wally Gunnels, RN

## 2024-01-10 NOTE — Care Plan (Signed)
 Pt is A&Ox4. Pt blood sugar was 83 at 0700 and 137 at 1000 and 143 at 1300. D10W going at 1ml/hr. Pt given 40 mg of Lasix  IV per order. Patient ambulated to the bathroom with standby assist. MD looking into labs that pertain to pancreas function. Pt family educated on labs ordered by MD.             Problem: Wound  Goal: Optimal Coping  Outcome: Ongoing (see interventions/notes)  Intervention: Support Patient and Family Response  Recent Flowsheet Documentation  Taken 01/10/2024 0800 by Nehemiah Balling, GN  Supportive Measures: active listening utilized  Goal: Optimal Functional Ability  Outcome: Ongoing (see interventions/notes)  Goal: Absence of Infection Signs and Symptoms  Outcome: Ongoing (see interventions/notes)  Intervention: Prevent or Manage Infection  Recent Flowsheet Documentation  Taken 01/10/2024 0800 by Boyd Cabal L, GN  Fever Reduction/Comfort Measures:   lightweight bedding   lightweight clothing  Goal: Improved Oral Intake  Outcome: Ongoing (see interventions/notes)  Goal: Optimal Pain Control and Function  Outcome: Ongoing (see interventions/notes)  Intervention: Prevent or Manage Pain  Recent Flowsheet Documentation  Taken 01/10/2024 0800 by Nehemiah Balling, GN  Sleep/Rest Enhancement: awakenings minimized  Goal: Skin Health and Integrity  Outcome: Ongoing (see interventions/notes)  Intervention: Optimize Skin Protection  Recent Flowsheet Documentation  Taken 01/10/2024 0800 by Boyd Cabal L, GN  Pressure Reduction Techniques: Patient turned q 2 hours  Pressure Reduction Devices: Repositioning wedges/pillows utilized  Goal: Optimal Wound Healing  Outcome: Ongoing (see interventions/notes)  Intervention: Promote Wound Healing  Recent Flowsheet Documentation  Taken 01/10/2024 0800 by Boyd Cabal L, GN  Pressure Reduction Techniques: Patient turned q 2 hours  Pressure Reduction Devices: Repositioning wedges/pillows utilized  Sleep/Rest Enhancement: awakenings minimized     Problem: Adult Inpatient Plan of Care  Goal: Plan of Care  Review  Outcome: Ongoing (see interventions/notes)  Goal: Patient-Specific Goal (Individualized)  Outcome: Ongoing (see interventions/notes)  Flowsheets (Taken 01/10/2024 0800)  Individualized Care Needs: monitor labs and VS, accuchecks q3hr, fall prevention, assist with ADL's  Anxieties, Fears or Concerns: none voiced  Goal: Absence of Hospital-Acquired Illness or Injury  Outcome: Ongoing (see interventions/notes)  Intervention: Identify and Manage Fall Risk  Recent Flowsheet Documentation  Taken 01/10/2024 0800 by Nehemiah Balling, GN  Safety Promotion/Fall Prevention:   activity supervised   safety round/check completed   nonskid shoes/slippers when out of bed  Intervention: Prevent Skin Injury  Recent Flowsheet Documentation  Taken 01/10/2024 0800 by Nehemiah Balling, GN  Skin Protection:   adhesive use limited   tubing/devices free from skin contact  Intervention: Prevent and Manage VTE (Venous Thromboembolism) Risk  Recent Flowsheet Documentation  Taken 01/10/2024 0800 by Nehemiah Balling, GN  VTE Prevention/Management: ambulation promoted  Intervention: Prevent Infection  Recent Flowsheet Documentation  Taken 01/10/2024 0800 by Boyd Cabal L, GN  Infection Prevention:   promote handwashing   rest/sleep promoted  Goal: Optimal Comfort and Wellbeing  Outcome: Ongoing (see interventions/notes)  Intervention: Provide Person-Centered Care  Recent Flowsheet Documentation  Taken 01/10/2024 0800 by Nehemiah Balling, GN  Trust Relationship/Rapport: care explained  Goal: Rounds/Family Conference  Outcome: Ongoing (see interventions/notes)     Problem: Skin Injury Risk Increased  Goal: Skin Health and Integrity  Outcome: Ongoing (see interventions/notes)  Intervention: Optimize Skin Protection  Recent Flowsheet Documentation  Taken 01/10/2024 0800 by Boyd Cabal L, GN  Pressure Reduction Techniques: Patient turned q 2 hours  Pressure Reduction Devices: Repositioning wedges/pillows utilized  Skin Protection:   adhesive use limited   tubing/devices free from skin  contact     Problem: Comorbidity Management  Goal: Blood Glucose Level Within Target Range  Outcome: Ongoing (see interventions/notes)  Intervention: Monitor and Manage Glycemia  Recent Flowsheet Documentation  Taken 01/10/2024 0800 by Nehemiah Balling, GN  Medication Review/Management: medications reviewed

## 2024-01-11 DIAGNOSIS — N184 Chronic kidney disease, stage 4 (severe): Secondary | ICD-10-CM

## 2024-01-11 DIAGNOSIS — R9431 Abnormal electrocardiogram [ECG] [EKG]: Secondary | ICD-10-CM

## 2024-01-11 DIAGNOSIS — R600 Localized edema: Secondary | ICD-10-CM

## 2024-01-11 DIAGNOSIS — E11649 Type 2 diabetes mellitus with hypoglycemia without coma: Secondary | ICD-10-CM

## 2024-01-11 LAB — COMPREHENSIVE METABOLIC PANEL, NON-FASTING
ALBUMIN/GLOBULIN RATIO: 1.1 (ref 0.8–1.4)
ALBUMIN: 3.5 g/dL (ref 3.5–5.7)
ALKALINE PHOSPHATASE: 80 U/L (ref 34–104)
ALT (SGPT): 13 U/L (ref 7–52)
ANION GAP: 8 mmol/L (ref 4–13)
AST (SGOT): 13 U/L (ref 13–39)
BILIRUBIN TOTAL: 0.5 mg/dL (ref 0.3–1.0)
BUN/CREA RATIO: 19 (ref 6–22)
BUN: 33 mg/dL — ABNORMAL HIGH (ref 7–25)
CALCIUM, CORRECTED: 9.8 mg/dL (ref 8.9–10.8)
CALCIUM: 9.4 mg/dL (ref 8.6–10.3)
CHLORIDE: 95 mmol/L — ABNORMAL LOW (ref 98–107)
CO2 TOTAL: 38 mmol/L — ABNORMAL HIGH (ref 21–31)
CREATININE: 1.72 mg/dL — ABNORMAL HIGH (ref 0.60–1.30)
ESTIMATED GFR: 29 mL/min/{1.73_m2} — ABNORMAL LOW (ref >59)
GLOBULIN: 3.2 (ref 2.0–3.5)
GLUCOSE: 113 mg/dL — ABNORMAL HIGH (ref 74–109)
OSMOLALITY, CALCULATED: 289 mosm/kg (ref 270–290)
POTASSIUM: 5.1 mmol/L (ref 3.5–5.1)
PROTEIN TOTAL: 6.7 g/dL (ref 6.4–8.9)
SODIUM: 141 mmol/L (ref 136–145)

## 2024-01-11 LAB — CORTISOL, PLASMA OR SERUM: CORTISOL: 11.7 ug/dL (ref 6.7–22.6)

## 2024-01-11 LAB — POC BLOOD GLUCOSE (RESULTS)
GLUCOSE, POC: 117 mg/dL — ABNORMAL HIGH (ref 70–100)
GLUCOSE, POC: 105 mg/dL — ABNORMAL HIGH (ref 70–100)
GLUCOSE, POC: 105 mg/dL — ABNORMAL HIGH (ref 70–100)
GLUCOSE, POC: 100 mg/dL (ref 70–100)
GLUCOSE, POC: 193 mg/dL — ABNORMAL HIGH (ref 70–100)
GLUCOSE, POC: 176 mg/dL — ABNORMAL HIGH (ref 70–100)
GLUCOSE, POC: 166 mg/dL — ABNORMAL HIGH (ref 70–100)
GLUCOSE, POC: 276 mg/dL — ABNORMAL HIGH (ref 70–100)
GLUCOSE, POC: 212 mg/dL — ABNORMAL HIGH (ref 70–100)
GLUCOSE, POC: 185 mg/dL — ABNORMAL HIGH (ref 70–100)

## 2024-01-11 LAB — ECG 12 LEAD
Atrial Rate: 47 {beats}/min
Calculated P Axis: 63 degrees
Calculated R Axis: -24 degrees
Calculated T Axis: -53 degrees
PR Interval: 192 ms
QRS Duration: 94 ms
QT Interval: 478 ms
QTC Calculation: 423 ms
Ventricular rate: 47 {beats}/min

## 2024-01-11 LAB — CBC WITH DIFF
BASOPHIL #: 0 10*3/uL (ref 0.00–0.10)
BASOPHIL %: 1 % (ref 0–1)
EOSINOPHIL #: 0.1 10*3/uL (ref 0.00–0.50)
EOSINOPHIL %: 2 % (ref 1–7)
HCT: 35.6 % (ref 31.2–41.9)
HGB: 11.8 g/dL (ref 10.9–14.3)
LYMPHOCYTE #: 1.7 10*3/uL (ref 1.10–3.10)
LYMPHOCYTE %: 33 % (ref 16–46)
MCH: 28.6 pg (ref 24.7–32.8)
MCHC: 33.2 g/dL (ref 32.3–35.6)
MCV: 86.1 fL (ref 75.5–95.3)
MONOCYTE #: 0.8 10*3/uL (ref 0.20–0.90)
MONOCYTE %: 15 % — ABNORMAL HIGH (ref 4–11)
MPV: 10.8 fL (ref 7.9–10.8)
NEUTROPHIL #: 2.6 10*3/uL (ref 1.90–8.20)
NEUTROPHIL %: 50 % (ref 43–77)
PLATELETS: 144 10*3/uL (ref 140–440)
RBC: 4.14 10*6/uL (ref 3.63–4.92)
RDW: 16.2 % (ref 12.3–17.7)
WBC: 5.3 10*3/uL (ref 3.8–11.8)

## 2024-01-11 LAB — INSULIN, SERUM: INSULIN: 31.7 m[IU]/mL (ref 3.2–32.1)

## 2024-01-11 LAB — MAGNESIUM: MAGNESIUM: 1.7 mg/dL — ABNORMAL LOW (ref 1.9–2.7)

## 2024-01-11 MED ORDER — MAGNESIUM SULFATE 1 GRAM/100 ML IN DEXTROSE 5 % INTRAVENOUS PIGGYBACK
1.0000 g | INJECTION | INTRAVENOUS | Status: AC
Start: 2024-01-11 — End: 2024-01-11
  Administered 2024-01-11 (×2): 1 g via INTRAVENOUS
  Administered 2024-01-11 (×2): 0 g via INTRAVENOUS
  Filled 2024-01-11 (×2): qty 100

## 2024-01-11 MED ORDER — MAGNESIUM OXIDE 400 MG (241.3 MG MAGNESIUM) TABLET
400.0000 mg | ORAL_TABLET | Freq: Two times a day (BID) | ORAL | 0 refills | Status: AC
Start: 2024-01-11 — End: 2024-01-18

## 2024-01-11 NOTE — Telemedicine Consult (Signed)
 Endocrinology Initial Telemedicine Consult     Telemedicine Documentation:  Patient Location: [facility]  Patient/family aware of provider location: yes  Patient/family consent for telemedicine: yes  Examination observed and performed by: Sherolyn Dixon, MD  Provider's Physical Location: [facility]    Today's Date:       01/11/2024  Encounter Start Date:      01/08/2024  Inpatient Admission Date:  01/08/2024  Name:             Connie Barber  Age:             89 y.o.  Attending:             Mulindwa, Deo, MD  PCP             Temple Feeler, MD    Reason for Consult:   Hypoglycemia   Chief Complaint:   Hypoglycemia     HISTORY OF PRESENTING ILLNESS:     Connie Barber is a 89 y.o. female with PMH of CKD, DM TII, HLD,  admitted for hypoglycemia . Endocrinology is consulted for hypoglycemia .       Hx obtained from patient and chart review   Reports has hx of DM type 2 for 10 years    Used to take Amaryl 2 in morning and 1 mg at night   She also used to take Januvia however has stopped it few weeks ago     She has been off any medicine for week and half     Reports low sugars have been dropping early morning .  She has seen lowest in 40's early morning     She reports eating well for breakfast takes oat meal cereal , she eats peanut butter sandwich for lunch or soup   Dinner is meat mostly     She reports feels blank when sugar are low, she typically took orange juice or peanut butter to bring it up       MEDICAL HISTORY:   PAST MEDICAL & SURGICAL HISTORIES:   Past Medical History:   Diagnosis Date    CKD (chronic kidney disease)     Stage 3b    Coronary artery disease     Diabetes mellitus     Dyslipidemia     Essential hypertension     Hepatitis C     Wears dentures     Wears glasses         Past Surgical History:   Procedure Laterality Date    CARDIAC CATHETERIZATION  11/20/2014    HX APPENDECTOMY      HX HYSTERECTOMY              HOME MEDICATIONS:  Outpatient Medications  Marked as Taking for the 01/08/24 encounter [facility] Encounter)   Medication Sig    amLODIPine  (NORVASC) 10 mg Oral Tablet Take 1 Tablet (10 mg total) by mouth Once a day for 90 days    aspirin  (ECOTRIN) 81 mg Oral Tablet, Delayed Release (E.C.) Take 1 Tablet (81 mg total) by mouth Daily    calcitrioL  (ROCALTROL ) 0.25 mcg Oral Capsule Take 1 Capsule (0.25 mcg total) by mouth Daily for 90 days Indications: 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    Colchicine-Probenecid 500-0.5 mg Oral Tablet Take 1 Tablet by mouth Every evening    cyanocobalamin  (VITAMIN B 12) 1,000 mcg Oral Tablet Take 1 Tablet (1,000 mcg total) by mouth Daily    ergocalciferol , vitamin D2, (DRISDOL) 1,250 mcg (  50,000 unit) Oral Capsule Take 1 Capsule (50,000 Units total) by mouth Every 7 days    esomeprazole magnesium  (NEXIUM) 40 mg Oral Capsule, Delayed Release(E.C.) Take 1 Capsule (40 mg total) by mouth Once per day as needed for Other    ezetimibe  (ZETIA ) 10 mg Oral Tablet Take 1 Tablet (10 mg total) by mouth Every morning for 180 days    hydroCHLOROthiazide (HYDRODIURIL) 12.5 mg Oral Tablet Take 1 Tablet (12.5 mg total) by mouth Once a day    levothyroxine (SYNTHROID) 75 mcg Oral Tablet Take 1 Tablet (75 mcg total) by mouth Every morning    magnesium  oxide (MAG-OX) 400 mg Oral Tablet Take 1 Tablet (400 mg total) by mouth Twice daily for 7 days    olmesartan  (BENICAR ) 40 mg Oral Tablet Take 1 Tablet (40 mg total) by mouth Daily       INPATIENT MEDICATIONS:  Current Medications[1]     ALLERGIES:  Allergies[2]     FAMILY HISTORY:  Family Medical History:       Problem Relation (Age of Onset)    Heart Disease Mother    Hypertension (High Blood Pressure) Mother    No Known Problems Sister, Brother, Maternal Grandmother, Maternal Grandfather, Paternal Grandmother, Paternal Grandfather, Daughter, Son, Maternal Aunt, Maternal Uncle, Paternal Aunt, Paternal Uncle, Other    Stroke Father              SOCIAL HISTORY:  Social  History[3]      REVIEW OF SYSTEMS:     ROS: All systems reviewed and negative except for as above.    EXAMINATION:   Vitals:   BP 130/60   Pulse 54   Temp 36.1 C (97 F)   Resp 18   Ht 1.6 m (5' 3)   Wt 79.7 kg (175 lb 9.6 oz)   SpO2 98%   BMI 31.11 kg/m       Full Exam Limited as Encounter is Virtual  BP 130/60   Pulse 54   Temp 36.1 C (97 F)   Resp 18   Ht 1.6 m (5' 3)   Wt 79.7 kg (175 lb 9.6 oz)   SpO2 98%   BMI 31.11 kg/m       General: pleasant, elderly female   Psych: appropriate affect, AOx3, normal speech pattern  Skin: warm, no Pallor   Eyes: non-injected conjunctiva   HENT: atraumatic, oral mucosa moist and pink,   Neck: trachea midline, neck supple   Thyroid : no visible goiter  Lungs: normal respiratory effort   Musculoskeletal: normal looking joints, moves all extremities   Neurological: no tremor, speech fluent        DATA REVIEWED:   I have reviewed previous labs, tests, imaging, and notes.    Recent Labs     01/09/24  1625 01/09/24  2000 01/09/24  2338 01/10/24  0155 01/10/24  0431 01/10/24  0733 01/10/24  1037 01/10/24  1318 01/10/24  1549 01/10/24  1814 01/10/24  1953 01/10/24  2157 01/10/24  2356 01/11/24  0207 01/11/24  0426 01/11/24  0605 01/11/24  0737 01/11/24  1029 01/11/24  1135 01/11/24  1251   GLUIP 129* 106* 90 91 83 83 137* 143* 129* 169* 141* 96 96 117* 105* 105* 100 193* 176* 166*       Lab Results   Component Value Date    HA1C 6.8 (H) 09/14/2023        Results for orders placed or performed during the hospital encounter of 01/08/24 (  from the past 24 hours)   POC BLOOD GLUCOSE (RESULTS)    Collection Time: 01/10/24  3:49 PM   Result Value Ref Range    GLUCOSE, POC 129 (H) 70 - 100 mg/dl   POC BLOOD GLUCOSE (RESULTS)    Collection Time: 01/10/24  6:14 PM   Result Value Ref Range    GLUCOSE, POC 169 (H) 70 - 100 mg/dl   POC BLOOD GLUCOSE (RESULTS)    Collection Time: 01/10/24  7:53 PM   Result Value Ref Range    GLUCOSE, POC 141 (H) 70 - 100 mg/dl   POC BLOOD  GLUCOSE (RESULTS)    Collection Time: 01/10/24  9:57 PM   Result Value Ref Range    GLUCOSE, POC 96 70 - 100 mg/dl   POC BLOOD GLUCOSE (RESULTS)    Collection Time: 01/10/24 11:56 PM   Result Value Ref Range    GLUCOSE, POC 96 70 - 100 mg/dl   POC BLOOD GLUCOSE (RESULTS)    Collection Time: 01/11/24  2:07 AM   Result Value Ref Range    GLUCOSE, POC 117 (H) 70 - 100 mg/dl   POC BLOOD GLUCOSE (RESULTS)    Collection Time: 01/11/24  4:26 AM   Result Value Ref Range    GLUCOSE, POC 105 (H) 70 - 100 mg/dl   COMPREHENSIVE METABOLIC PANEL, NON-FASTING    Collection Time: 01/11/24  5:36 AM   Result Value Ref Range    SODIUM 141 136 - 145 mmol/L    POTASSIUM 5.1 3.5 - 5.1 mmol/L    CHLORIDE 95 (L) 98 - 107 mmol/L    CO2 TOTAL 38 (H) 21 - 31 mmol/L    ANION GAP 8 4 - 13 mmol/L    BUN 33 (H) 7 - 25 mg/dL    CREATININE 1.61 (H) 0.60 - 1.30 mg/dL    BUN/CREA RATIO 19 6 - 22    ESTIMATED GFR 29 (L) >59 mL/min/1.65m^2    ALBUMIN 3.5 3.5 - 5.7 g/dL    CALCIUM 9.4 8.6 - 09.6 mg/dL    GLUCOSE 045 (H) 74 - 109 mg/dL    ALKALINE PHOSPHATASE 80 34 - 104 U/L    ALT (SGPT) 13 7 - 52 U/L    AST (SGOT) 13 13 - 39 U/L    BILIRUBIN TOTAL 0.5 0.3 - 1.0 mg/dL    PROTEIN TOTAL 6.7 6.4 - 8.9 g/dL    ALBUMIN/GLOBULIN RATIO 1.1 0.8 - 1.4    OSMOLALITY, CALCULATED 289 270 - 290 mOsm/kg    CALCIUM, CORRECTED 9.8 8.9 - 10.8 mg/dL    GLOBULIN 3.2 2.0 - 3.5    Narrative    Estimated Glomerular Filtration Rate (eGFR) is calculated using the CKD-EPI (2021) equation, intended for patients 21 years of age and older. If gender is not documented or unknown, there will be no eGFR calculation.     CBC/DIFF    Collection Time: 01/11/24  5:36 AM    Narrative    The following orders were created for panel order CBC/DIFF.  Procedure                               Abnormality         Status                     ---------                               -----------         ------  CBC WITH DIFF[[id number]]                Abnormal            Final  result                 Please view results for these tests on the individual orders.   MAGNESIUM     Collection Time: 01/11/24  5:36 AM   Result Value Ref Range    MAGNESIUM  1.7 (L) 1.9 - 2.7 mg/dL   CBC WITH DIFF    Collection Time: 01/11/24  5:36 AM   Result Value Ref Range    WBC 5.3 3.8 - 11.8 x10^3/uL    RBC 4.14 3.63 - 4.92 x10^6/uL    HGB 11.8 10.9 - 14.3 g/dL    HCT 78.4 69.6 - 29.5 %    MCV 86.1 75.5 - 95.3 fL    MCH 28.6 24.7 - 32.8 pg    MCHC 33.2 32.3 - 35.6 g/dL    RDW 28.4 13.2 - 44.0 %    PLATELETS 144 140 - 440 x10^3/uL    MPV 10.8 7.9 - 10.8 fL    NEUTROPHIL % 50 43 - 77 %    LYMPHOCYTE % 33 16 - 46 %    MONOCYTE % 15 (H) 4 - 11 %    EOSINOPHIL % 2 1 - 7 %    BASOPHIL % 1 0 - 1 %    NEUTROPHIL # 2.60 1.90 - 8.20 x10^3/uL    LYMPHOCYTE # 1.70 1.10 - 3.10 x10^3/uL    MONOCYTE # 0.80 0.20 - 0.90 x10^3/uL    EOSINOPHIL # 0.10 0.00 - 0.50 x10^3/uL    BASOPHIL # 0.00 0.00 - 0.10 x10^3/uL   POC BLOOD GLUCOSE (RESULTS)    Collection Time: 01/11/24  6:05 AM   Result Value Ref Range    GLUCOSE, POC 105 (H) 70 - 100 mg/dl   POC BLOOD GLUCOSE (RESULTS)    Collection Time: 01/11/24  7:37 AM   Result Value Ref Range    GLUCOSE, POC 100 70 - 100 mg/dl   POC BLOOD GLUCOSE (RESULTS)    Collection Time: 01/11/24 10:29 AM   Result Value Ref Range    GLUCOSE, POC 193 (H) 70 - 100 mg/dl   POC BLOOD GLUCOSE (RESULTS)    Collection Time: 01/11/24 11:35 AM   Result Value Ref Range    GLUCOSE, POC 176 (H) 70 - 100 mg/dl   POC BLOOD GLUCOSE (RESULTS)    Collection Time: 01/11/24 12:51 PM   Result Value Ref Range    GLUCOSE, POC 166 (H) 70 - 100 mg/dl           ASSESSMENT & RECOMMENDATIONS:     Connie Barber is a 89 y.o. female with PMH of CKD, DM TII, HLD,  admitted for hypoglycemia . Endocrinology is consulted for hypoglycemia    Hx of T2 DM - well controlled now off all medications   Hypoglycemia level III needing assistance fasting   Hx of CKD stage 4      Has hx of  10 years plus of DM   Home regimen :  Januvia and  Amaryl however has not taken any for last 1 week per PCP   Hypoglycemic for last 1 week   Fasting mainly in 40's early morning around 2-3 am   Has confirmed with FS and meets whipple triad  Her lowest has been 55 on BMP   Has been  on D5W stopped this morning     So far had no low  TSH has been normal     Recommend :  Will continue to hold the DW till she is in 19 and than check BMP , Beta hydroxy , insulin , pro insulin and c peptide with cortisol   Once sent , will do 1 mg glucagon and check beta hydroxy and BMP at 10 and 30 min   Do FS q 4 as long as sugar are above 70, can do q 1 below 70     If she does not drop in next 24 hrs can do 72 hr fast,   Have sent SFU screen and IGF-1 and 2 ,sometimes in CKD the medicine effect can last longer esp glipizide       Recommendations discussed with primary team, PRN HOSPITALIST 1.     Thank you for this consult. Will continue to follow. Please page with questions.    On the day of the encounter, a total of  70  minutes were spent on this patient encounter including review of historical information, examination, and documentation    Sherolyn Dixon, MD                              [1]   Current Facility-Administered Medications:     [Held by provider] D10W premixed infusion, , Intravenous, Continuous, Rhonda Certain, Deo, MD, Stopped at 01/11/24 6962    dextrose  (GLUTOSE) 40% oral gel, 15 g, Oral, Q15 Min PRN, Prescott, Timothy, PA-C    dextrose  50% (0.5 g/mL) injection - syringe, 12.5 g, Intravenous, Q15 Min PRN, Niki Barter, PA-C, 50 mL at 01/08/24 1841    dextrose  50% (0.5 g/mL) injection - syringe, 12.5 g, Intravenous, Now, Rhonda Certain, Deo, MD    glucagon injection 1 mg, 1 mg, IntraMUSCULAR, Once PRN, Niki Barter, PA-C    heparin  5,000 unit/mL injection, 5,000 Units, Subcutaneous, Q8HRS, Niki Barter, PA-C, 5,000 Units at 01/11/24 0550  [2] No Known Allergies  [3]   Social History  Tobacco Use    Smoking status: Former     Types: Cigarettes    Smokeless tobacco:  Never   Vaping Use    Vaping status: Never Used   Substance Use Topics    Alcohol use: Not Currently    Drug use: Never

## 2024-01-11 NOTE — Progress Notes (Signed)
 [place] [facility]    HOSPITALIST PROGRESS NOTE    Connie Barber  Elvie Hammed  Date of service: 01/11/2024  Date of Admission:  01/08/2024  Hospital Day:  LOS: 3 days     Subjective:   Patient was admitted on 01/08/2024 with hypoglycemia.  She does admit to not eating and drinking well.  D10 drip was started to maintain blood sugar.  She also reports worsening lower extremity edema, this is chronic.  She does not use oxygen at home but was hypoxic in the ER and placed on 2 L oxygen.  She does not take insulin at home., She also states that she did not take any of her diabetic medications at home except for 1 dose of Januvia the day prior.    01/10/24- D10 drip is currently on 25 mL/hour, blood sugar is still only in the 90s and patient is eating well.  C-peptide and serum insulin ordered.    01/11/24- D10 drip has been placed on hold, patient is maintaining blood sugar at this time.  Endocrinology consulted, see note, appreciate recommendations.  Patient continues to eat well.    Vital Signs:  Filed Vitals:    01/11/24 0933 01/11/24 1128 01/11/24 1158 01/11/24 1623   BP:   130/60 136/66   Pulse: 54 (!) 44 54    Resp: 20  18    Temp: 36.3 C (97.4 F)  36.1 C (97 F) 36.7 C (98 F)   SpO2: 97%  98%             Physical Exam  Cardiovascular:      Rate and Rhythm: Normal rate and regular rhythm.      Pulses: Normal pulses.      Heart sounds: Normal heart sounds.   Pulmonary:      Breath sounds: Normal breath sounds.   Abdominal:      General: Bowel sounds are normal.      Palpations: Abdomen is soft.      Tenderness: There is no abdominal tenderness.   Musculoskeletal:      Cervical back: Normal range of motion and neck supple.      Right lower leg: Edema present.      Left lower leg: Edema present.   Skin:     General: Skin is warm and dry.      Capillary Refill: Capillary refill takes less than 2 seconds.   Neurological:      General: No focal deficit present.      Mental Status: She is alert and  oriented to person, place, and time.   Psychiatric:         Behavior: Behavior normal.            Intake & Output:    Intake/Output Summary (Last 24 hours) at 01/11/2024 1635  Last data filed at 01/11/2024 1400  Gross per 24 hour   Intake 660 ml   Output --   Net 660 ml     I/O current shift:  06/16 0700 - 06/16 1859  In: 660 [P.O.:460]  Out: -   Emesis:    BM:    Date of Last Bowel Movement: 01/10/24  Heme:      [Held by provider] D10W premixed infusion, , Intravenous, Continuous  dextrose  (GLUTOSE) 40% oral gel, 15 g, Oral, Q15 Min PRN  dextrose  50% (0.5 g/mL) injection - syringe, 12.5 g, Intravenous, Q15 Min PRN  dextrose  50% (0.5 g/mL) injection - syringe, 12.5 g, Intravenous, Now  glucagon injection 1 mg, 1 mg, IntraMUSCULAR, Once PRN  heparin  5,000 unit/mL injection, 5,000 Units, Subcutaneous, Q8HRS          Labs:  Recent Results (from the past 48 hours)   CBC WITH DIFF    Collection Time: 01/11/24  5:36 AM   Result Value    WBC 5.3    HGB 11.8    HCT 35.6    PLATELETS 144      No results found for this or any previous visit (from the past 48 hours).     Recent Results (from the past 48 hours)   COMPREHENSIVE METABOLIC PANEL, NON-FASTING    Collection Time: 01/11/24  5:36 AM   Result Value    ALKALINE PHOSPHATASE 80    ALT (SGPT) 13    AST (SGOT) 13   COMPREHENSIVE METABOLIC PANEL, NON-FASTING    Collection Time: 01/10/24  9:25 AM   Result Value    ALKALINE PHOSPHATASE 76    ALT (SGPT) 13    AST (SGOT) 13      No results found for this or any previous visit (from the past 48 hours).     No results found for this or any previous visit (from the past 48 hours).     No results found for this or any previous visit (from the past 8 weeks).   No results found for this or any previous visit (from the past 48 hours).     Microbiology:  Hospital Encounter on 01/08/24 (from the past 96 hours)   URINE CULTURE,ROUTINE    Collection Time: 01/08/24  2:10 PM    Specimen: Urine, Site not specified   Culture Result Status     URINE CULTURE 20,000 CFU/mL Mixed Flora (A) Final    Narrative    These results may or may not reflect clinically significant isolates because there is contamination with the usual skin flora that could interfere  with the growth of potential pathogens.         Imaging:   ECG 12 LEAD  Sinus bradycardia  Moderate voltage criteria for LVH, may be normal variant ( R in aVL , Cornell product )  ST & T wave abnormality, consider inferior ischemia  Abnormal ECG  When compared with ECG of 04-Dec-2023 20:43,  Vent. rate has decreased BY  30 BPM  Non-specific change in ST segment in Anterior leads  T wave inversion no longer evident in Anterolateral leads  Confirmed by Kazienko, Brian ([id number]) on 01/11/2024 11:37:04 AM      Assessment/ Plan:   Active Hospital Problems   (*Primary Problem)    Diagnosis    Type 2 diabetes mellitus with hypoglycemia    Hypoglycemia    CHF exacerbation    Secondary hyperparathyroidism of renal origin (CMS HCC)    Diabetes mellitus    Essential hypertension    Chronic kidney disease, stage 4 (severe) (CMS HCC)     Noted by PETRARCA ROBERT DO  last documented on [id number]         Nutrition:    DIET REGULAR Do you want to initiate MNT Protocol? Yes; Additional modifications/limitations: NO ADDED SALT    Additional clinical characteristics related to nutrition:    - monitor for weight changes   - monitor intake and output    - monitor bowel functions      Disposition Planning:     Hypoglycemia  Chronic lower extremity edema  Diabetes mellitus  Essential hypertension  Chronic kidney disease stage  4    -hold D10 drip and monitor blood sugars every 2 hours  -endocrinology states hold dextrose  drip until she drops to 54, then check BMP, beta hydroxy butyrate, serum insulin and serum C-peptide with cortisol, see note.  If patient does not drop within next 24 hours, do a 72 hour fast  -blood sugars q.2 hours  -encourage p.o. intake  -restart home medicines as appropriate, hold all diabetic medications  -IV  Lasix  40 mg q.12  -palliative care and endocrinologist consult  -echo showed moderately dilated left atrium, moderate MR and TR and moderate pulmonary hypertension.  EF 50%    DVT px: heparin  tid    The Hospitalist personally evaluated and examined the patient in conjunction with the MLP and agree with the assessments, treatment plan and disposition of the patient as recorded by the [facility].   Barb Bonito, NP-C  01/11/2024  [place] [facility] HOSPITALIST    Plan of care to be adjusted depending on response to current treatment, further testing and evaluation.      Americo Justice MD.  [facility] MEDICINE HOSPITALIST

## 2024-01-11 NOTE — Care Plan (Signed)
 Problem: Wound  Goal: Optimal Coping  Outcome: Ongoing (see interventions/notes)  Intervention: Support Patient and Family Response  Recent Flowsheet Documentation  Taken 01/11/2024 0854 by Ane Keener, RN  Family/Support System Care:   involvement promoted   presence promoted   support provided  Goal: Optimal Functional Ability  Outcome: Ongoing (see interventions/notes)  Intervention: Optimize Functional Ability  Recent Flowsheet Documentation  Taken 01/11/2024 1000 by Ane Keener, RN  Activity Assistance Provided: assistance, stand-by  Goal: Absence of Infection Signs and Symptoms  Outcome: Ongoing (see interventions/notes)  Goal: Improved Oral Intake  Outcome: Ongoing (see interventions/notes)  Goal: Optimal Pain Control and Function  Outcome: Ongoing (see interventions/notes)  Goal: Skin Health and Integrity  Outcome: Ongoing (see interventions/notes)  Intervention: Optimize Skin Protection  Recent Flowsheet Documentation  Taken 01/11/2024 1051 by Ane Keener, RN  Pressure Reduction Techniques:   Mobility is maximized   Moisture, shear and nutrition are maximized   Frequent weight shifting encouraged  Pressure Reduction Devices: Repositioning wedges/pillows utilized  Taken 01/11/2024 1000 by Ane Keener, RN  Head of Bed [facility] [address]) Positioning: HOB at 60-90 degrees  Goal: Optimal Wound Healing  Outcome: Ongoing (see interventions/notes)  Intervention: Promote Wound Healing  Recent Flowsheet Documentation  Taken 01/11/2024 1051 by Ane Keener, RN  Pressure Reduction Techniques:   Mobility is maximized   Moisture, shear and nutrition are maximized   Frequent weight shifting encouraged  Pressure Reduction Devices: Repositioning wedges/pillows utilized     Problem: Adult Inpatient Plan of Care  Goal: Plan of Care Review  Outcome: Ongoing (see interventions/notes)  Goal: Patient-Specific Goal (Individualized)  Outcome: Ongoing (see interventions/notes)  Flowsheets (Taken 01/11/2024 0854)  Individualized Care Needs: monitor labs & VS, assist  w/ ADLs as needed, maintain glycemic control  Anxieties, Fears or Concerns: none voiced at this time  Goal: Absence of Hospital-Acquired Illness or Injury  Outcome: Ongoing (see interventions/notes)  Intervention: Identify and Manage Fall Risk  Recent Flowsheet Documentation  Taken 01/11/2024 0854 by Ane Keener, RN  Safety Promotion/Fall Prevention:   activity supervised   fall prevention program maintained   nonskid shoes/slippers when out of bed   safety round/check completed  Intervention: Prevent Skin Injury  Recent Flowsheet Documentation  Taken 01/11/2024 1051 by Ane Keener, RN  Skin Protection:   adhesive use limited   tubing/devices free from skin contact  Taken 01/11/2024 1000 by Ane Keener, RN  Body Position: supine, head elevated  Taken 01/11/2024 0800 by Ane Keener, RN  Body Position: (Simultaneous filing. User may not have seen previous data.) supine, head elevated  Intervention: Prevent and Manage VTE (Venous Thromboembolism) Risk  Recent Flowsheet Documentation  Taken 01/11/2024 0854 by Ane Keener, RN  VTE Prevention/Management: ambulation promoted  Intervention: Prevent Infection  Recent Flowsheet Documentation  Taken 01/11/2024 0854 by Ane Keener, RN  Infection Prevention:   glycemic control managed   rest/sleep promoted  Goal: Optimal Comfort and Wellbeing  Outcome: Ongoing (see interventions/notes)  Intervention: Provide Person-Centered Care  Recent Flowsheet Documentation  Taken 01/11/2024 0854 by Ane Keener, RN  Trust Relationship/Rapport:   care explained   choices provided   emotional support provided   empathic listening provided   questions answered   questions encouraged   reassurance provided   thoughts/feelings acknowledged  Goal: Rounds/Family Conference  Outcome: Ongoing (see interventions/notes)     Problem: Skin Injury Risk Increased  Goal: Skin Health and Integrity  Outcome: Ongoing (see interventions/notes)  Intervention: Optimize Skin Protection  Recent Flowsheet Documentation  Taken 01/11/2024 1051 by  Ane Keener, RN  Pressure Reduction Techniques:   Mobility is maximized   Moisture, shear and nutrition are maximized   Frequent weight shifting encouraged  Pressure Reduction Devices: Repositioning wedges/pillows utilized  Skin Protection:   adhesive use limited   tubing/devices free from skin contact  Taken 01/11/2024 1000 by Ane Keener, RN  Head of Bed [facility]) Positioning: HOB at 60-90 degrees     Problem: Comorbidity Management  Goal: Blood Glucose Level Within Target Range  Outcome: Ongoing (see interventions/notes)

## 2024-01-12 ENCOUNTER — Encounter (HOSPITAL_COMMUNITY): Payer: Self-pay | Admitting: Internal Medicine

## 2024-01-12 DIAGNOSIS — Z029 Encounter for administrative examinations, unspecified: Secondary | ICD-10-CM

## 2024-01-12 DIAGNOSIS — N179 Acute kidney failure, unspecified: Secondary | ICD-10-CM

## 2024-01-12 DIAGNOSIS — I2510 Atherosclerotic heart disease of native coronary artery without angina pectoris: Secondary | ICD-10-CM

## 2024-01-12 LAB — COMPREHENSIVE METABOLIC PANEL, NON-FASTING
ALBUMIN/GLOBULIN RATIO: 1.1 (ref 0.8–1.4)
ALBUMIN: 3.8 g/dL (ref 3.5–5.7)
ALKALINE PHOSPHATASE: 87 U/L (ref 34–104)
ALT (SGPT): 17 U/L (ref 7–52)
ANION GAP: 8 mmol/L (ref 4–13)
AST (SGOT): 16 U/L (ref 13–39)
BILIRUBIN TOTAL: 0.4 mg/dL (ref 0.3–1.0)
BUN/CREA RATIO: 20 (ref 6–22)
BUN: 40 mg/dL — ABNORMAL HIGH (ref 7–25)
CALCIUM, CORRECTED: 9.8 mg/dL (ref 8.9–10.8)
CALCIUM: 9.6 mg/dL (ref 8.6–10.3)
CHLORIDE: 97 mmol/L — ABNORMAL LOW (ref 98–107)
CO2 TOTAL: 37 mmol/L — ABNORMAL HIGH (ref 21–31)
CREATININE: 2.05 mg/dL — ABNORMAL HIGH (ref 0.60–1.30)
ESTIMATED GFR: 23 mL/min/{1.73_m2} — ABNORMAL LOW (ref >59)
GLOBULIN: 3.5 (ref 2.0–3.5)
GLUCOSE: 104 mg/dL (ref 74–109)
OSMOLALITY, CALCULATED: 293 mosm/kg — ABNORMAL HIGH (ref 270–290)
POTASSIUM: 5.6 mmol/L — ABNORMAL HIGH (ref 3.5–5.1)
PROTEIN TOTAL: 7.3 g/dL (ref 6.4–8.9)
SODIUM: 142 mmol/L (ref 136–145)

## 2024-01-12 LAB — CBC WITH DIFF
BASOPHIL #: 0 10*3/uL (ref 0.00–0.10)
BASOPHIL %: 0 % (ref 0–1)
EOSINOPHIL #: 0.1 10*3/uL (ref 0.00–0.50)
EOSINOPHIL %: 1 % (ref 1–7)
HCT: 39.4 % (ref 31.2–41.9)
HGB: 13 g/dL (ref 10.9–14.3)
LYMPHOCYTE #: 2.3 10*3/uL (ref 1.10–3.10)
LYMPHOCYTE %: 35 % (ref 16–46)
MCH: 28.5 pg (ref 24.7–32.8)
MCHC: 33.1 g/dL (ref 32.3–35.6)
MCV: 86.1 fL (ref 75.5–95.3)
MONOCYTE #: 0.9 10*3/uL (ref 0.20–0.90)
MONOCYTE %: 14 % — ABNORMAL HIGH (ref 4–11)
MPV: 11.2 fL — ABNORMAL HIGH (ref 7.9–10.8)
NEUTROPHIL #: 3.2 10*3/uL (ref 1.90–8.20)
NEUTROPHIL %: 49 % (ref 43–77)
PLATELETS: 166 10*3/uL (ref 140–440)
RBC: 4.58 10*6/uL (ref 3.63–4.92)
RDW: 16.4 % (ref 12.3–17.7)
WBC: 6.5 10*3/uL (ref 3.8–11.8)

## 2024-01-12 LAB — POC BLOOD GLUCOSE (RESULTS)
GLUCOSE, POC: 148 mg/dL — ABNORMAL HIGH (ref 70–100)
GLUCOSE, POC: 113 mg/dL — ABNORMAL HIGH (ref 70–100)
GLUCOSE, POC: 263 mg/dL — ABNORMAL HIGH (ref 70–100)
GLUCOSE, POC: 172 mg/dL — ABNORMAL HIGH (ref 70–100)
GLUCOSE, POC: 140 mg/dL — ABNORMAL HIGH (ref 70–100)
GLUCOSE, POC: 254 mg/dL — ABNORMAL HIGH (ref 70–100)

## 2024-01-12 LAB — MAGNESIUM: MAGNESIUM: 2.3 mg/dL (ref 1.9–2.7)

## 2024-01-12 LAB — C-PEPTIDE: C-PEPTIDE: 4.1 ng/mL (ref 1.4–6.8)

## 2024-01-12 MED ORDER — PANTOPRAZOLE 40 MG TABLET,DELAYED RELEASE
40.0000 mg | DELAYED_RELEASE_TABLET | Freq: Every day | ORAL | Status: DC
Start: 2024-01-12 — End: 2024-01-13
  Administered 2024-01-12 – 2024-01-13 (×2): 40 mg via ORAL
  Filled 2024-01-12 (×2): qty 1

## 2024-01-12 MED ORDER — PROBENECID 500 MG-COLCHICINE 0.5 MG TABLET
1.0000 | ORAL_TABLET | Freq: Every evening | ORAL | Status: DC
Start: 2024-01-12 — End: 2024-01-12

## 2024-01-12 MED ORDER — SODIUM ZIRCONIUM CYCLOSILICATE 10 GRAM ORAL POWDER PACKET
10.0000 g | ORAL | Status: AC
Start: 2024-01-12 — End: 2024-01-12
  Administered 2024-01-12: 10 g via ORAL
  Filled 2024-01-12: qty 1

## 2024-01-12 MED ORDER — PROBENECID 500 MG-COLCHICINE 0.5 MG TABLET
1.0000 | ORAL_TABLET | Freq: Every morning | ORAL | Status: DC
Start: 2024-01-12 — End: 2024-01-13
  Administered 2024-01-12 – 2024-01-13 (×2): 0 via ORAL

## 2024-01-12 MED ORDER — LEVOTHYROXINE 50 MCG TABLET
75.0000 ug | ORAL_TABLET | Freq: Every morning | ORAL | Status: DC
Start: 2024-01-12 — End: 2024-01-13
  Administered 2024-01-12 – 2024-01-13 (×2): 75 ug via ORAL
  Filled 2024-01-12 (×2): qty 1

## 2024-01-12 MED ORDER — AMLODIPINE 10 MG TABLET
10.0000 mg | ORAL_TABLET | Freq: Every day | ORAL | Status: DC
Start: 2024-01-12 — End: 2024-01-13
  Administered 2024-01-12 – 2024-01-13 (×2): 10 mg via ORAL
  Filled 2024-01-12 (×2): qty 1

## 2024-01-12 MED ORDER — ASPIRIN 81 MG CHEWABLE TABLET
81.0000 mg | CHEWABLE_TABLET | Freq: Every day | ORAL | Status: DC
Start: 2024-01-12 — End: 2024-01-13
  Administered 2024-01-12 – 2024-01-13 (×2): 81 mg via ORAL
  Filled 2024-01-12 (×2): qty 1

## 2024-01-12 NOTE — Consults (Signed)
 [facility]  Palliative Care Nurse Practitioner  Consult Note    Connie Barber  M, 89 y.o. female  Date of Birth:  06-10-1935  MRN: [id number]  Admit Date: 01/08/2024   Attending: Hospitalist  Temple Feeler, MD   Reason for Consultation: other symptom management   Requesting provider: Osa Blase, NP-C    Chief Complaint:  Low blood sugar    HPI:  Connie Barber is a 89 y.o. female who presented to the ED with complaints of low blood sugar which had been going on for a couple of weeks.  Pt was taking Januvia and glimepiride at home but had stopped these due to hypoglycemia except for one dose Januvia the day prior to coming to ED.  Blood sugars had been going down into 50's.  Also reported worsening pedal edema, takes diuretics at home.  Blood sugar in 40's from EMS, was given d50.  Upon arrival to ED blood sugar 200 but started dropping, on labs down to 55.  Cr 2.04.  She was started d5 1/2 ns.  Blood sugar went down again into 50's and was given additional amp d50, blood sugar up to 223.  Pt admitted with hypoglycemia, chronic lower extremity edema, dm, CKD.  Pt tx with lasix , diabetic medications were held, d10 drip which has now been stopped.  Echo Ef 50-55%, moderate pulmonary hypertension.  Endocrinology was consulted and recommended holding dextrose  drip until glucose drops to 54, then check bmp, beta hydroxy butyrate, serum insulin and serum c-peptide with cortisol, if doesn't drop within next 24 hrs do 72 hr fast.      Subjective:  Pt sitting up in bed.  She states she is feeling okay today.  Blood glucose 113 and 263 this morning.  Pt verbalized she ate all her breakfast this morning.  She voices no complaints at this time.    Review of Systems:  ROS: Other than ROS in the HPI, all other systems were negative.    Past Medical History:   Diagnosis Date    CKD (chronic kidney disease)     Stage 3b    Coronary artery disease     Diabetes mellitus     Dyslipidemia     Essential  hypertension     Hepatitis C     Wears dentures     Wears glasses          Past Surgical History:   Procedure Laterality Date    CARDIAC CATHETERIZATION  11/20/2014    HX APPENDECTOMY      HX HYSTERECTOMY            Family Medical History:       Problem Relation (Age of Onset)    Heart Disease Mother    Hypertension (High Blood Pressure) Mother    No Known Problems Sister, Brother, Maternal Grandmother, Maternal Grandfather, Paternal Grandmother, Paternal Grandfather, Daughter, Son, Maternal Aunt, Maternal Uncle, Paternal Aunt, Paternal Uncle, Other    Stroke Father            Social History     Socioeconomic History    Marital status: Widowed   Tobacco Use    Smoking status: Former     Types: Cigarettes    Smokeless tobacco: Never   Vaping Use    Vaping status: Never Used   Substance and Sexual Activity    Alcohol use: Not Currently    Drug use: Never     Social Determinants of Health     Social  Connections: Low Risk  (01/08/2024)    Social Connections     SDOH Social Isolation: 5 or more times a week       Current Outpatient Medications   Medication Instructions    amLODIPine  (NORVASC) 10 mg, Oral, Daily    aspirin  (ECOTRIN) 81 mg, Daily    calcitrioL  (ROCALTROL ) 0.25 mcg, Oral, Daily    Colchicine-Probenecid 500-0.5 mg Oral Tablet 1 Tablet, EVERY EVENING    cyanocobalamin  (VITAMIN B 12) 1,000 mcg, Daily    ergocalciferol  (vitamin D2) (DRISDOL) 50,000 Units, EVERY 7 DAYS    esomeprazole magnesium  (NEXIUM) 40 mg, DAILY PRN    ezetimibe  (ZETIA ) 10 mg, Oral, EVERY MORNING    glimepiride (AMARYL) 1 mg, EVERY MORNING WITH BREAKFAST    glimepiride (AMARYL) 2 mg, EVERY MORNING WITH BREAKFAST    hydroCHLOROthiazide (HYDRODIURIL) 12.5 mg, DAILY    levothyroxine (SYNTHROID) 75 mcg, EVERY MORNING    magnesium  oxide (MAG-OX) 400 mg, Oral, Daily    magnesium  oxide (MAG-OX) 400 mg, Oral, 2 TIMES DAILY    olmesartan  (BENICAR ) 40 mg, Daily    pravastatin (PRAVACHOL) 10 mg, EVERY MORNING    SITagliptin phosphate (JANUVIA) 50 mg,  Daily    telmisartan  (MICARDIS ) 80 mg, Oral, Daily      Allergies[1]     Physical Exam:  Constitutional: no distress  Respiratory: Clear to auscultation bilaterally.   Cardiovascular: S1, S2 normal  Gastrointestinal: non-distended, Soft, non-tender, Bowel sounds normal  Extremities: Bilateral lower extremity edema  Integumentary:  Skin warm and dry  Neurologic: Alert and oriented x3    BP (!) 142/59   Pulse 54   Temp 36.4 C (97.5 F)   Resp (!) 22   Ht 1.6 m (5' 3)   Wt 79.7 kg (175 lb 9.6 oz)   SpO2 98%   BMI 31.11 kg/m        Pain: Numeric 0     Labs:  Lab Results Today:    Results for orders placed or performed during the hospital encounter of 01/08/24 (from the past 24 hours)   POC BLOOD GLUCOSE (RESULTS)   Result Value Ref Range    GLUCOSE, POC 276 (H) 70 - 100 mg/dl   POC BLOOD GLUCOSE (RESULTS)   Result Value Ref Range    GLUCOSE, POC 212 (H) 70 - 100 mg/dl   POC BLOOD GLUCOSE (RESULTS)   Result Value Ref Range    GLUCOSE, POC 185 (H) 70 - 100 mg/dl   POC BLOOD GLUCOSE (RESULTS)   Result Value Ref Range    GLUCOSE, POC 148 (H) 70 - 100 mg/dl   POC BLOOD GLUCOSE (RESULTS)   Result Value Ref Range    GLUCOSE, POC 113 (H) 70 - 100 mg/dl   COMPREHENSIVE METABOLIC PANEL, NON-FASTING   Result Value Ref Range    SODIUM 142 136 - 145 mmol/L    POTASSIUM 5.6 (H) 3.5 - 5.1 mmol/L    CHLORIDE 97 (L) 98 - 107 mmol/L    CO2 TOTAL 37 (H) 21 - 31 mmol/L    ANION GAP 8 4 - 13 mmol/L    BUN 40 (H) 7 - 25 mg/dL    CREATININE 0.86 (H) 0.60 - 1.30 mg/dL    BUN/CREA RATIO 20 6 - 22    ESTIMATED GFR 23 (L) >59 mL/min/1.22m^2    ALBUMIN 3.8 3.5 - 5.7 g/dL    CALCIUM 9.6 8.6 - 57.8 mg/dL    GLUCOSE 469 74 - 629 mg/dL    ALKALINE  PHOSPHATASE 87 34 - 104 U/L    ALT (SGPT) 17 7 - 52 U/L    AST (SGOT) 16 13 - 39 U/L    BILIRUBIN TOTAL 0.4 0.3 - 1.0 mg/dL    PROTEIN TOTAL 7.3 6.4 - 8.9 g/dL    ALBUMIN/GLOBULIN RATIO 1.1 0.8 - 1.4    OSMOLALITY, CALCULATED 293 (H) 270 - 290 mOsm/kg    CALCIUM, CORRECTED 9.8 8.9 - 10.8 mg/dL     GLOBULIN 3.5 2.0 - 3.5   MAGNESIUM    Result Value Ref Range    MAGNESIUM  2.3 1.9 - 2.7 mg/dL   CBC WITH DIFF   Result Value Ref Range    WBC 6.5 3.8 - 11.8 x10^3/uL    RBC 4.58 3.63 - 4.92 x10^6/uL    HGB 13.0 10.9 - 14.3 g/dL    HCT 16.1 09.6 - 04.5 %    MCV 86.1 75.5 - 95.3 fL    MCH 28.5 24.7 - 32.8 pg    MCHC 33.1 32.3 - 35.6 g/dL    RDW 40.9 81.1 - 91.4 %    PLATELETS 166 140 - 440 x10^3/uL    MPV 11.2 (H) 7.9 - 10.8 fL    NEUTROPHIL % 49 43 - 77 %    LYMPHOCYTE % 35 16 - 46 %    MONOCYTE % 14 (H) 4 - 11 %    EOSINOPHIL % 1 1 - 7 %    BASOPHIL % 0 0 - 1 %    NEUTROPHIL # 3.20 1.90 - 8.20 x10^3/uL    LYMPHOCYTE # 2.30 1.10 - 3.10 x10^3/uL    MONOCYTE # 0.90 0.20 - 0.90 x10^3/uL    EOSINOPHIL # 0.10 0.00 - 0.50 x10^3/uL    BASOPHIL # 0.00 0.00 - 0.10 x10^3/uL   POC BLOOD GLUCOSE (RESULTS)   Result Value Ref Range    GLUCOSE, POC 263 (H) 70 - 100 mg/dl       Imaging Studies:    Images and Reports reviewed to current date.      ACP:  Advance directives discussed and information provided.    Code Status:  Currently a full code and wishes to remain a full code.    GOC:  Patient resides at home with her daughter Alvah Auerbach.  She states prior to admission she was independent for all ADL's.  She states she used a cane for ambulation when she went out of the house but didn't need it in the home.  Pt's daughter Inda Manchester was present and participated in goc conversation with patient's permission.  We reviewed her current condition and plan of care.  She states she has had diabetes for around 10 years.  She reports her blood glucose started dropping in the last week down into 50's and her PCP stopped ehr Januvia and Amaryl.  She states her glucose was still dropping even after stopping the medicines.  She state she would take glucose tabs, OJ and peanut butter when her blood sugar dropped.  Endocrinology is seeing the patient this admission and making recommendations.  Pt states she does not follow with an endocrinologist  outpatient.  She reports she eats well at home and eats 6 small meals a day.  Pt also has CKD and follows with Dr. Kayleen Party.  Education on the disease trajectories of chronic health conditions and impact on overall health discussed, patient/daughter voiced understanding and had no questions.  Palliative care discussed and patient/daughter would like services when discharged.    PPS prior  to hospitalization: 70%    PPS currently: 60%      Persons present and participating in discussion: Patient, Inda Manchester      Was the conversation voluntary? Yes      Plan:  Community palliative services    Patient Disposition/Discharge needs:  Home    ACP/GOC time:     19 minutes    Total visit time:     44 minutes including ACP/GOC time, Face to face, reviewing medical records, and documentation time.    Thank you for this consult.    Elisabeth Guild, APRN,NP-C  Palliative Care Nurse Practitioner    This note may have been partially generated using Mmodal Fluency Direct system and there may be some incorrect words, spellings, and punctuation that were not noted in checking the note before saving.       [1] No Known Allergies

## 2024-01-12 NOTE — Telemedicine Consult (Signed)
 Endocrinology Telemedicine Consult Follow-Up Note      Telemedicine Documentation:  Patient Location: [facility]  Patient/family aware of provider location: yes  Patient/family consent for telemedicine: yes  Examination observed and performed by: Eudora Heron, MD  Provider's Physical Location: [facility]    Today's Date:       01/12/2024  Encounter Start Date:      01/08/2024  Inpatient Admission Date:  01/08/2024  Name:             Connie Barber  Age:             89 y.o.  Attending:             Mulindwa, Deo, MD  PCP             Temple Feeler, MD      Reason for Consult: Hypoglycemia  Chief Complaint:  Hypoglycemia    Subjective:  Patient feels well this morning, had elevated blood sugars.  Denies any episodes of low blood sugars since arriving to the hospital.    Current Facility-Administered Medications   Medication Dose Route Frequency Provider Last Rate Last Admin    amLODIPine  (NORVASC) tablet  10 mg Oral Daily Redden, Heather, FNP-BC   10 mg at 01/12/24 1241    aspirin  chewable tablet 81 mg  81 mg Oral Daily Redden, Heather, FNP-BC   81 mg at 01/12/24 0934    [Held by provider] D10W premixed infusion   Intravenous Continuous Mulindwa, Deo, MD   Stopped at 01/11/24 0812    dextrose  (GLUTOSE) 40% oral gel  15 g Oral Q15 Min PRN Niki Barter, PA-C        dextrose  50% (0.5 g/mL) injection - syringe  12.5 g Intravenous Q15 Min PRN Niki Barter, PA-C   50 mL at 01/08/24 1841    dextrose  50% (0.5 g/mL) injection - syringe  12.5 g Intravenous Now Mulindwa, Deo, MD        glucagon injection 1 mg  1 mg IntraMUSCULAR Once PRN Niki Barter, PA-C        heparin  5,000 unit/mL injection  5,000 Units Subcutaneous Q8HRS Niki Barter, PA-C   5,000 Units at 01/12/24 0530    levothyroxine (SYNTHROID) tablet 75 mcg  75 mcg Oral QAM Redden, Pattie Borders, FNP-BC   75 mcg at 01/12/24 0934    pantoprazole  (PROTONIX ) delayed release tablet  40 mg Oral Daily Redden, Heather, FNP-BC    40 mg at 01/12/24 0935    probenecid-colchicine 500-0.5mg  tablet  1 Tablet Oral Daily with Breakfast Americo Justice, MD         Objective:  Temperature: 36.4 C (97.5 F)  Heart Rate: 54  BP (Non-Invasive): (!) 142/59  Respiratory Rate: (!) 22  SpO2: 98 %  Full Exam Limited as Encounter is Virtual  General: Well appearing female in no acute distress.  Psychiatric: Normal mood and affect  Neuro: Alert and oriented x 3. Speech fluent. Cranial nerves grossly II-XII intact.   HEENT: Head normocephalic, atraumatic. EOMI, conjunctiva clear. No proptosis.  Thyroid /Neck: Trachea midline.   Lungs: Normal respiratory effort.   Extremities: No edema or erythema noted.  Skin: No visible rashes.      Labs:  Reviewed  Lab Results   Component Value Date    HA1C 6.8 (H) 09/14/2023        BMP (Last 24 Hours):    Recent Results last 24 hours     01/12/24  0553   SODIUM 142  POTASSIUM 5.6*   CHLORIDE 97*   CO2 37*   BUN 40*   CREATININE 2.05*   CALCIUM 9.6   GLUCOSENF 104         CBC with Diff (Last 24 Hours):    Recent Results last 24 hours     01/12/24  0553   WBC 6.5   HGB 13.0   HCT 39.4   MCV 86.1   PLTCNT 166   PMNS 49   LYMPHOCYTES 35   MONOCYTES 14*   EOSINOPHIL 1   BASOPHILS 0  0.00         Recent Labs     01/10/24  1549 01/10/24  1814 01/10/24  1953 01/10/24  2157 01/10/24  2356 01/11/24  0207 01/11/24  0426 01/11/24  0605 01/11/24  0737 01/11/24  1029 01/11/24  1135 01/11/24  1251 01/11/24  1428 01/11/24  1611 01/11/24  2003 01/12/24  0236 01/12/24  0530 01/12/24  1006   GLUIP 129* 169* 141* 96 96 117* 105* 105* 100 193* 176* 166* 276* 212* 185* 148* 113* 263*       Lab Results   Component Value Date    TSH 1.602 12/04/2023       Imaging Studies:  Reviewed      Assessment/Recommendations:  Connie Barber is a 89 y.o. female with PMH of chronic kidney disease, type 2 diabetes, hyperlipidemia admitted for hypoglycemia likely secondary to Amaryl use in the setting of worsening renal functions. Endocrinology is consulted  for hypoglycemia.   Severe life-threatening hypoglycemia  -most likely in the setting of Amaryl use with bad renal functions no further episodes seen in the hospital  -cortisol and thyroid  functions within normal levels  -sulfonylurea screen, IGF-1 and IGF 2 levels pending  -recommend a modified fast as follows:  -make patient NPO after dinner at 6:00 p.m.  -check point of care blood glucose q.4 hours until blood glucose is less than 70 and then check Q 1 hour after that  -if patient's point of care blood glucose is less than 55 recommend the following blood tests to be ordered stat:  BMP, insulin levels, pro insulin levels, C-peptide, beta hydroxybutyrate  -administer glucagon 1 mg IM and measure serum glucose and beta hydroxybutyrate 10 and 30 minutes after administration  -if patient's blood glucose does not drop after 12 hours we will end the fast, and it is likely that her low blood sugars were a result of her sulfonylurea use    Recommendations discussed with primary team, PRN HOSPITALIST 1.     Thank you for this consult. Will continue to follow. Please page with questions.    On the day of the encounter, a total of  45 minutes were spent on this patient encounter including review of historical information, examination, and documentation.    Eudora Heron, MD     I saw and examined the patient. I reviewed the fellow's note. I agree with the findings and plan of care as documented in the fellow's note. Any exceptions/additions are edited/noted.     Sherolyn Dixon, MD

## 2024-01-12 NOTE — Nurses Notes (Signed)
 Assessment and documentation performed by Joya Nissen RN observed and reviewed.

## 2024-01-12 NOTE — Progress Notes (Addendum)
 [facility]  Progress Note    Connie  Connie Barber  Date of service: 01/12/2024  Date of Admission:  01/08/2024  Hospital Day:  LOS: 4 days     Subjective:  Patient is seen in follow-up on 01/12/2024:  States that she ate her breakfast.  Blood glucose improved this morning.  Potassium elevated, Lokelma ordered.  Patient continues with q.4 hours Accu-Cheks.    Review of Systems:    All systems were reviewed, pertinent positive and negative as mentioned in subjective.    Objective:      Vital Signs:  Vitals:    01/12/24 0338 01/12/24 0717 01/12/24 1004 01/12/24 1132   BP: (!) 123/59 (!) 137/58  (!) 142/59   Pulse: 56 51 57 54   Resp: 18 16  (!) 22   Temp: 36.3 C (97.4 F) 36.6 C (97.9 F)  36.4 C (97.5 F)   SpO2: 96% 98%  98%   Weight:       Height:       BMI:                I/O:  I/O last 24 hours:    Intake/Output Summary (Last 24 hours) at 01/12/2024 1149  Last data filed at 01/12/2024 1000  Gross per 24 hour   Intake 560 ml   Output --   Net 560 ml           Physical Exam:    Constitutional:  Patient is lying in the bed, not in any distress appears comfortable.  HENT: Mucus membranes moist.  No oral ulcer.  Head: Normocephalic and atraumatic.   Mouth/Throat: Oropharynx is clear and moist.   Cardiovascular:  S1-S2 audible,   Pulmonary/Chest:  Bilaterally air entry  Abdominal: Soft. Bowel sounds are normal. Patient exhibits no distension. There is no abdominal tenderness. There is no rebound and no guarding.   Neurological:  Patient is awake, alert, oriented X 3, cranial nerves intact, moving all extremities, nonfocal examination  Skin: Skin is warm and dry. No erythema.   Psychiatric: Affect and judgment normal.     amLODIPine  (NORVASC) tablet, 10 mg, Oral, Daily  aspirin  chewable tablet 81 mg, 81 mg, Oral, Daily  [Held by provider] D10W premixed infusion, , Intravenous, Continuous  dextrose  (GLUTOSE) 40% oral gel, 15 g, Oral, Q15 Min PRN  dextrose  50% (0.5 g/mL) injection - syringe, 12.5 g,  Intravenous, Q15 Min PRN  dextrose  50% (0.5 g/mL) injection - syringe, 12.5 g, Intravenous, Now  glucagon injection 1 mg, 1 mg, IntraMUSCULAR, Once PRN  heparin  5,000 unit/mL injection, 5,000 Units, Subcutaneous, Q8HRS  levothyroxine (SYNTHROID) tablet 75 mcg, 75 mcg, Oral, QAM  pantoprazole  (PROTONIX ) delayed release tablet, 40 mg, Oral, Daily  probenecid-colchicine 500-0.5mg  tablet, 1 Tablet, Oral, QPM      Labs:  Results for orders placed or performed during the hospital encounter of 01/08/24 (from the past 24 hours)   POC BLOOD GLUCOSE (RESULTS)    Collection Time: 01/11/24 12:51 PM   Result Value Ref Range    GLUCOSE, POC 166 (H) 70 - 100 mg/dl   POC BLOOD GLUCOSE (RESULTS)    Collection Time: 01/11/24  2:28 PM   Result Value Ref Range    GLUCOSE, POC 276 (H) 70 - 100 mg/dl   POC BLOOD GLUCOSE (RESULTS)    Collection Time: 01/11/24  4:11 PM   Result Value Ref Range    GLUCOSE, POC 212 (H) 70 - 100 mg/dl   POC BLOOD GLUCOSE (RESULTS)  Collection Time: 01/11/24  8:03 PM   Result Value Ref Range    GLUCOSE, POC 185 (H) 70 - 100 mg/dl   POC BLOOD GLUCOSE (RESULTS)    Collection Time: 01/12/24  2:36 AM   Result Value Ref Range    GLUCOSE, POC 148 (H) 70 - 100 mg/dl   POC BLOOD GLUCOSE (RESULTS)    Collection Time: 01/12/24  5:30 AM   Result Value Ref Range    GLUCOSE, POC 113 (H) 70 - 100 mg/dl   CBC/DIFF    Collection Time: 01/12/24  5:53 AM    Narrative    The following orders were created for panel order CBC/DIFF.  Procedure                               Abnormality         Status                     ---------                               -----------         ------                     CBC WITH DIFF[[id number]]                Abnormal            Final result                 Please view results for these tests on the individual orders.   COMPREHENSIVE METABOLIC PANEL, NON-FASTING    Collection Time: 01/12/24  5:53 AM   Result Value Ref Range    SODIUM 142 136 - 145 mmol/L    POTASSIUM 5.6 (H) 3.5 - 5.1 mmol/L     CHLORIDE 97 (L) 98 - 107 mmol/L    CO2 TOTAL 37 (H) 21 - 31 mmol/L    ANION GAP 8 4 - 13 mmol/L    BUN 40 (H) 7 - 25 mg/dL    CREATININE 7.84 (H) 0.60 - 1.30 mg/dL    BUN/CREA RATIO 20 6 - 22    ESTIMATED GFR 23 (L) >59 mL/min/1.82m^2    ALBUMIN 3.8 3.5 - 5.7 g/dL    CALCIUM 9.6 8.6 - 69.6 mg/dL    GLUCOSE 295 74 - 284 mg/dL    ALKALINE PHOSPHATASE 87 34 - 104 U/L    ALT (SGPT) 17 7 - 52 U/L    AST (SGOT) 16 13 - 39 U/L    BILIRUBIN TOTAL 0.4 0.3 - 1.0 mg/dL    PROTEIN TOTAL 7.3 6.4 - 8.9 g/dL    ALBUMIN/GLOBULIN RATIO 1.1 0.8 - 1.4    OSMOLALITY, CALCULATED 293 (H) 270 - 290 mOsm/kg    CALCIUM, CORRECTED 9.8 8.9 - 10.8 mg/dL    GLOBULIN 3.5 2.0 - 3.5    Narrative    Estimated Glomerular Filtration Rate (eGFR) is calculated using the CKD-EPI (2021) equation, intended for patients 18 years of age and older. If gender is not documented or unknown, there will be no eGFR calculation.     MAGNESIUM     Collection Time: 01/12/24  5:53 AM   Result Value Ref Range    MAGNESIUM  2.3 1.9 - 2.7 mg/dL   CBC WITH DIFF    Collection  Time: 01/12/24  5:53 AM   Result Value Ref Range    WBC 6.5 3.8 - 11.8 x10^3/uL    RBC 4.58 3.63 - 4.92 x10^6/uL    HGB 13.0 10.9 - 14.3 g/dL    HCT 16.1 09.6 - 04.5 %    MCV 86.1 75.5 - 95.3 fL    MCH 28.5 24.7 - 32.8 pg    MCHC 33.1 32.3 - 35.6 g/dL    RDW 40.9 81.1 - 91.4 %    PLATELETS 166 140 - 440 x10^3/uL    MPV 11.2 (H) 7.9 - 10.8 fL    NEUTROPHIL % 49 43 - 77 %    LYMPHOCYTE % 35 16 - 46 %    MONOCYTE % 14 (H) 4 - 11 %    EOSINOPHIL % 1 1 - 7 %    BASOPHIL % 0 0 - 1 %    NEUTROPHIL # 3.20 1.90 - 8.20 x10^3/uL    LYMPHOCYTE # 2.30 1.10 - 3.10 x10^3/uL    MONOCYTE # 0.90 0.20 - 0.90 x10^3/uL    EOSINOPHIL # 0.10 0.00 - 0.50 x10^3/uL    BASOPHIL # 0.00 0.00 - 0.10 x10^3/uL   POC BLOOD GLUCOSE (RESULTS)    Collection Time: 01/12/24 10:06 AM   Result Value Ref Range    GLUCOSE, POC 263 (H) 70 - 100 mg/dl        Imaging:         Microbiology:  Hospital Encounter on 01/08/24 (from the past 96 hours)    URINE CULTURE,ROUTINE    Collection Time: 01/08/24  2:10 PM    Specimen: Urine, Site not specified   Culture Result Status    URINE CULTURE 20,000 CFU/mL Mixed Flora (A) Final    Narrative    These results may or may not reflect clinically significant isolates because there is contamination with the usual skin flora that could interfere  with the growth of potential pathogens.             Assessment/ Plan:   Active Hospital Problems    Diagnosis    Type 2 diabetes mellitus with hypoglycemia    Hypoglycemia    CHF exacerbation    Secondary hyperparathyroidism of renal origin (CMS HCC)    Diabetes mellitus    Essential hypertension    Chronic kidney disease, stage 4 (severe) (CMS HCC)      Hypoglycemia  Chronic lower extremity edema  Diabetes mellitus  Essential hypertension  Chronic kidney disease stage 4  Moderate pulmonary hypertension     -hold D10 drip discontinued  -endocrinology states hold dextrose  drip until she drops to 54, then check BMP, beta hydroxy butyrate, serum insulin and serum C-peptide with cortisol, see note.  If patient does not drop within next 24 hours, do a 72 hour fast  -blood sugars q. 4 hours now  -encourage p.o. intake, patient ate all of her breakfast  -restart home medicines as appropriate, hold all diabetic medications  -palliative care and endocrinologist consult  -echo showed moderately dilated left atrium, moderate MR and TR and moderate pulmonary hypertension.  EF 50%.  -Norvasc resumed       Nutrition:    DIET REGULAR Do you want to initiate MNT Protocol? Yes; Additional modifications/limitations: NO ADDED SALT  GUEST TRAY    Additional clinical characteristics related to nutrition:    - monitor for weight changes   - monitor intake and output    - monitor bowel functions  DVT prophylaxis.  Heparin       Disposition Planning:  To be determined    Trevian Hayashida, FNP-BC      This note was partially generated using MModal Fluency Direct system, and there may be some  incorrect words, spellings, and punctuation that were not noted in checking the note before saving.

## 2024-01-12 NOTE — Nurses Notes (Signed)
 TOLD PATIENT THAT OUR PHARMACY DOES NOT HAVE HER KIND OF GOUT PILL, She IS ASKING A FAMILY MEMBER TO BRING HERS IN TO USE HERE, DID NOT WANT US  TO GET A SUBSTITUTE.

## 2024-01-13 DIAGNOSIS — N178 Other acute kidney failure: Secondary | ICD-10-CM

## 2024-01-13 DIAGNOSIS — I129 Hypertensive chronic kidney disease with stage 1 through stage 4 chronic kidney disease, or unspecified chronic kidney disease: Secondary | ICD-10-CM

## 2024-01-13 LAB — CBC
HCT: 38.3 % (ref 31.2–41.9)
HGB: 12.5 g/dL (ref 10.9–14.3)
MCH: 28.4 pg (ref 24.7–32.8)
MCHC: 32.6 g/dL (ref 32.3–35.6)
MCV: 87.2 fL (ref 75.5–95.3)
MPV: 10.8 fL (ref 7.9–10.8)
PLATELETS: 157 10*3/uL (ref 140–440)
RBC: 4.4 10*6/uL (ref 3.63–4.92)
RDW: 16.2 % (ref 12.3–17.7)
WBC: 5.1 10*3/uL (ref 3.8–11.8)

## 2024-01-13 LAB — BASIC METABOLIC PANEL
ANION GAP: 8 mmol/L (ref 4–13)
BUN/CREA RATIO: 22 (ref 6–22)
BUN: 41 mg/dL — ABNORMAL HIGH (ref 7–25)
CALCIUM: 9.3 mg/dL (ref 8.6–10.3)
CHLORIDE: 99 mmol/L (ref 98–107)
CO2 TOTAL: 35 mmol/L — ABNORMAL HIGH (ref 21–31)
CREATININE: 1.89 mg/dL — ABNORMAL HIGH (ref 0.60–1.30)
ESTIMATED GFR: 26 mL/min/{1.73_m2} — ABNORMAL LOW (ref >59)
GLUCOSE: 116 mg/dL — ABNORMAL HIGH (ref 74–109)
OSMOLALITY, CALCULATED: 294 mosm/kg — ABNORMAL HIGH (ref 270–290)
POTASSIUM: 4.8 mmol/L (ref 3.5–5.1)
SODIUM: 142 mmol/L (ref 136–145)

## 2024-01-13 LAB — BETA-HYDROXYBUTYRATE: BETA-HYDROXYBUTYRATE: 0.12 mmol/L (ref 0.02–0.27)

## 2024-01-13 LAB — POC BLOOD GLUCOSE (RESULTS)
GLUCOSE, POC: 116 mg/dL — ABNORMAL HIGH (ref 70–100)
GLUCOSE, POC: 134 mg/dL — ABNORMAL HIGH (ref 70–100)
GLUCOSE, POC: 237 mg/dL — ABNORMAL HIGH (ref 70–100)
GLUCOSE, POC: 92 mg/dL (ref 70–100)

## 2024-01-13 LAB — MAGNESIUM: MAGNESIUM: 2.2 mg/dL (ref 1.9–2.7)

## 2024-01-13 MED ORDER — SODIUM ZIRCONIUM CYCLOSILICATE 10 GRAM ORAL POWDER PACKET
10.0000 g | Freq: Three times a day (TID) | ORAL | Status: DC
Start: 2024-01-13 — End: 2024-01-13
  Administered 2024-01-13 (×2): 10 g via ORAL
  Filled 2024-01-13 (×2): qty 1

## 2024-01-13 NOTE — Nurses Notes (Signed)
 Documentation and assessments were performed by Armando Berne, GN and were reviewed by this RN.      Wally Gunnels, RN

## 2024-01-13 NOTE — Discharge Summary (Signed)
 [place] MEDICINE [facility]     DISCHARGE SUMMARY      PATIENT NAME:  Connie  LILYTH Barber   MRN:  [id number]  DOB:  01-03-1935    INPATIENT ADMISSION DATE: 01/08/2024   DATE OF DISCHARGE:  01/13/24     ATTENDING PHYSICIAN: Halford Levels, FNP-BC     PRIMARY CARE PHYSICIAN: Temple Feeler, MD     HOSPITAL PRESENTATION:  Low blood sugar    Please see full admission H&P for details.      As per BJY:NWGN 89 year old [place] American female with known history of chronic kidney disease, chronic kidney disease, diabetes mellitus, and hyperlipidemia, presents to the ED with the complaints of low blood sugar.  Apparently this has been going on for last couple of weeks.  She has stopped taking her Januvia and glimepiride at home, however did apparently take a dose of Januvia yesterday.  The patient's blood sugars has been going down into the 50s at home.  She does state that she has been eating and drinking well.  She has not had any vomiting or diarrhea.  She denies any fever or chills.  She does state that she gets dizzy when her blood sugars are low.  She also reports worsening of pedal edema.  This apparently is chronic, and the patient has not had any recent changes to diuretics.  She does not wear any oxygen at home, but was apparently hypoxic at time of arrival to the ED, was placed on oxygen at 2 L. she apparently had a blood sugar in the 40s from EMS, she was given an amp of D50.  When she arrived in the ED was 200, however began dropping.  On laboratory studies sugar was down to 55.  Her creatinine is mildly elevated at 2.04.  White count was normal.  Patient was started on D5 half-normal saline at 100 cc an hour, and was sent to our facility for admission.  Upon arrival patient's blood sugars again were down in the 50s.  She was given additional amp of D50 while here, blood sugar at time of my examination is 223.  Patient states she does not take any insulin at home.  She is again not taken any of her  diabetic medications who has had for 1 dose of Januvia yesterday for some reason.  She is eating at time of examination, and does not appear in any distress.  She will be continued on dextrose , with hypoglycemic protocols, we will also ask for diuresis, as the patient does have 3+ pitting pedal edema noted on examination.  Echocardiogram was not in our system, we will be ordering it for the a.m.Aaron Aas  Cardiac echo completed, EF estimated to be 50-55%.  Left ventricular diastolic parameters are normal.  Moderate MR, moderate TR, moderate pulmonary hypertension.  Patient is seen by palliative care services, discussed with the patient and daughter and they would like services when discharged.  Seen by tele endocrinology.  Reported that dm 2 on Amaryl with severe life-threatening hypoglycemia, most likely in the setting of Amaryl use with bad renal functions, no further episodes seen in the hospital.  Cortisol and thyroid  functions within normal limits.  Patient did not have hypoglycemia last night, making insulin, less likely as cause of hypoglycemia which indicated that Amaryl was likely the cause.  Recommend discontinuing Amaryl, restart Januvia.  See PCP after discharge and discussed other medications for DM2, both Actos and G LP 1 would be good options, patient should not take  a medicine like insulin or Amaryl that can lower blood sugars.  Patient lives at home with her daughter.  She is medically stable for discharge home.      Problem List:  Active Hospital Problems   (*Primary Problem)    Diagnosis    Acute on chronic renal failure    Coronary artery disease, unspecified vessel or lesion type, unspecified whether angina present, unspecified whether native or transplanted heart    Type 2 diabetes mellitus with hypoglycemia    Hypoglycemia    CHF exacerbation ruled out    Secondary hyperparathyroidism of renal origin (CMS HCC)    Diabetes mellitus    Essential hypertension    Chronic kidney disease, stage 4 (severe)  (CMS HCC)     Noted by PETRARCA ROBERT DO  last documented on [id number]         Nutrition:    GUEST TRAY  GUEST TRAY  DIET REGULAR Do you want to initiate MNT Protocol? Yes  GUEST TRAY    Additional clinical characteristics related to nutrition:    - monitor for weight changes   - monitor intake and output    - monitor bowel functions                    PHYSICAL EXAM   DAY OF DISCHARGE:    BP 126/75   Pulse 77   Temp 36.3 C (97.4 F)   Resp 18   Ht 1.6 m (5' 3)   Wt 79.7 kg (175 lb 9.6 oz)   SpO2 94%   BMI 31.11 kg/m          General:  Patient is resting in bed, no acute distress, alert and oriented x3   Eyes:  PERRL, no scleral icterus   HENT:  Normocephalic, atraumatic, oral mucosa is moist and pink, no nasal discharge   Heart:  RRR, S1 and S2 auscultated, no murmurs appreciated   Lungs:  Unlabored respirations.  Lungs are clear to auscultation bilaterally, no wheezes, no rales  Abdomen:  Soft, active bowel sounds, non-tender to palpation, non-distended  Extremities:  Pulses equal in all extremities bilaterally.  Capillary refill less than 3 seconds.  No edema in lower extremities bilaterally   Skin:  Warm and dry.  Not diaphoretic  Neuro:  A&O x 3.  No focal deficits.  Speech intact.  Not tremulous  Psych:  Cooperative, not agitated    LABS:  CBC with Diff (Last 48 Hours):    Recent Results in last 48 hours     01/12/24  0553 01/13/24  0657   WBC 6.5 5.1   HGB 13.0 12.5   HCT 39.4 38.3   MCV 86.1 87.2   PLTCNT 166 157   PMNS 49  --    LYMPHOCYTES 35  --    MONOCYTES 14*  --    EOSINOPHIL 1  --    BASOPHILS 0  0.00  --           Last BMP  (Last result in the past 48 hours)        Na   K   Cl   CO2   BUN   Cr   Calcium   Glucose   Glucose-Fasting        01/13/24 0657 142   4.8   99   35   41   1.89   9.3   116  Last Hepatic Panel  (Last result in the past 48 hours)        Albumin   Total PTN   Total Bili   Direct Bili   Ast/SGOT   Alt/SGPT   Alk Phos        01/12/24 0553 3.8   7.3   0.4      16   17    87                BMP (Last 48 Hours):    Recent Results in last 48 hours     01/12/24  0553 01/13/24  0657   SODIUM 142 142   POTASSIUM 5.6* 4.8   CHLORIDE 97* 99   CO2 37* 35*   BUN 40* 41*   CREATININE 2.05* 1.89*   CALCIUM 9.6 9.3   GLUCOSENF 104 116*          DISCHARGE MEDICATIONS:     Current Discharge Medication List        START taking these medications.        Details   SITagliptin phosphate 50 mg Tablet  Commonly known as: JANUVIA   50 mg, Oral, Daily  Refills: 0            CONTINUE these medications which have CHANGED during your visit.        Details   magnesium  oxide 400 mg Tablet  Commonly known as: MAG-OX  What changed: when to take this   400 mg, Oral, 2 TIMES DAILY  Qty: 14 Tablet  Refills: 0            CONTINUE these medications - NO CHANGES were made during your visit.        Details   amLODIPine  10 mg Tablet  Commonly known as: NORVASC   10 mg, Oral, Daily  Qty: 90 Tablet  Refills: 1     aspirin  81 mg Tablet, Delayed Release (E.C.)  Commonly known as: ECOTRIN   81 mg, Daily  Refills: 0     calcitrioL  0.25 mcg Capsule  Commonly known as: ROCALTROL    0.25 mcg, Oral, Daily  Qty: 90 Capsule  Refills: 0     Colchicine-Probenecid 500-0.5 mg Tablet   1 Tablet, EVERY EVENING  Refills: 0     cyanocobalamin  1,000 mcg Tablet  Commonly known as: VITAMIN B 12   1,000 mcg, Daily  Refills: 0     ergocalciferol  (vitamin D2) 1,250 mcg (50,000 unit) Capsule  Commonly known as: DRISDOL   50,000 Units, EVERY 7 DAYS  Refills: 0     esomeprazole magnesium  40 mg Capsule, Delayed Release(E.C.)  Commonly known as: NEXIUM   40 mg, DAILY PRN  Refills: 0     ezetimibe  10 mg Tablet  Commonly known as: ZETIA    10 mg, Oral, EVERY MORNING  Qty: 90 Tablet  Refills: 1     hydroCHLOROthiazide 12.5 mg Tablet  Commonly known as: HYDRODIURIL   12.5 mg, DAILY  Refills: 0     levothyroxine 75 mcg Tablet  Commonly known as: SYNTHROID   75 mcg, EVERY MORNING  Refills: 0     olmesartan  40 mg Tablet  Commonly known as: BENICAR     40 mg, Daily  Refills: 0            STOP taking these medications.      glimepiride 1 mg Tablet  Commonly known as: AMARYL     glimepiride 2 mg Tablet  Commonly known as: AMARYL  pravastatin 10 mg Tablet  Commonly known as: PRAVACHOL     telmisartan  80 mg Tablet  Commonly known as: MICARDIS                  DISCHARGE DISPOSITION:  Home    DISCHARGE INSTRUCTIONS:     No discharge procedures on file.             Copies sent to Care Team         Relationship Specialty Notifications Start End    Bhasin, Sunita, MD PCP - General INTERNAL MEDICINE  10/28/21     Phone: [phone] Fax: [phone]         [address] [place] [address]              Halford Levels, FNP-BC  [place] MEDICINE HOSPITALIST    Discharge and follow-up plan discussed with patient and family who verbalized understanding and agreed.  Time spent, 39 minutes.    Americo Justice MD.  [facility] MEDICINE HOSPITALIST

## 2024-01-13 NOTE — Telemedicine Consult (Signed)
 Endocrinology Telemedicine Consult Follow-Up Note      Telemedicine Documentation:  Patient Location: [facility]  Patient/family aware of provider location: yes  Patient/family consent for telemedicine: yes  Examination observed and performed by: Eudora Heron, MD  Provider's Physical Location: [facility]    Today's Date:       01/13/2024  Encounter Start Date:      01/08/2024  Inpatient Admission Date:  01/08/2024  Name:             Connie Barber  Connie Barber  Age:             89 y.o.  Attending:             Mulindwa, Deo, MD  PCP             Temple Feeler, MD      Reason for Consult: Hypoglycemia  Chief Complaint:  Hypoglycemia    Subjective:  Patient felt well overnight, had no repeated episodes of hypoglycemia, agreeable tp seeing her PCP after discharge and stopping her Amaryl.     Current Facility-Administered Medications   Medication Dose Route Frequency Provider Last Rate Last Admin    amLODIPine  (NORVASC) tablet  10 mg Oral Daily Halford Levels, FNP-BC   10 mg at 01/13/24 1610    aspirin  chewable tablet 81 mg  81 mg Oral Daily Halford Levels, FNP-BC   81 mg at 01/13/24 9604    [Held by provider] D10W premixed infusion   Intravenous Continuous Mulindwa, Deo, MD   Stopped at 01/11/24 5409    dextrose  (GLUTOSE) 40% oral gel  15 g Oral Q15 Min PRN Niki Barter, PA-C        dextrose  50% (0.5 g/mL) injection - syringe  12.5 g Intravenous Q15 Min PRN Niki Barter, PA-C   50 mL at 01/08/24 1841    dextrose  50% (0.5 g/mL) injection - syringe  12.5 g Intravenous Now Mulindwa, Deo, MD        glucagon injection 1 mg  1 mg IntraMUSCULAR Once PRN Niki Barter, PA-C        heparin  5,000 unit/mL injection  5,000 Units Subcutaneous Q8HRS Niki Barter, PA-C   5,000 Units at 01/13/24 0556    levothyroxine (SYNTHROID) tablet 75 mcg  75 mcg Oral QAM Halford Levels, FNP-BC   75 mcg at 01/13/24 0600    pantoprazole  (PROTONIX ) delayed release tablet  40 mg Oral Daily Halford Levels,  FNP-BC   40 mg at 01/13/24 0556    probenecid-colchicine 500-0.5mg  tablet  1 Tablet Oral Daily with Breakfast Mulindwa, Deo, MD        sodium zirconium cyclosilicate (LOKELMA) powder  10 g Oral 3x/day Mulindwa, Deo, MD   10 g at 01/13/24 8119     Objective:  Temperature: 36.5 C (97.7 F)  Heart Rate: 63  BP (Non-Invasive): 134/60  Respiratory Rate: 16  SpO2: 93 %  Full Exam Limited as Encounter is Virtual  General: Well appearing female in no acute distress.  Psychiatric: Normal mood and affect  Neuro: Alert and oriented x 3. Speech fluent. Cranial nerves grossly II-XII intact.   HEENT: Head normocephalic, atraumatic. EOMI, conjunctiva clear. No proptosis.  Thyroid /Neck: Trachea midline.   Lungs: Normal respiratory effort.   Extremities: No edema or erythema noted.  Skin: No visible rashes.      Labs:  Reviewed  Lab Results   Component Value Date    HA1C 6.8 (H) 09/14/2023        BMP (Last 24  Hours):    Recent Results last 24 hours     01/13/24  0657   SODIUM 142   POTASSIUM 4.8   CHLORIDE 99   CO2 35*   BUN 41*   CREATININE 1.89*   CALCIUM 9.3   GLUCOSENF 116*         CBC with Diff (Last 24 Hours):    Recent Results last 24 hours     01/13/24  0657   WBC 5.1   HGB 12.5   HCT 38.3   MCV 87.2   PLTCNT 157         Recent Labs     01/11/24  1135 01/11/24  1251 01/11/24  1428 01/11/24  1611 01/11/24  2003 01/12/24  0236 01/12/24  0530 01/12/24  1006 01/12/24  1407 01/12/24  1807 01/12/24  2112 01/13/24  0033 01/13/24  0408 01/13/24  0757   GLUIP 176* 166* 276* 212* 185* 148* 113* 263* 172* 140* 254* 134* 92 116*       Lab Results   Component Value Date    TSH 1.602 12/04/2023       Imaging Studies:  Reviewed      Assessment/Recommendations:  Connie  MELINE Barber is a 89 y.o. female with PMH of chronic kidney disease, type 2 diabetes, hyperlipidemia admitted for hypoglycemia likely secondary to Amaryl use in the setting of worsening renal functions. Endocrinology is consulted for hypoglycemia.   DM2 on Amaryl with Severe  life-threatening hypoglycemia  -most likely in the setting of Amaryl use with bad renal functions no further episodes seen in the hospital  -cortisol and thyroid  functions within normal levels  -sulfonylurea screen, IGF-1 and IGF 2 levels pending  -Patient did not have hypoglycemia last night, making insulinoma less likely as a cause of hypoglycemia, and indicated that Amaryl was likely the cause  -For DC: recommend discontinuing Amaryl  -Restart Januvia  -Patient to see PCP after DC and discuss other medications for DM2, both actos or low dose GLP-1 would be good options, patient should not take a medicine like insulin or Amaryl that can lower blood sugars     Recommendations discussed with primary team, PRN HOSPITALIST 1.     Thank you for this consult. We will sign off at this time. Please page with questions.    On the day of the encounter, a total of  30 minutes were spent on this patient encounter including review of historical information, examination, and documentation.    Eudora Heron, MD       I saw and examined the patient. I reviewed the fellow's note. I agree with the findings and plan of care as documented in the fellow's note. Any exceptions/additions are edited/noted.     Sherolyn Dixon , MD  Assistant Professor  Endocrinology & Metabolism  [employer] [facility]    01/13/2024 13:25

## 2024-01-13 NOTE — Nurses Notes (Signed)
Patient discharged home.  Telemetry monitor and IV removed.  Continuous pulse oximetry discontinued.  AVS reviewed with patient.  A written copy of the AVS and discharge instructions was given to the patient.  Questions sufficiently answered as needed.  Patient encouraged to follow up with PCP as indicated.  In the event of an emergency, patient instructed to call 911 or go to the nearest emergency room.  Patient taken to lobby in wheelchair and assisted into vehicle.     Orma Cheetham, RN

## 2024-01-13 NOTE — Care Management Notes (Signed)
 [facility]  Care Management Initial Evaluation    Patient Name: Connie Barber  Date of Birth: 1934/11/21  Sex: female  Date/Time of Admission: 01/08/2024 12:00 PM  Room/Bed: 214/A  Payor: HUMANA MEDICARE / Plan: HUMANA CHOICE PPO / Product Type: PPO /   Primary Care Providers:  Bhasin, Sunita, MD, MD (General)    Pharmacy Info:   Preferred Pharmacy       [id number] - [place], [place] - [address]    [address] [place] [address]    Phone: [phone] Fax: [phone]    Hours: Not open 24 hours          Emergency Contact Info:   Extended Emergency Contact Information  Primary Emergency Contact: COLLINS,VICTORIA  Mobile Phone: [phone]  Relation: Daughter  Preferred language: English  Interpreter needed? No    History:   Connie  LINDZY Barber is a 89 y.o., female, admitted to [facility] on 01/08/24.    Height/Weight: 160 cm (5' 3) / 79.7 kg (175 lb 9.6 oz)     LOS: 5 days   Admitting Diagnosis: Hypoglycemia [E16.2]    Assessment:      01/13/24 1110   Insurance Information/Type   Insurance type [employer] Environment   Lives With child(ren), dependent  (Patient lives with her daughter, Connie Barber.)   Living Arrangements house   Able to Return to Prior Arrangements yes   Living Arrangement Comments Patient has a two story home.  She has 14 steps inside the home and 15 steps outside of the home.   Home Safety   Home Assessment: Stairs in Home   Home Accessibility bed not on first floor   Care Management Plan   Discharge Planning Status initial meeting   Discharge plan discussed with: Patient;Other (Comment)  (daughter, [place])   CM will evaluate for rehabilitation potential yes   Discharge Needs Assessment   Equipment Currently Used at Home cane, straight;glucometer   Equipment Needed After Discharge none   Discharge Facility/Level of Care Needs Home (Patient/Family Member/other)(code 1)   Transportation Available family or friend will provide;other (see  comments)  (Patient states she is able to drive.)   ADVANCE DIRECTIVES   Does the Patient have an Advance Directive? Yes, Patient Does Have Advance Directive for Healthcare Treatment   Copy of Advance Directives in Chart? Yes, Copy on Chart.(Specify in Comment Which Advance Directive)         Discharge Plan:  Home (Patient/Family Member/other) (code 1)  Met with patient and daughter, Connie Barber, to discuss DC plans.  Patient advised that she lives at home with her daughter, Connie Barber.  She has a two story home and her bedroom is on the second floor.  Patient advised that she walks independently within the home.  She has a straight cane in her car that she uses when out.  Patient voiced no difficulty obtaining her medications and advised that she uses a pill box for her meds.  At this time, patient expresses no needs or barriers to returning home.  She did choose to complete a MPOA.  A copy was placed on the chart.     The patient will continue to be evaluated for developing discharge needs.     Case Manager: Lexie Redden, MSW  Phone: [phone]

## 2024-01-15 LAB — INSULIN-LIKE GROWTH FACTOR 1, SERUM
IGF-1, LCMS: 42 ng/mL (ref 34–246)
Z-SCORE (FEMALE): -1.6 {STDV} (ref ?–2.0)

## 2024-01-21 ENCOUNTER — Ambulatory Visit (HOSPITAL_BASED_OUTPATIENT_CLINIC_OR_DEPARTMENT_OTHER): Payer: Self-pay | Admitting: INTERNAL MEDICINE-ENDOCRINOLOGY-DIABETES AND METABOLISM

## 2024-01-22 ENCOUNTER — Other Ambulatory Visit: Payer: Self-pay

## 2024-01-22 ENCOUNTER — Inpatient Hospital Stay
Admission: EM | Admit: 2024-01-22 | Discharge: 2024-01-26 | DRG: 683 | Disposition: A | Discharge status: Home Health | Attending: HOSPITALIST | Admitting: HOSPITALIST

## 2024-01-22 ENCOUNTER — Emergency Department (HOSPITAL_BASED_OUTPATIENT_CLINIC_OR_DEPARTMENT_OTHER)

## 2024-01-22 ENCOUNTER — Encounter (HOSPITAL_BASED_OUTPATIENT_CLINIC_OR_DEPARTMENT_OTHER): Payer: Self-pay

## 2024-01-22 DIAGNOSIS — N179 Acute kidney failure, unspecified: Principal | ICD-10-CM | POA: Diagnosis present

## 2024-01-22 DIAGNOSIS — E7800 Pure hypercholesterolemia, unspecified: Secondary | ICD-10-CM | POA: Diagnosis present

## 2024-01-22 DIAGNOSIS — I5032 Chronic diastolic (congestive) heart failure: Secondary | ICD-10-CM | POA: Diagnosis present

## 2024-01-22 DIAGNOSIS — I129 Hypertensive chronic kidney disease with stage 1 through stage 4 chronic kidney disease, or unspecified chronic kidney disease: Secondary | ICD-10-CM

## 2024-01-22 DIAGNOSIS — N183 Chronic kidney disease, stage 3 unspecified: Principal | ICD-10-CM

## 2024-01-22 DIAGNOSIS — R609 Edema, unspecified: Secondary | ICD-10-CM

## 2024-01-22 DIAGNOSIS — E1122 Type 2 diabetes mellitus with diabetic chronic kidney disease: Secondary | ICD-10-CM | POA: Diagnosis present

## 2024-01-22 DIAGNOSIS — Z87891 Personal history of nicotine dependence: Secondary | ICD-10-CM

## 2024-01-22 DIAGNOSIS — I272 Pulmonary hypertension, unspecified: Secondary | ICD-10-CM | POA: Diagnosis present

## 2024-01-22 DIAGNOSIS — I509 Heart failure, unspecified: Secondary | ICD-10-CM

## 2024-01-22 DIAGNOSIS — R519 Headache, unspecified: Secondary | ICD-10-CM

## 2024-01-22 DIAGNOSIS — N189 Chronic kidney disease, unspecified: Secondary | ICD-10-CM

## 2024-01-22 DIAGNOSIS — Z7982 Long term (current) use of aspirin: Secondary | ICD-10-CM

## 2024-01-22 DIAGNOSIS — N271 Small kidney, bilateral: Secondary | ICD-10-CM | POA: Diagnosis present

## 2024-01-22 DIAGNOSIS — E039 Hypothyroidism, unspecified: Secondary | ICD-10-CM | POA: Diagnosis present

## 2024-01-22 DIAGNOSIS — Z79899 Other long term (current) drug therapy: Secondary | ICD-10-CM

## 2024-01-22 DIAGNOSIS — Z7984 Long term (current) use of oral hypoglycemic drugs: Secondary | ICD-10-CM

## 2024-01-22 DIAGNOSIS — E669 Obesity, unspecified: Secondary | ICD-10-CM | POA: Diagnosis present

## 2024-01-22 DIAGNOSIS — I2510 Atherosclerotic heart disease of native coronary artery without angina pectoris: Secondary | ICD-10-CM | POA: Diagnosis present

## 2024-01-22 DIAGNOSIS — R079 Chest pain, unspecified: Secondary | ICD-10-CM

## 2024-01-22 DIAGNOSIS — N184 Chronic kidney disease, stage 4 (severe): Secondary | ICD-10-CM | POA: Diagnosis not present

## 2024-01-22 DIAGNOSIS — E213 Hyperparathyroidism, unspecified: Secondary | ICD-10-CM | POA: Diagnosis present

## 2024-01-22 DIAGNOSIS — Z6829 Body mass index (BMI) 29.0-29.9, adult: Secondary | ICD-10-CM

## 2024-01-22 DIAGNOSIS — N1832 Chronic kidney disease, stage 3b: Secondary | ICD-10-CM | POA: Diagnosis present

## 2024-01-22 DIAGNOSIS — I130 Hypertensive heart and chronic kidney disease with heart failure and stage 1 through stage 4 chronic kidney disease, or unspecified chronic kidney disease: Secondary | ICD-10-CM | POA: Diagnosis present

## 2024-01-22 DIAGNOSIS — Z8619 Personal history of other infectious and parasitic diseases: Secondary | ICD-10-CM

## 2024-01-22 DIAGNOSIS — Z79890 Hormone replacement therapy (postmenopausal): Secondary | ICD-10-CM

## 2024-01-22 DIAGNOSIS — I10 Essential (primary) hypertension: Secondary | ICD-10-CM

## 2024-01-22 DIAGNOSIS — N170 Acute kidney failure with tubular necrosis: Principal | ICD-10-CM | POA: Diagnosis present

## 2024-01-22 LAB — URINALYSIS, MACRO/MICRO
BILIRUBIN: NEGATIVE mg/dL
BLOOD: NEGATIVE mg/dL
GLUCOSE: NEGATIVE mg/dL
KETONES: NEGATIVE mg/dL
NITRITE: NEGATIVE
PH: 6.5 (ref 4.6–8.0)
PROTEIN: NEGATIVE mg/dL
SPECIFIC GRAVITY: 1.01 (ref 1.003–1.035)
UROBILINOGEN: 0.2 mg/dL (ref 0.2–1.0)

## 2024-01-22 LAB — COMPREHENSIVE METABOLIC PANEL, NON-FASTING
ALBUMIN/GLOBULIN RATIO: 0.9 (ref 0.8–1.4)
ALBUMIN: 3.5 g/dL (ref 3.4–5.0)
ALKALINE PHOSPHATASE: 82 U/L (ref 46–116)
ALT (SGPT): 23 U/L (ref <=78)
ANION GAP: 8 mmol/L (ref 4–13)
AST (SGOT): 20 U/L (ref 15–37)
BILIRUBIN TOTAL: 0.6 mg/dL (ref 0.2–1.0)
BUN/CREA RATIO: 19
BUN: 84 mg/dL — ABNORMAL HIGH (ref 7–18)
CALCIUM, CORRECTED: 9.6 mg/dL
CALCIUM: 9.2 mg/dL (ref 8.5–10.1)
CHLORIDE: 104 mmol/L (ref 98–107)
CO2 TOTAL: 30 mmol/L (ref 21–32)
CREATININE: 4.44 mg/dL — ABNORMAL HIGH (ref 0.55–1.02)
ESTIMATED GFR: 9 mL/min/{1.73_m2} — ABNORMAL LOW (ref >59)
GLOBULIN: 4.1
GLUCOSE: 115 mg/dL — ABNORMAL HIGH (ref 74–106)
OSMOLALITY, CALCULATED: 310 mosm/kg — ABNORMAL HIGH (ref 270–290)
POTASSIUM: 4.8 mmol/L (ref 3.5–5.1)
PROTEIN TOTAL: 7.6 g/dL (ref 6.4–8.2)
SODIUM: 142 mmol/L (ref 136–145)

## 2024-01-22 LAB — CBC WITH DIFF
BASOPHIL #: 0.02 10*3/uL (ref 0.00–0.10)
BASOPHIL %: 0 % (ref 0–1)
EOSINOPHIL #: 0.11 10*3/uL (ref 0.00–0.50)
EOSINOPHIL %: 2 % (ref 1–7)
HCT: 37 % (ref 31.2–41.9)
HGB: 12.3 g/dL (ref 10.9–14.3)
LYMPHOCYTE #: 2.02 10*3/uL (ref 1.10–3.10)
LYMPHOCYTE %: 39 % (ref 16–46)
MCH: 30 pg (ref 24.7–32.8)
MCHC: 33.2 g/dL (ref 32.3–35.6)
MCV: 90.3 fL (ref 75.5–95.3)
MONOCYTE #: 0.61 10*3/uL (ref 0.20–0.90)
MONOCYTE %: 12 % — ABNORMAL HIGH (ref 4–11)
MPV: 10.5 fL (ref 7.9–10.8)
NEUTROPHIL #: 2.38 10*3/uL (ref 1.90–8.20)
NEUTROPHIL %: 46 % (ref 43–77)
PLATELETS: 164 10*3/uL (ref 140–440)
RBC: 4.1 10*6/uL (ref 3.63–4.92)
RDW: 17.2 % (ref 12.3–17.7)
WBC: 5.2 10*3/uL (ref 3.8–11.8)

## 2024-01-22 LAB — LIPASE: LIPASE: 24 U/L (ref 16–77)

## 2024-01-22 LAB — TROPONIN-I
TROPONIN I: 13 ng/L (ref <15)
TROPONIN I: 13 ng/L (ref <15)
TROPONIN I: 15 ng/L — ABNORMAL HIGH (ref <15)

## 2024-01-22 LAB — NT-PROBNP: NT-PROBNP: 467 pg/mL — ABNORMAL HIGH (ref <=450)

## 2024-01-22 LAB — ECG 12 LEAD
Atrial Rate: 51 {beats}/min
Calculated P Axis: 69 degrees
Calculated R Axis: -27 degrees
PR Interval: 172 ms
QRS Duration: 104 ms
QT Interval: 448 ms
Ventricular rate: 51 {beats}/min

## 2024-01-22 LAB — PT/INR
INR: 1.12 — ABNORMAL HIGH (ref 0.84–1.10)
PROTHROMBIN TIME: 12.7 s (ref 9.8–12.7)

## 2024-01-22 LAB — PTT (PARTIAL THROMBOPLASTIN TIME): APTT: 25.5 s (ref 25.0–38.0)

## 2024-01-22 LAB — URINALYSIS, MICROSCOPIC

## 2024-01-22 LAB — POC BLOOD GLUCOSE (RESULTS)
GLUCOSE, POC: 96 mg/dL (ref 70–100)
GLUCOSE, POC: 108 mg/dL — ABNORMAL HIGH (ref 70–100)

## 2024-01-22 LAB — MAGNESIUM: MAGNESIUM: 2.9 mg/dL — ABNORMAL HIGH (ref 1.8–2.4)

## 2024-01-22 MED ORDER — DOCUSATE SODIUM 100 MG CAPSULE
100.0000 mg | ORAL_CAPSULE | Freq: Two times a day (BID) | ORAL | Status: DC | PRN
Start: 2024-01-22 — End: 2024-01-26

## 2024-01-22 MED ORDER — CYANOCOBALAMIN (VIT B-12) 250 MCG TABLET
1000.0000 ug | ORAL_TABLET | Freq: Every day | ORAL | Status: DC
Start: 2024-01-22 — End: 2024-01-26
  Administered 2024-01-22 – 2024-01-26 (×5): 1000 ug via ORAL
  Filled 2024-01-22 (×5): qty 4

## 2024-01-22 MED ORDER — HEPARIN (PORCINE) 5,000 UNIT/ML INJECTION SOLUTION
5000.0000 [IU] | Freq: Three times a day (TID) | INTRAMUSCULAR | Status: DC
Start: 2024-01-22 — End: 2024-01-26
  Administered 2024-01-22 – 2024-01-26 (×11): 5000 [IU] via SUBCUTANEOUS
  Administered 2024-01-26: 0 [IU] via SUBCUTANEOUS
  Filled 2024-01-22 (×9): qty 1

## 2024-01-22 MED ORDER — INSULIN LISPRO 100 UNIT/ML SUB-Q SSIP VIAL
1.0000 [IU] | INJECTION | Freq: Four times a day (QID) | SUBCUTANEOUS | Status: DC
Start: 2024-01-22 — End: 2024-01-26
  Administered 2024-01-22 – 2024-01-26 (×15): 0 [IU] via SUBCUTANEOUS

## 2024-01-22 MED ORDER — CALCITRIOL 0.25 MCG CAPSULE
0.2500 ug | ORAL_CAPSULE | Freq: Every day | ORAL | Status: DC
Start: 2024-01-22 — End: 2024-02-22
  Administered 2024-01-22 – 2024-01-26 (×5): 0.25 ug via ORAL
  Filled 2024-01-22 (×5): qty 1

## 2024-01-22 MED ORDER — ACETAMINOPHEN 325 MG TABLET
650.0000 mg | ORAL_TABLET | ORAL | Status: DC | PRN
Start: 2024-01-22 — End: 2024-01-26
  Administered 2024-01-25: 650 mg via ORAL
  Filled 2024-01-22: qty 2

## 2024-01-22 MED ORDER — LEVOTHYROXINE 50 MCG TABLET
75.0000 ug | ORAL_TABLET | Freq: Every morning | ORAL | Status: DC
Start: 2024-01-23 — End: 2024-01-26
  Administered 2024-01-23 – 2024-01-26 (×4): 75 ug via ORAL
  Filled 2024-01-22 (×5): qty 1

## 2024-01-22 MED ORDER — LOSARTAN 50 MG TABLET
50.0000 mg | ORAL_TABLET | Freq: Every day | ORAL | Status: DC
Start: 2024-01-22 — End: 2024-01-26
  Administered 2024-01-22 – 2024-01-26 (×2): 50 mg via ORAL
  Filled 2024-01-22 (×2): qty 1

## 2024-01-22 MED ORDER — ALUMINUM-MAG HYDROXIDE-SIMETHICONE 200 MG-200 MG-20 MG/5 ML ORAL SUSP
30.0000 mL | ORAL | Status: DC | PRN
Start: 2024-01-22 — End: 2024-01-26

## 2024-01-22 MED ORDER — SODIUM CHLORIDE 0.9 % IV BOLUS
500.0000 mL | INJECTION | Status: AC
Start: 2024-01-22 — End: 2024-01-22
  Administered 2024-01-22: 500 mL via INTRAVENOUS
  Administered 2024-01-22: 0 mL via INTRAVENOUS

## 2024-01-22 MED ORDER — ONDANSETRON HCL (PF) 4 MG/2 ML INJECTION SOLUTION
4.0000 mg | Freq: Four times a day (QID) | INTRAMUSCULAR | Status: DC | PRN
Start: 2024-01-22 — End: 2024-01-26
  Administered 2024-01-24: 4 mg via INTRAVENOUS
  Filled 2024-01-22: qty 2

## 2024-01-22 MED ORDER — PANTOPRAZOLE 40 MG TABLET,DELAYED RELEASE
40.0000 mg | DELAYED_RELEASE_TABLET | Freq: Every day | ORAL | Status: DC
Start: 2024-01-22 — End: 2024-01-26
  Administered 2024-01-22 – 2024-01-26 (×5): 40 mg via ORAL
  Filled 2024-01-22 (×5): qty 1

## 2024-01-22 MED ORDER — SODIUM CHLORIDE 0.9 % INTRAVENOUS SOLUTION
INTRAVENOUS | Status: DC
Start: 2024-01-22 — End: 2024-01-23
  Administered 2024-01-23: 0 mL via INTRAVENOUS

## 2024-01-22 MED ORDER — ACETAMINOPHEN 325 MG TABLET
ORAL_TABLET | ORAL | Status: AC
Start: 2024-01-22 — End: 2024-01-22
  Filled 2024-01-22: qty 3

## 2024-01-22 MED ORDER — SODIUM CHLORIDE 0.9 % INTRAVENOUS SOLUTION
INTRAVENOUS | Status: DC
Start: 2024-01-22 — End: 2024-01-22
  Administered 2024-01-22: 0 mL via INTRAVENOUS

## 2024-01-22 MED ORDER — ASPIRIN 81 MG CHEWABLE TABLET
81.0000 mg | CHEWABLE_TABLET | Freq: Every day | ORAL | Status: DC
Start: 2024-01-22 — End: 2024-01-26
  Administered 2024-01-22 – 2024-01-26 (×5): 81 mg via ORAL
  Filled 2024-01-22 (×5): qty 1

## 2024-01-22 MED ORDER — ACETAMINOPHEN 325 MG TABLET
975.0000 mg | ORAL_TABLET | ORAL | Status: AC
Start: 2024-01-22 — End: 2024-01-22
  Administered 2024-01-22: 975 mg via ORAL

## 2024-01-22 NOTE — ED Nurses Note (Signed)
 Pt admitted to [facility] main campus room 316 B. Report called to Wandalee Shay RN Pt transported via [facility]. NS at 125 continues to infuse upon transport.

## 2024-01-22 NOTE — ED Nurses Note (Signed)
 NP Bain in. Pt and family informed of admission. Updated on Status. Voices understanding.

## 2024-01-22 NOTE — H&P (Signed)
 [place] MEDIC[facility]    HOSPITALIST H&P    Suttyn  AMBRE KOBAYASHI 89 y.o. female 316/B   Date of Service: 01/22/2024    Date of Admission:  01/22/2024   PCP: Ashby Lansing, MD Code Status:Prior       Chief Complaint:  Headache, dizziness  HPI:   This is a 89 year old female who presented to [facility] emergency department with complaint of headache dizziness nausea and bilateral lower extremity swelling.  Patient was recently admitted to [facility] with suspected hypoglycemia and diastolic CHF exacerbation.  She does has history of hypertension diabetes and chronic kidney disease.  Patient was evaluated in [facility] emergency department and was found to be in acute renal failure on top of chronic kidney disease.  She was subsequently transferred to main campus for possible admission.      ED medications:   Medications Ordered/Administered in the ED   acetaminophen  (TYLENOL ) tablet (975 mg Oral Given 01/22/24 1218)   NS bolus infusion 500 mL (has no administration in time range)         PMHx:    Past Medical History:   Diagnosis Date    CKD (chronic kidney disease)     Stage 3b    Coronary artery disease     Diabetes mellitus     Dyslipidemia     Essential hypertension     Hepatitis C     Wears dentures     Wears glasses         PSHx:   Past Surgical History:   Procedure Laterality Date    CARDIAC CATHETERIZATION  11/20/2014    HX APPENDECTOMY      HX HYSTERECTOMY            Allergies:    Allergies[1] Social History  Social History[2]    Family History  Family Medical History:       Problem Relation (Age of Onset)    Heart Disease Mother    Hypertension (High Blood Pressure) Mother    No Known Problems Sister, Brother, Maternal Grandmother, Maternal Grandfather, Paternal Grandmother, Paternal Grandfather, Daughter, Son, Maternal Aunt, Maternal Uncle, Paternal Aunt, Paternal Uncle, Other    Stroke Father               Home Meds:      Prior to Admission medications    Medication Sig  Start Date End Date Taking? 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 11/24/23 02/22/24  Cathryne Iha, NP   Colchicine -Probenecid  500-0.5 mg Oral Tablet Take 1 Tablet by mouth Every evening    Provider, Historical   cyanocobalamin  (VITAMIN B 12) 1,000 mcg Oral Tablet Take 1 Tablet (1,000 mcg total) by mouth Daily    Provider, Historical   ergocalciferol , vitamin D2, (DRISDOL) 1,250 mcg (50,000 unit) Oral Capsule Take 1 Capsule (50,000 Units total) by mouth Every 7 days    Provider, Historical   esomeprazole magnesium  (NEXIUM) 40 mg Oral Capsule, Delayed Release(E.C.) Take 1 Capsule (40 mg total) by mouth Once per day as needed for Other  Provider, Historical   ezetimibe  (ZETIA ) 10 mg Oral Tablet Take 1 Tablet (10 mg total) by mouth Every morning for 180 days 11/19/21 01/09/24  Christ, April, FNP-C   hydroCHLOROthiazide (HYDRODIURIL) 12.5 mg Oral Tablet Take 1 Tablet (12.5 mg total) by mouth Once a day 08/05/22   Provider, Historical   levothyroxine  (SYNTHROID ) 75 mcg Oral Tablet Take 1 Tablet (75 mcg total) by mouth Every morning    Provider, Historical   magnesium  oxide (MAG-OX) 400 mg Oral Tablet Take 1 Tablet (400 mg total) by mouth Twice daily for 7 days 01/11/24 01/18/24  Mulindwa, Deo, MD   olmesartan  (BENICAR ) 40 mg Oral Tablet Take 1 Tablet (40 mg total) by mouth Daily    Provider, Historical   SITagliptin phosphate (JANUVIA) 50 mg Oral Tablet Take 1 Tablet (50 mg total) by mouth Daily    Provider, Historical   glimepiride (AMARYL) 1 mg Oral Tablet Take 1 Tablet (1 mg total) by mouth Every morning  with breakfast  Patient not taking: Reported on 01/08/2024  01/13/24  Provider, Historical   glimepiride (AMARYL) 2 mg Oral Tablet Take 1 Tablet (2 mg total) by mouth Every morning with breakfast  Patient not taking: Reported on 01/08/2024  01/13/24  Provider, Historical   magnesium  oxide (MAG-OX) 400 mg Oral Tablet Take 1 Tablet (400 mg total) by mouth Daily for 90 days Indications: low amount of magnesium  in the blood  Patient not taking: Reported on 01/08/2024 11/24/23 01/13/24  Cathryne Iha, NP   pravastatin (PRAVACHOL) 10 mg Oral Tablet Take 1 Tablet (10 mg total) by mouth Every morning  Patient not taking: Reported on 01/09/2024  01/13/24  Provider, Historical   telmisartan  (MICARDIS ) 80 mg Oral Tablet Take 1 Tablet (80 mg total) by mouth Daily for 90 days  Patient not taking: Reported on 01/09/2024 11/24/23 01/13/24  Cathryne Iha, NP          ROS:   General: No fever or chills. No weight changes, fatigue, weakness.   HEENT: No headaches, dizziness, changes in vision, changes in hearing, or difficulty swallowing.    Skin:  No rashes, erythema or bruises.   Cardiac: No chest pain, palpitations, or arrhythmia.    Respiratory: No shortness of breath, cough, or wheezing.  GI: No nausea or vomiting. No abdominal pain.   Urinary: No dysuria, hematuria, or change in frequency.    Vascular: No edema.     Musculoskeletal: No muscle weakness, pain, or decreased range of motion.   Neurologic: No loss of sensation, numbness or tingling.   Endocrine: No heat or cold intolerance or polydipsia.   Psychiatric: No insomnia, depression or anxiety.      Results for orders placed or performed during the hospital encounter of 01/22/24 (from the past 24 hours)   COMPREHENSIVE METABOLIC PANEL, NON-FASTING   Result Value Ref Range    SODIUM 142 136 - 145 mmol/L    POTASSIUM 4.8 3.5 - 5.1 mmol/L    CHLORIDE 104 98 - 107 mmol/L    CO2 TOTAL 30 21 - 32 mmol/L    ANION GAP 8 4 - 13 mmol/L    BUN 84 (H) 7 - 18 mg/dL    CREATININE 5.55 (H)  0.55 - 1.02 mg/dL    BUN/CREA RATIO 19     ESTIMATED GFR 9 (L) >59 mL/min/1.59m^2    ALBUMIN 3.5 3.4 - 5.0 g/dL    CALCIUM 9.2 8.5 - 89.8 mg/dL    GLUCOSE 884 (H) 74 - 106 mg/dL  ALKALINE PHOSPHATASE 82 46 - 116 U/L    ALT (SGPT) 23 <=78 U/L    AST (SGOT) 20 15 - 37 U/L    BILIRUBIN TOTAL 0.6 0.2 - 1.0 mg/dL    PROTEIN TOTAL 7.6 6.4 - 8.2 g/dL    ALBUMIN/GLOBULIN RATIO 0.9 0.8 - 1.4    OSMOLALITY, CALCULATED 310 (H) 270 - 290 mOsm/kg    CALCIUM, CORRECTED 9.6 mg/dL    GLOBULIN 4.1    PT/INR   Result Value Ref Range    PROTHROMBIN TIME 12.7 9.8 - 12.7 seconds    INR 1.12 (H) 0.84 - 1.10   PTT (PARTIAL THROMBOPLASTIN TIME)   Result Value Ref Range    APTT 25.5 25.0 - 38.0 seconds   TROPONIN-I NOW   Result Value Ref Range    TROPONIN I 13 <15 ng/L   CBC WITH DIFF   Result Value Ref Range    WBC 5.2 3.8 - 11.8 x10^3/uL    RBC 4.10 3.63 - 4.92 x10^6/uL    HGB 12.3 10.9 - 14.3 g/dL    HCT 62.9 68.7 - 58.0 %    MCV 90.3 75.5 - 95.3 fL    MCH 30.0 24.7 - 32.8 pg    MCHC 33.2 32.3 - 35.6 g/dL    RDW 82.7 87.6 - 82.2 %    PLATELETS 164 140 - 440 x10^3/uL    MPV 10.5 7.9 - 10.8 fL    NEUTROPHIL % 46 43 - 77 %    LYMPHOCYTE % 39 16 - 46 %    MONOCYTE % 12 (H) 4 - 11 %    EOSINOPHIL % 2 1 - 7 %    BASOPHIL % 0 0 - 1 %    NEUTROPHIL # 2.38 1.90 - 8.20 x10^3/uL    LYMPHOCYTE # 2.02 1.10 - 3.10 x10^3/uL    MONOCYTE # 0.61 0.20 - 0.90 x10^3/uL    EOSINOPHIL # 0.11 0.00 - 0.50 x10^3/uL    BASOPHIL # 0.02 0.00 - 0.10 x10^3/uL   ECG 12 LEAD   Result Value Ref Range    Ventricular rate 51 BPM    Atrial Rate 51 BPM    PR Interval 172 ms    QRS Duration 104 ms    QT Interval 448 ms    QTC Calculation 412 ms    Calculated P Axis 69 degrees    Calculated R Axis -27 degrees    Calculated T Axis -30 degrees   NT-PROBNP   Result Value Ref Range    NT-PROBNP 467 (H) <=450 pg/mL   LIPASE   Result Value Ref Range    LIPASE 24 16 - 77 U/L   MAGNESIUM    Result Value Ref Range    MAGNESIUM  2.9 (H) 1.8 - 2.4 mg/dL   TROPONIN-I IN ONE HOUR   Result  Value Ref Range    TROPONIN I 13 <15 ng/L   TROPONIN-I IN THREE HOURS   Result Value Ref Range    TROPONIN I 15 (H) <15 ng/L   URINALYSIS, MACRO/MICRO   Result Value Ref Range    COLOR Light Yellow Light Yellow, Yellow    APPEARANCE Clear Clear    SPECIFIC GRAVITY 1.010 1.003 - 1.035    PH 6.5 4.6 - 8.0    LEUKOCYTES Trace (A) Negative WBCs/uL    NITRITE Negative Negative    PROTEIN Negative Negative mg/dL    GLUCOSE Negative Negative mg/dL    KETONES Negative Negative mg/dL  BILIRUBIN Negative Negative mg/dL    BLOOD Negative Negative mg/dL    UROBILINOGEN 0.2 0.2 - 1.0 mg/dL   URINALYSIS, MICROSCOPIC   Result Value Ref Range    RBCS Not Present 0-3, Not Present /hpf    BACTERIA Few (A) Negative /hpf    MUCOUS Few Rare, Occasional, Few /hpf    WBCS Occasional Not Present, Occasional, 0-5 /hpf    SQUAMOUS EPITHELIAL Few Not Present, Few /hpf          Physical:  Filed Vitals:    01/22/24 1530 01/22/24 1545 01/22/24 1600 01/22/24 1719   BP: (!) 135/56  (!) 135/58    Pulse: 54 54 58 55   Resp: 20 14 20     Temp:       SpO2: 96% 93% 92%       General: Patient is alert. No acute distress. Communicates appropriately.   Head: Normocephalic and atraumatic.    Eyes: Conjunctiva normal. Sclerae are normal.    Nose: Nasal passages clear.    Throat: Moist oral mucosa.   Neck: Supple. Trachea midline   Heart: Regular rate and rhythm. S1 & S2 present. No S3 or S4. No rubs, gallops, or murmurs appreciated.  Brisk capillary refill.    Lungs: Clear to auscultation bilaterally with no wheezes or rales. Equal chest excursion.  No conversational dyspnea. No respiratory distress noted.   Abdomen: Soft, nontender, nondistended belly. Bowel sounds are present in all four quadrants. No rigidity.  No guarding.  No ascites.   Extremities:  2+ pedal pitting edema, no cyanosis, or clubbing. Grossly moves all extremities.    Skin: Warm and dry without lesions. No ecchymosis noted.    Neurologic: Cranial nerves II through XII are grossly  intact. Sensation to light touch is intact. Strength 5/5 in upper extremities and lower extremities bilaterally.    Genitourinary:  No urinary incontinence or Foley catheter   Psychiatric: Judgment and insight are intact. Mood and affect are appropriate for the situation.       Diagnostic studies:  No results found.         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  She had recent admission where CHF exacerbation and hypoglycemia secondary to sulfonylurea was suspected.  According to family patient's blood sugar drops low at night and she is taking sugary drinks to compensate.  She does not appear to be in acute CHF exacerbation but has component of acute renal failure on top of chronic kidney disease.  She did receive IV Lasix  being recent hospitalization which could have contributed to declining renal function as well.  She does have bipedal edema which could be in part related to hyperproteinemia.  At this time we will try gentle IV hydration with normal saline at 50 mL an hour.  Repeat lab work in a.m..  Nephrology is being consulted.  We will also like to avoid nephrotoxic drugs.  We will only keep her on sliding scale insulin  coverage  at this time in view of recent hypoglycemia.  Based on clinical progress we will decide about further intervention.  Condition and plan was discussed with patient and family at bedside and all questions answered.    DVT prophylaxis:  Heparin   Diet: DIET DIABETIC  Calorie amount: CC 1800; Do you want to initiate MNT Protocol? Yes    Disposition: Estimated length of stay is less than 48 hr to obtain full medical treatment.      Reese Krebs, MD    [place] MEDICINE HOSPITALIST       [1] No Known Allergies  [2]   Social History  Tobacco Use    Smoking status: Former     Types: Cigarettes    Smokeless tobacco: Never   Vaping Use    Vaping status: Never Used   Substance Use Topics    Alcohol use: Not Currently    Drug use: Never

## 2024-01-22 NOTE — Discharge Instructions (Addendum)
 After patient Work with PT  F/u out patient with nephrology  Start taking lasix  40 mg daily  Take losartan 50 mg once a day  Hold hydrochlorothiazide and f/u with nephrology and PCP and resume if instructed.   Check BP daily and f/u with PCP for further titration of medication  Stop taking olmesartan   Resume rest of your medications as before  Return to the ER if symptoms worsen

## 2024-01-22 NOTE — ED Provider Notes (Signed)
 [facility], [place] - Emergency Department  ED Primary Note  History of Present Illness   Connie Barber is a 89 y.o. female who had concerns including Headache.     This is an 89 year old female who came into the emergency room with a complaint of a headache, fatigue, dizziness, nausea and bilateral lower extremity swelling.    Patient has a past medical history of hypertension diabetes, chronic kidney disease, coronary artery disease, hepatitis-C, high cholesterol,     Recently seen in [place] discharge on the 18th for chronic kidney disease hypertension, diabetes, congestive heart failure acute on chronic renal failure.    Patient appears to be in no respiratory or cardiovascular distress at the bedside    ROS negative other than what is stated in the HPI    Past Medical History:   Past Medical History:   Diagnosis Date    CKD (chronic kidney disease)     Stage 3b    Coronary artery disease     Diabetes mellitus     Dyslipidemia     Essential hypertension     Hepatitis C     Wears dentures     Wears glasses        Past Surgical History:   Past Surgical History:   Procedure Laterality Date    Cardiac catheterization  11/20/2014    Hx appendectomy      Hx hysterectomy         Social History: Social History[1]     Family History:  Family History   Problem Relation Age of Onset    Hypertension (High Blood Pressure) Mother     Heart Disease Mother     Stroke Father     No Known Problems Sister     No Known Problems Brother     No Known Problems Maternal Grandmother     No Known Problems Maternal Grandfather     No Known Problems Paternal Grandmother     No Known Problems Paternal Grandfather     No Known Problems Daughter     No Known Problems Son     No Known Problems Maternal Aunt     No Known Problems Maternal Uncle     No Known Problems Paternal Aunt     No Known Problems Paternal Uncle     No Known Problems Other      Oncology History    No history exists.        Physical Exam   ED  Triage Vitals [01/22/24 1059]   BP (Non-Invasive) (!) 123/44   Heart Rate 51   Respiratory Rate 20   Temperature 36.2 C (97.2 F)   SpO2 95 %   Weight 79.4 kg (175 lb)   Height 1.6 m (5' 3)     Physical Exam  Vitals and nursing note reviewed.   Constitutional:       Appearance: Normal appearance. She is normal weight.   HENT:      Nose: Nose normal.      Mouth/Throat:      Mouth: Mucous membranes are moist.      Pharynx: Oropharynx is clear.   Eyes:      Conjunctiva/sclera: Conjunctivae normal.      Pupils: Pupils are equal, round, and reactive to light.   Cardiovascular:      Rate and Rhythm: Regular rhythm. Bradycardia present.   Pulmonary:      Effort: Pulmonary effort is normal.   Abdominal:  General: Abdomen is flat. Bowel sounds are normal.      Palpations: Abdomen is soft.   Musculoskeletal:         General: Normal range of motion.      Right lower leg: Edema present.      Left lower leg: Edema present.   Skin:     General: Skin is warm.      Capillary Refill: Capillary refill takes less than 2 seconds.   Neurological:      General: No focal deficit present.      Mental Status: She is alert and oriented to person, place, and time. Mental status is at baseline.   Psychiatric:         Mood and Affect: Mood normal.         Behavior: Behavior normal.         Thought Content: Thought content normal.         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    Colchicine -Probenecid  500-0.5 mg Oral Tablet    Take 1 Tablet by mouth Every evening    cyanocobalamin  (VITAMIN B 12) 1,000 mcg Oral Tablet    Take 1 Tablet (1,000 mcg  total) by mouth Daily    ergocalciferol , vitamin D2, (DRISDOL) 1,250 mcg (50,000 unit) Oral Capsule    Take 1 Capsule (50,000 Units total) by mouth Every 7 days    esomeprazole magnesium  (NEXIUM) 40 mg Oral Capsule, Delayed Release(E.C.)    Take 1 Capsule (40 mg total) by mouth Once per day as needed for Other    ezetimibe  (ZETIA ) 10 mg Oral Tablet    Take 1 Tablet (10 mg total) by mouth Every morning for 180 days    hydroCHLOROthiazide (HYDRODIURIL) 12.5 mg Oral Tablet    Take 1 Tablet (12.5 mg total) by mouth Once a day    levothyroxine  (SYNTHROID ) 75 mcg Oral Tablet    Take 1 Tablet (75 mcg total) by mouth Every morning    magnesium  oxide (MAG-OX) 400 mg Oral Tablet    Take 1 Tablet (400 mg total) by mouth Twice daily for 7 days    olmesartan  (BENICAR ) 40 mg Oral Tablet    Take 1 Tablet (40 mg total) by mouth Daily    SITagliptin phosphate (JANUVIA) 50 mg Oral Tablet    Take 1 Tablet (50 mg total) by mouth Daily            Allergies: Allergies[2]    Above history reviewed with patient.  Allergies, medication list, reviewed.     Patient Data   Labs Ordered/Reviewed   COMPREHENSIVE METABOLIC PANEL, NON-FASTING - Abnormal; Notable for the following components:       Result Value    BUN 84 (*)     CREATININE 4.44 (*)     ESTIMATED GFR 9 (*)     GLUCOSE 115 (*)     OSMOLALITY, CALCULATED 310 (*)     All other components within normal limits    Narrative:  Estimated Glomerular Filtration Rate (eGFR) is calculated using the CKD-EPI (2021) equation, intended for patients 21 years of age and older. If gender is not documented or unknown, there will be no eGFR calculation.   PT/INR - Abnormal; Notable for the following components:    INR 1.12 (*)     All other components within normal limits    Narrative:     In the setting of warfarin therapy, a moderate-intensity INR goal range is 2.0 to 3.0 and a high-intensity INR goal range is 2.5 to 3.5.    INR is ONLY validated to determine the level of anticoagulation with  vitamin K antagonists (warfarin). Other factors may elevate the INR including but not limited to direct oral anticoagulants (DOACs), liver dysfunction, vitamin K deficiency, DIC, factor deficiencies, and factor inhibitors.   CBC WITH DIFF - Abnormal; Notable for the following components:    MONOCYTE % 12 (*)     All other components within normal limits   NT-PROBNP - Abnormal; Notable for the following components:    NT-PROBNP 467 (*)     All other components within normal limits   MAGNESIUM  - Abnormal; Notable for the following components:    MAGNESIUM  2.9 (*)     All other components within normal limits   PTT (PARTIAL THROMBOPLASTIN TIME) - Normal   TROPONIN-I - Normal    Narrative:     Values received on females ranging between 12-15 ng/L MUST include the next serial troponin to review changes in the delta differences as the reference range for the Access II chemistry analyzer is lower than the established reference range.     TROPONIN-I - Normal    Narrative:     Values received on females ranging between 12-15 ng/L MUST include the next serial troponin to review changes in the delta differences as the reference range for the Access II chemistry analyzer is lower than the established reference range.     LIPASE - Normal   CBC/DIFF    Narrative:     The following orders were created for panel order CBC/DIFF.  Procedure                               Abnormality         Status                     ---------                               -----------         ------                     CBC WITH IPQQ[[id number]]                Abnormal            Final result                 Please view results for these tests on the individual orders.   TROPONIN-I   URINALYSIS WITH REFLEX MICROSCOPIC AND CULTURE IF POSITIVE    Narrative:     The following orders were created for panel order URINALYSIS WITH REFLEX MICROSCOPIC AND CULTURE IF POSITIVE.  Procedure  Abnormality         Status                      ---------                               -----------         ------                     URINALYSIS, MACRO/MICRO[[id number]]                          In process                   Please view results for these tests on the individual orders.   URINALYSIS, MACRO/MICRO     CT BRAIN WO IV CONTRAST   Final Result by Edi, Radresults In (06/27 1153)      NO ACUTE FINDINGS         One or more dose reduction techniques were used (e.g., Automated exposure control, adjustment of the mA and/or kV according to patient size, use of iterative reconstruction technique).         Radiologist location ID: [id number]         XR AP MOBILE CHEST   Final Result by Edi, Radresults In (06/27 1142)   No new focal alveolar infiltrate is identified.         Radiologist location ID: [id number]           Medical Decision Making        Medical Decision Making  Nursing notes reviewed.    Medical chart and diagnostic results reviewed.    Attending available for questioning consult.    Emergency department course:     This is an 89 year old female who came into the emergency room with a complaint of a headache, fatigue, dizziness, nausea and bilateral lower extremity swelling.    Patient has a past medical history of hypertension diabetes, chronic kidney disease, coronary artery disease, hepatitis-C, high cholesterol,     Recently seen in [place] discharge on the 18th for chronic kidney disease hypertension, diabetes, congestive heart failure acute on chronic renal failure.  CBC unremarkable magnesium  2.9, BUN 84, creatinine 4.4, estimated GFR at 9, glucose 115, BNP 467    Did speak with Dr.El-Khatib from nephrology who suggested small bolus of normal saline then hourly hydration of normal saline with admission to the hospital where he will further evaluate the patient    Patient was kindly accepted to the hospital for further management by the hospitalist team          Amount and/or Complexity of Data Reviewed  Labs: ordered.  Radiology:  ordered. Decision-making details documented in ED Course.  ECG/medicine tests: ordered and independent interpretation performed.    Risk  OTC drugs.  Prescription drug management.  Decision regarding hospitalization.      ED Course as of 01/22/24 1449   Fri Jan 22, 2024   1424 CBC/DIFF(!)  Unremarkable   1424 MAGNESIUM (!)   1424 MAGNESIUM (!): 2.9   1424 COMPREHENSIVE METABOLIC PANEL, NON-FASTING(!)   1425 BUN(!): 84   1425 CREATININE(!): 4.44   1425 ESTIMATED GLOMERULAR FILTRATION RATE(!): 9   1425 GLUCOSE(!): 115   1425 OSMOLALITY, CALCULATED(!): 310   1425 TROPONIN-I: 13   1425 URINALYSIS WITH REFLEX MICROSCOPIC AND CULTURE IF POSITIVE  1425 NT-PROBNP(!)   1425 NT-PROBNP(!): 467   1425 CT BRAIN WO IV CONTRAST  No acute pathology   1425 XR AP MOBILE CHEST  No new infiltrates            Medications Ordered/Administered in the ED   NS bolus infusion 500 mL (has no administration in time range)   NS premix infusion (has no administration in time range)   acetaminophen  (TYLENOL ) tablet (975 mg Oral Given 01/22/24 1218)     Clinical Impression   Chronic kidney disease, stage 3 (Primary)   AKI (acute kidney injury) (CMS HCC)       Disposition: Admitted                  [1]   Social History  Tobacco Use    Smoking status: Former     Types: Cigarettes    Smokeless tobacco: Never   Vaping Use    Vaping status: Never Used   Substance Use Topics    Alcohol use: Not Currently    Drug use: Never   [2] No Known Allergies

## 2024-01-22 NOTE — Consults (Signed)
 [facility]   Nephrology Consult      Barber, Connie  M, 89 y.o. female  Encounter Start Date:  01/22/2024  Inpatient Admission Date:  01/22/2024  Date of Birth:  02-23-1935    Admitting Provider:  Hospitalist service    Reason for Consult:  Acute kidney injury on top of chronic kidney disease    HPI:  This is an 89 year old black female patient known to have chronic kidney disease stage 4 with a baseline creatinine 1.7 GFR is 20 8 came in with headache dizziness nausea lower extremity swelling patient had been diabetic hypertensive chronic kidney disease so she has acute kidney injury on top of chronic kidney disease will start hydrating creatinine now 4.4 start hydrating her normal saline 100 mL/hour check kidney ultrasound no indication for dialysis hold nephrotoxic medications colchicine  hold hydrochlorothiazide    Past Medical History:   Diagnosis Date    CKD (chronic kidney disease)     Stage 3b    Coronary artery disease     Diabetes mellitus     Dyslipidemia     Essential hypertension     Hepatitis C     Wears dentures     Wears glasses          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    Colchicine -Probenecid  500-0.5 mg Oral Tablet    Take 1 Tablet by mouth Every evening    cyanocobalamin  (VITAMIN B 12) 1,000 mcg Oral Tablet    Take 1 Tablet (1,000 mcg total) by mouth Daily    ergocalciferol , vitamin D2, (DRISDOL) 1,250 mcg (50,000 unit) Oral Capsule    Take 1 Capsule (50,000 Units total) by mouth Every 7 days    esomeprazole magnesium  (NEXIUM) 40 mg Oral Capsule, Delayed Release(E.C.)    Take 1  Capsule (40 mg total) by mouth Once per day as needed for Other    ezetimibe  (ZETIA ) 10 mg Oral Tablet    Take 1 Tablet (10 mg total) by mouth Every morning for 180 days    hydroCHLOROthiazide (HYDRODIURIL) 12.5 mg Oral Tablet    Take 1 Tablet (12.5 mg total) by mouth Once a day    levothyroxine  (SYNTHROID ) 75 mcg Oral Tablet    Take 1 Tablet (75 mcg total) by mouth Every morning    magnesium  oxide (MAG-OX) 400 mg Oral Tablet    Take 1 Tablet (400 mg total) by mouth Twice daily for 7 days    olmesartan  (BENICAR ) 40 mg Oral Tablet    Take 1 Tablet (40 mg total) by mouth Daily    SITagliptin phosphate (JANUVIA) 50 mg Oral Tablet    Take 1 Tablet (50 mg total) by mouth Daily          acetaminophen  (TYLENOL ) tablet, 650 mg, Oral, Q4H PRN  aluminum-magnesium  hydroxide-simethicone (MAG-AL PLUS) 200-200-20 mg per 5 mL oral liquid, 30 mL, Oral, Q4H PRN  aspirin  chewable tablet 81 mg, 81 mg, Oral, Daily  calcitriol  (ROCALTROL ) capsule, 0.25 mcg, Oral, Daily  Correction/SSIP insulin  lispro 100 units/mL injection, 1-5 Units, Subcutaneous, 4x/day AC  cyanocobalamin  (VITAMIN B12) tablet, 1,000 mcg, Oral, Daily  docusate sodium (COLACE) capsule, 100 mg, Oral, 2x/day PRN  heparin  5,000 unit/mL injection, 5,000 Units, Subcutaneous, Q8HRS  [START ON 01/23/2024] levothyroxine  (SYNTHROID ) tablet 75 mcg, 75 mcg, Oral, QAM  losartan (COZAAR) tablet, 50 mg, Oral, Daily  NS premix infusion, , Intravenous, Continuous  ondansetron  (ZOFRAN ) 2 mg/mL injection, 4 mg, Intravenous, Q6H PRN  pantoprazole  (PROTONIX ) delayed release tablet, 40 mg, Oral, Daily      Allergies[1]    ROS:   Constitutional: negative for fevers, chills, sweats, and fatigue  Eyes: negative for visual disturbance, irritation, redness, and icterus  Ears, nose, mouth, throat, and face: negative for hearing loss, tinnitus, ear drainage, nasal congestion, epistaxis, and sore throat  Respiratory: negative for cough, sputum, hemoptysis, wheezing, or dyspnea on  exertion  Cardiovascular: negative for chest pain, palpitations, , orthopnea, paroxysmal nocturnal dyspnea, and lower extremity edema  Gastrointestinal: negative for  nausea, vomiting, melena, diarrhea, constipation, and abdominal pain  Genitourinary:negative for frequency, dysuria, nocturia, urinary incontinence, hesitancy, and hematuria  Integument/breast: negative for rash, skin lesion(s), and pruritus  Hematologic/lymphatic: negative for easy bruising, bleeding, and petechiae  Musculoskeletal:negative for myalgias, arthralgias, neck pain, back pain, and muscle weakness  Neurological: negative for headaches, dizziness, seizures, speech problems, tremor, and weakness  Behavioral/Psych: negative for anxiety, behavior problems, mood swings, and sleep disturbance  Endocrine: negative for temperature intolerance  Allergic/Immunologic: negative for urticaria and angioedema      EXAM:  Filed Vitals:    01/22/24 1530 01/22/24 1545 01/22/24 1600 01/22/24 1719   BP: (!) 135/56  (!) 135/58    Pulse: 54 54 58 55   Resp: 20 14 20     Temp:       SpO2: 96% 93% 92%        Constitutional: Alert and Oriented time person and place . Not in acute distress  HEENT : Vision and hearing grossly normal   Eyes: Conjunctiva clear., Pupils equal and round, reactive to light and accomodation. , Sclera non-icteric.   ENT: Nose without erythema. , Mouth mucous membranes moist.   Neck: JVD negative no thyromegaly or lymphadenopathy and supple, symmetrical, trachea midline  Respiratory: Clear to auscultation bilaterally. No wheezes, No rales, Good air exchange bilaterally  Cardiovascular: regular rate and rhythm no murmurs no rub  Gastrointestinal: Soft, non-tender, Bowel sounds normal, No hepatosplenomegaly  Genitourinary: no suprapubic tenderness  Lower Extremities : No edema . Pulses + 4 bilateral   Neurologic: Grossly normal. Speech clear.   Psychiatric: Normal mood and affect,   Labs:         CBC Results Differential Results   Recent Labs      01/22/24  1121   WBC 5.2   HGB 12.3   HCT 37.0    Recent Labs     01/22/24  1121   PMNS 46   LYMPHOCYTES 39   MONOCYTES 12*   EOSINOPHIL 2   BASOPHILS 0  0.02   PMNABS 2.38   LYMPHSABS 2.02   MONOSABS 0.61   EOSABS 0.11      BMP Results Other Chemistries Results   Recent Labs     01/22/24  1121   SODIUM 142   POTASSIUM 4.8   CHLORIDE 104   CO2 30   BUN 84*   CREATININE 4.44*   GFR 9*   ANIONGAP 8    Recent Labs     01/22/24  1121 01/22/24  1142   CALCIUM 9.2  --    ALBUMIN 3.5  --    MAGNESIUM   --  2.9*      Liver/Pancreas Enzyme Results Blood Gas     Recent Labs     01/22/24  1121 01/22/24  1142   TOTALPROTEIN 7.6  --    ALBUMIN 3.5  --    AST 20  --    ALT 23  --    ALKPHOS 82  --    LIPASE  --  24    No results found for this encounter   Cardiac Results    Coags Results     TROPONIN I  Lab Results   Component Value Date    TROPONINI 15 (H) 01/22/2024    TROPONINI 13 01/22/2024    TROPONINI 13 01/22/2024         Recent Labs     01/22/24  1121   INR 1.12*          Imaging Studies:    CT BRAIN WO IV CONTRAST   Final Result      NO ACUTE FINDINGS         One or more dose reduction techniques were used (e.g., Automated exposure control, adjustment of the mA and/or kV according to patient size, use of iterative reconstruction technique).         Radiologist location ID: [id number]         XR AP MOBILE CHEST   Final Result   No new focal alveolar infiltrate is identified.         Radiologist location ID: [id number]              Assessment/Plan:  Active Hospital Problems    Diagnosis    Primary Problem: AKI (acute kidney injury) (CMS HCC)    Stage 3b chronic kidney disease (CMS HCC)      Acute kidney injury on top of chronic kidney disease continue hydration increase fluids to normal saline 100 mL/hour check kidney ultrasound no indication for dialysis avoid nephrotoxic medications  Chronic kidney disease stage 4 will continue to monitor kidney function no indication for dialysis check kidney  ultrasound        Lillette Dach, MD       [1] No Known Allergies

## 2024-01-22 NOTE — ED Nurses Note (Signed)
 Pt awake alert and oriented. Speech clear. Moves all extremities at will and to command. Generalized weakness noted. No facial droop Pupils 3.0 mm equal and react brisk. Skin warm and dry. Cardiac monitor shows SR without ectopy. Respirations even and unlabored. 02 sat 100% room air. Pt c/o's neck pain radiating up into head. 8/10.

## 2024-01-23 ENCOUNTER — Observation Stay (HOSPITAL_COMMUNITY)

## 2024-01-23 DIAGNOSIS — N179 Acute kidney failure, unspecified: Secondary | ICD-10-CM

## 2024-01-23 DIAGNOSIS — Z794 Long term (current) use of insulin: Secondary | ICD-10-CM

## 2024-01-23 LAB — CBC WITH DIFF
BASOPHIL #: 0 10*3/uL (ref 0.00–0.10)
BASOPHIL %: 1 % (ref 0–1)
EOSINOPHIL #: 0.1 10*3/uL (ref 0.00–0.50)
EOSINOPHIL %: 3 % (ref 1–7)
HCT: 35.3 % (ref 31.2–41.9)
HGB: 11.7 g/dL (ref 10.9–14.3)
LYMPHOCYTE #: 1.3 10*3/uL (ref 1.10–3.10)
LYMPHOCYTE %: 35 % (ref 16–46)
MCH: 28.9 pg (ref 24.7–32.8)
MCHC: 33.2 g/dL (ref 32.3–35.6)
MCV: 87 fL (ref 75.5–95.3)
MONOCYTE #: 0.5 10*3/uL (ref 0.20–0.90)
MONOCYTE %: 14 % — ABNORMAL HIGH (ref 4–11)
MPV: 10.8 fL (ref 7.9–10.8)
NEUTROPHIL #: 1.6 10*3/uL — ABNORMAL LOW (ref 1.90–8.20)
NEUTROPHIL %: 46 % (ref 43–77)
PLATELETS: 145 10*3/uL (ref 140–440)
RBC: 4.06 10*6/uL (ref 3.63–4.92)
RDW: 16.1 % (ref 12.3–17.7)
WBC: 3.6 10*3/uL — ABNORMAL LOW (ref 3.8–11.8)

## 2024-01-23 LAB — BASIC METABOLIC PANEL
ANION GAP: 6 mmol/L (ref 4–13)
BUN/CREA RATIO: 21 (ref 6–22)
BUN: 63 mg/dL — ABNORMAL HIGH (ref 7–25)
CALCIUM: 9 mg/dL (ref 8.6–10.3)
CHLORIDE: 110 mmol/L — ABNORMAL HIGH (ref 98–107)
CO2 TOTAL: 28 mmol/L (ref 21–31)
CREATININE: 3.02 mg/dL — ABNORMAL HIGH (ref 0.60–1.30)
ESTIMATED GFR: 15 mL/min/{1.73_m2} — ABNORMAL LOW (ref >59)
GLUCOSE: 79 mg/dL (ref 74–109)
OSMOLALITY, CALCULATED: 304 mosm/kg — ABNORMAL HIGH (ref 270–290)
POTASSIUM: 5 mmol/L (ref 3.5–5.1)
SODIUM: 144 mmol/L (ref 136–145)

## 2024-01-23 LAB — POC BLOOD GLUCOSE (RESULTS)
GLUCOSE, POC: 86 mg/dL (ref 70–100)
GLUCOSE, POC: 144 mg/dL — ABNORMAL HIGH (ref 70–100)
GLUCOSE, POC: 101 mg/dL — ABNORMAL HIGH (ref 70–100)
GLUCOSE, POC: 98 mg/dL (ref 70–100)

## 2024-01-23 LAB — URINE CULTURE,ROUTINE: URINE CULTURE: 2000 — AB

## 2024-01-23 MED ORDER — SODIUM CHLORIDE 0.9 % INTRAVENOUS SOLUTION
INTRAVENOUS | Status: DC
Start: 2024-01-23 — End: 2024-01-24
  Administered 2024-01-24: 0 mL via INTRAVENOUS

## 2024-01-23 NOTE — Care Plan (Signed)
 Pt admitted with lower ext edema and elevated creatinine level. No s/s of distress noted at this time. SpO2 in mid to high 90's on room air, IV infusing per order, and no c/o pain at this time. Call light in reach.   Problem: Adult Inpatient Plan of Care  Goal: Plan of Care Review  Outcome: Ongoing (see interventions/notes)  Goal: Patient-Specific Goal (Individualized)  Outcome: Ongoing (see interventions/notes)  Goal: Absence of Hospital-Acquired Illness or Injury  Outcome: Ongoing (see interventions/notes)  Intervention: Identify and Manage Fall Risk  Recent Flowsheet Documentation  Taken 01/22/2024 1930 by Steffanie  Safety Promotion/Fall Prevention:   fall prevention program maintained   nonskid shoes/slippers when out of bed   safety round/check completed  Intervention: Prevent Skin Injury  Recent Flowsheet Documentation  Taken 01/22/2024 1930 by Steffanie  Body Position:   supine   supine, head elevated  Skin Protection:   adhesive use limited   incontinence pads utilized  Intervention: Prevent and Manage VTE (Venous Thromboembolism) Risk  Recent Flowsheet Documentation  Taken 01/22/2024 1930 by Steffanie  VTE Prevention/Management:   ambulation promoted   anticoagulant therapy maintained  Intervention: Prevent Infection  Recent Flowsheet Documentation  Taken 01/22/2024 1930 by Steffanie  Infection Prevention:   glycemic control managed   rest/sleep promoted  Goal: Optimal Comfort and Wellbeing  Outcome: Ongoing (see interventions/notes)  Intervention: Provide Person-Centered Care  Recent Flowsheet Documentation  Taken 01/22/2024 1930 by Steffanie  Trust Relationship/Rapport:   care explained   choices provided   questions answered   questions encouraged   reassurance provided  Goal: Rounds/Family Conference  Outcome: Ongoing (see interventions/notes)     Problem: Fall Injury Risk  Goal: Absence of Fall and Fall-Related Injury  Outcome: Ongoing (see interventions/notes)  Intervention: Identify and Manage  Contributors  Recent Flowsheet Documentation  Taken 01/22/2024 1930 by Steffanie  Self-Care Promotion: independence encouraged  Medication Review/Management: medications reviewed  Intervention: Promote Injury-Free Environment  Recent Flowsheet Documentation  Taken 01/22/2024 1930 by Steffanie  Safety Promotion/Fall Prevention:   fall prevention program maintained   nonskid shoes/slippers when out of bed   safety round/check completed     Problem: Discharge Needs Assessment  Goal: Discharge Needs Assessment  Outcome: Ongoing (see interventions/notes)     Problem: Health Knowledge, Opportunity to Enhance (Adult,Obstetrics,Pediatric)  Goal: Knowledgeable about Health Subject/Topic  Description: Patient will demonstrate the desired outcomes by discharge/transition of care.  Outcome: Ongoing (see interventions/notes)  Intervention: Enhance Health Knowledge  Recent Flowsheet Documentation  Taken 01/22/2024 1930 by Steffanie  Family/Support System Care:   self-care encouraged   presence promoted     Problem: Skin Injury Risk Increased  Goal: Skin Health and Integrity  Outcome: Ongoing (see interventions/notes)  Intervention: Optimize Skin Protection  Recent Flowsheet Documentation  Taken 01/22/2024 1930 by Steffanie  Skin Protection:   adhesive use limited   incontinence pads utilized  Activity Management:   ambulated to bathroom   ambulated in room   ROM, active encouraged  Head of Bed (HOB) Positioning: HOB elevated

## 2024-01-23 NOTE — Progress Notes (Signed)
 [facility]   Progress Note    Connie  JEANNETT Barber  Date of service: 01/23/2024  Date of Admission:  01/22/2024  Hospital Day:  LOS: 1 day       This is a 89 year old female who presented to [facility] emergency department with complaint of headache dizziness nausea and bilateral lower extremity swelling. Patient was recently admitted to [facility] with suspected hypoglycemia and diastolic CHF exacerbation. She does has history of hypertension diabetes and chronic kidney disease. Patient was evaluated in [facility] emergency department and was found to be in acute renal failure on top of chronic kidney disease. She was subsequently transferred to main campus for possible admission.     Subjective:  Patient seen and evaluated with the bedside counts no new complaints.  No overnight events.    Review of Systems:    10+ systems were reviewed and were negative except for above    Objective:      Vital Signs:  Vitals:    01/23/24 1113 01/23/24 1139 01/23/24 1524 01/23/24 1600   BP:  (!) 118/57 (!) 140/59    Pulse: (!) 47 51 (!) 48 (!) 44   Resp:  18 18    Temp:  36.3 C (97.4 F) 36.4 C (97.5 F)    SpO2:  97% 95%    Weight:       Height:       BMI:                I/O:  I/O last 24 hours:    Intake/Output Summary (Last 24 hours) at 01/23/2024 1642  Last data filed at 01/23/2024 1354  Gross per 24 hour   Intake 2100.42 ml   Output 800 ml   Net 1300.42 ml         acetaminophen  (TYLENOL ) tablet, 650 mg, Oral, Q4H PRN  aluminum-magnesium  hydroxide-simethicone (MAG-AL PLUS) 200-200-20 mg per 5 mL oral liquid, 30 mL, Oral, Q4H PRN  aspirin  chewable tablet 81 mg, 81 mg, Oral, Daily  calcitriol  (ROCALTROL ) capsule, 0.25 mcg, Oral, Daily  Correction/SSIP insulin  lispro 100 units/mL injection, 1-5 Units, Subcutaneous, 4x/day AC  cyanocobalamin  (VITAMIN B12) tablet, 1,000 mcg, Oral, Daily  docusate sodium (COLACE) capsule, 100 mg, Oral, 2x/day PRN  heparin  5,000 unit/mL injection, 5,000 Units,  Subcutaneous, Q8HRS  levothyroxine  (SYNTHROID ) tablet 75 mcg, 75 mcg, Oral, QAM  [Held by provider] losartan (COZAAR) tablet, 50 mg, Oral, Daily  ondansetron  (ZOFRAN ) 2 mg/mL injection, 4 mg, Intravenous, Q6H PRN  pantoprazole  (PROTONIX ) delayed release tablet, 40 mg, Oral, Daily        Physical Exam:    Physical Exam  Vitals and nursing note reviewed.   Constitutional:       General: She is not in acute distress.     Appearance: She is well-developed.   HENT:      Head: Normocephalic and atraumatic.   Eyes:      Conjunctiva/sclera: Conjunctivae normal.   Cardiovascular:      Rate and Rhythm: Normal rate and regular rhythm.      Heart sounds: No murmur heard.  Pulmonary:      Effort: Pulmonary effort is normal. No respiratory distress.      Breath sounds: Normal breath sounds.   Abdominal:      Palpations: Abdomen is soft.      Tenderness: There is no abdominal tenderness.   Musculoskeletal:         General: No swelling.      Cervical back: Neck  supple.   Skin:     General: Skin is warm and dry.      Capillary Refill: Capillary refill takes less than 2 seconds.   Neurological:      Mental Status: She is alert.   Psychiatric:         Mood and Affect: Mood normal.         Labs:  Results for orders placed or performed during the hospital encounter of 01/22/24 (from the past 24 hours)   POC BLOOD GLUCOSE (RESULTS)    Collection Time: 01/22/24  5:46 PM   Result Value Ref Range    GLUCOSE, POC 96 70 - 100 mg/dl   POC BLOOD GLUCOSE (RESULTS)    Collection Time: 01/22/24  8:41 PM   Result Value Ref Range    GLUCOSE, POC 108 (H) 70 - 100 mg/dl   POC BLOOD GLUCOSE (RESULTS)    Collection Time: 01/23/24  5:35 AM   Result Value Ref Range    GLUCOSE, POC 86 70 - 100 mg/dl   CBC/DIFF    Collection Time: 01/23/24  6:17 AM    Narrative    The following orders were created for panel order CBC/DIFF.  Procedure                               Abnormality         Status                     ---------                                -----------         ------                     CBC WITH IPQQ[[id number]]                Abnormal            Final result                 Please view results for these tests on the individual orders.   BASIC METABOLIC PANEL, NON-FASTING    Collection Time: 01/23/24  6:17 AM   Result Value Ref Range    SODIUM 144 136 - 145 mmol/L    POTASSIUM 5.0 3.5 - 5.1 mmol/L    CHLORIDE 110 (H) 98 - 107 mmol/L    CO2 TOTAL 28 21 - 31 mmol/L    ANION GAP 6 4 - 13 mmol/L    CALCIUM 9.0 8.6 - 10.3 mg/dL    GLUCOSE 79 74 - 890 mg/dL    BUN 63 (H) 7 - 25 mg/dL    CREATININE 6.97 (H) 0.60 - 1.30 mg/dL    BUN/CREA RATIO 21 6 - 22    ESTIMATED GFR 15 (L) >59 mL/min/1.52m^2    OSMOLALITY, CALCULATED 304 (H) 270 - 290 mOsm/kg    Narrative    Estimated Glomerular Filtration Rate (eGFR) is calculated using the CKD-EPI (2021) equation, intended for patients 64 years of age and older. If gender is not documented or unknown, there will be no eGFR calculation.     CBC WITH DIFF    Collection Time: 01/23/24  6:17 AM   Result Value Ref Range    WBC 3.6 (L) 3.8 - 11.8 x10^3/uL  RBC 4.06 3.63 - 4.92 x10^6/uL    HGB 11.7 10.9 - 14.3 g/dL    HCT 64.6 68.7 - 58.0 %    MCV 87.0 75.5 - 95.3 fL    MCH 28.9 24.7 - 32.8 pg    MCHC 33.2 32.3 - 35.6 g/dL    RDW 83.8 87.6 - 82.2 %    PLATELETS 145 140 - 440 x10^3/uL    MPV 10.8 7.9 - 10.8 fL    NEUTROPHIL % 46 43 - 77 %    LYMPHOCYTE % 35 16 - 46 %    MONOCYTE % 14 (H) 4 - 11 %    EOSINOPHIL % 3 1 - 7 %    BASOPHIL % 1 0 - 1 %    NEUTROPHIL # 1.60 (L) 1.90 - 8.20 x10^3/uL    LYMPHOCYTE # 1.30 1.10 - 3.10 x10^3/uL    MONOCYTE # 0.50 0.20 - 0.90 x10^3/uL    EOSINOPHIL # 0.10 0.00 - 0.50 x10^3/uL    BASOPHIL # 0.00 0.00 - 0.10 x10^3/uL   POC BLOOD GLUCOSE (RESULTS)    Collection Time: 01/23/24 11:05 AM   Result Value Ref Range    GLUCOSE, POC 144 (H) 70 - 100 mg/dl   POC BLOOD GLUCOSE (RESULTS)    Collection Time: 01/23/24  3:22 PM   Result Value Ref Range    GLUCOSE, POC 101 (H) 70 - 100 mg/dl             Imaging:    Results for orders placed or performed during the hospital encounter of 01/22/24 (from the past 24 hours)   US  KIDNEY WITH BLADDER     Status: None    Narrative    Connie Barber    RADIOLOGIST: Norleen Dempsey Gilles, MD    US  KIDNEY WITH BLADDER performed on 01/23/2024 10:17 AM    CLINICAL HISTORY: [email or web address]    TECHNIQUE: Ultrasound of the kidneys and bladder    COMPARISON:  None.    FINDINGS:  Kidneys:       The right kidney measures 7.5 cm       The left kidney measures 7.1 cm    The kidneys are of normal echotexture. There is symmetric bilateral decreased renal size. Renal parenchymal thicknesses are grossly preserved.   No hydronephrosis.    No cysts or large solid renal masses.    Bladder:        Prevoid volume: 170 ml.       Postvoid volume: 8.5 ml.  Normally distended with a grossly normal contour.  No large bladder wall mass is identified.        Impression    Symmetric bilateral decreased renal size.    No acute findings or hydronephrosis.    Unremarkable sonographic evaluation of the urinary bladder.              Radiologist location ID: [id number]           Microbiology:  Hospital Encounter on 01/22/24 (from the past 96 hours)   URINE CULTURE,ROUTINE    Collection Time: 01/22/24  2:40 PM    Specimen: Urine, Site not specified   Culture Result Status    URINE CULTURE 2,000 CFU/mL Mixed Flora (A) Preliminary    Narrative    These results may or may not reflect clinically significant isolates because there is contamination with the usual skin flora that could interfere  with the growth of potential pathogens.  Assessment/ Plan:   Active Hospital Problems    Diagnosis    Primary Problem: AKI (acute kidney injury) (CMS HCC)    Stage 3b chronic kidney disease (CMS HCC)    Acute renal failure on top of chronic kidney disease with possible ATN  Chronic kidney disease stage IIIB at baseline  Diabetes  Recent history of hypoglycemia  Probable protein calorie malnutrition  Chronic  diastolic CHF with preserved ejection fraction  Borderline elevated troponin likely secondary to chronic kidney disease  Moderate pulmonary hypertension  Obesity  Hypothyroidism  Hypertension    Patient was placed on observation yesterday, was seen by Nephrology team, currently on normal saline at 100 cc an hour  Creatinine is improving from 4.5 to 3  Cutting down on normal saline 75 cc an hour  UA growing mixed flora  Chest x-ray within normal limits, CT brain within normal limits  Ultrasound kidneys showed symmetric bilateral decreased renal size, no acute hydronephrosis  EF from 2 weeks back was 50-55%, moderately dilated left atrium  Remains on    Deep vein thrombosis prophylaxis: Subcu heparin     Nutrition:   DIET DIABETIC Calorie amount: CC 1800; Do you want to initiate MNT Protocol? Yes; Additional modifications/limitations: 2 G SODIUM  GUEST TRAY             Additional clinical characteristics related to nutrition:    - monitor for weight changes   - monitor intake and output    - monitor bowel functions        Lab Results   Component Value Date    ALBUMIN 3.5 01/22/2024           Disposition Planning:   Based on clinical course    Kiki Applebaum, MD    This note was partially generated using MModal Fluency Direct system, and there may be some incorrect words, spellings, and punctuation that were not noted in checking the note before saving.

## 2024-01-24 ENCOUNTER — Observation Stay (HOSPITAL_COMMUNITY)

## 2024-01-24 DIAGNOSIS — M7989 Other specified soft tissue disorders: Secondary | ICD-10-CM

## 2024-01-24 DIAGNOSIS — N170 Acute kidney failure with tubular necrosis: Secondary | ICD-10-CM | POA: Diagnosis present

## 2024-01-24 LAB — CBC WITH DIFF
BASOPHIL #: 0 x10ˆ3/uL (ref 0.00–0.10)
BASOPHIL %: 1 % (ref 0–1)
EOSINOPHIL #: 0.1 x10ˆ3/uL (ref 0.00–0.50)
EOSINOPHIL %: 2 % (ref 1–7)
HCT: 36.2 % (ref 31.2–41.9)
HGB: 11.9 g/dL (ref 10.9–14.3)
LYMPHOCYTE #: 1.1 x10ˆ3/uL (ref 1.10–3.10)
LYMPHOCYTE %: 29 % (ref 16–46)
MCH: 28.6 pg (ref 24.7–32.8)
MCHC: 32.8 g/dL (ref 32.3–35.6)
MCV: 87.4 fL (ref 75.5–95.3)
MONOCYTE #: 0.5 x10ˆ3/uL (ref 0.20–0.90)
MONOCYTE %: 13 % — ABNORMAL HIGH (ref 4–11)
MPV: 10.3 fL (ref 7.9–10.8)
NEUTROPHIL #: 2.2 x10ˆ3/uL (ref 1.90–8.20)
NEUTROPHIL %: 55 % (ref 43–77)
PLATELETS: 148 x10ˆ3/uL (ref 140–440)
RBC: 4.15 x10ˆ6/uL (ref 3.63–4.92)
RDW: 16 % (ref 12.3–17.7)
WBC: 3.9 x10ˆ3/uL (ref 3.8–11.8)

## 2024-01-24 LAB — COMPREHENSIVE METABOLIC PANEL, NON-FASTING
ALBUMIN/GLOBULIN RATIO: 1.1 (ref 0.8–1.4)
ALBUMIN: 3.3 g/dL — ABNORMAL LOW (ref 3.5–5.7)
ALKALINE PHOSPHATASE: 66 U/L (ref 34–104)
ALT (SGPT): 9 U/L (ref 7–52)
ANION GAP: 7 mmol/L (ref 4–13)
AST (SGOT): 13 U/L (ref 13–39)
BILIRUBIN TOTAL: 0.4 mg/dL (ref 0.3–1.0)
BUN/CREA RATIO: 19 (ref 6–22)
BUN: 38 mg/dL — ABNORMAL HIGH (ref 7–25)
CALCIUM, CORRECTED: 9.5 mg/dL (ref 8.9–10.8)
CALCIUM: 8.9 mg/dL (ref 8.6–10.3)
CHLORIDE: 111 mmol/L — ABNORMAL HIGH (ref 98–107)
CO2 TOTAL: 25 mmol/L (ref 21–31)
CREATININE: 1.99 mg/dL — ABNORMAL HIGH (ref 0.60–1.30)
ESTIMATED GFR: 24 mL/min/1.73mˆ2 — ABNORMAL LOW (ref >59)
GLOBULIN: 3 (ref 2.0–3.5)
GLUCOSE: 130 mg/dL — ABNORMAL HIGH (ref 74–109)
OSMOLALITY, CALCULATED: 296 mosm/kg — ABNORMAL HIGH (ref 270–290)
POTASSIUM: 5.1 mmol/L (ref 3.5–5.1)
PROTEIN TOTAL: 6.3 g/dL — ABNORMAL LOW (ref 6.4–8.9)
SODIUM: 143 mmol/L (ref 136–145)

## 2024-01-24 LAB — POC BLOOD GLUCOSE (RESULTS)
GLUCOSE, POC: 136 mg/dL — ABNORMAL HIGH (ref 70–100)
GLUCOSE, POC: 135 mg/dL — ABNORMAL HIGH (ref 70–100)
GLUCOSE, POC: 90 mg/dL (ref 70–100)
GLUCOSE, POC: 92 mg/dL (ref 70–100)

## 2024-01-24 LAB — BASIC METABOLIC PANEL
ANION GAP: 7 mmol/L (ref 4–13)
BUN/CREA RATIO: 19 (ref 6–22)
BUN: 38 mg/dL — ABNORMAL HIGH (ref 7–25)
CALCIUM: 8.9 mg/dL (ref 8.6–10.3)
CHLORIDE: 111 mmol/L — ABNORMAL HIGH (ref 98–107)
CO2 TOTAL: 25 mmol/L (ref 21–31)
CREATININE: 1.99 mg/dL — ABNORMAL HIGH (ref 0.60–1.30)
ESTIMATED GFR: 24 mL/min/1.73mˆ2 — ABNORMAL LOW (ref >59)
GLUCOSE: 130 mg/dL — ABNORMAL HIGH (ref 74–109)
OSMOLALITY, CALCULATED: 296 mosm/kg — ABNORMAL HIGH (ref 270–290)
POTASSIUM: 5.1 mmol/L (ref 3.5–5.1)
SODIUM: 143 mmol/L (ref 136–145)

## 2024-01-24 LAB — MAGNESIUM: MAGNESIUM: 2 mg/dL (ref 1.9–2.7)

## 2024-01-24 MED ORDER — METOPROLOL TARTRATE 25 MG TABLET
25.0000 mg | ORAL_TABLET | Freq: Two times a day (BID) | ORAL | Status: DC
Start: 2024-01-24 — End: 2024-01-25
  Administered 2024-01-24: 0 mg via ORAL

## 2024-01-24 MED ORDER — FUROSEMIDE 10 MG/ML INJECTION SOLUTION
20.0000 mg | Freq: Once | INTRAMUSCULAR | Status: AC
Start: 2024-01-24 — End: 2024-01-24
  Administered 2024-01-24: 20 mg via INTRAVENOUS
  Filled 2024-01-24: qty 4

## 2024-01-24 MED ORDER — ALBUMIN, HUMAN 25 % INTRAVENOUS SOLUTION
25.0000 g | Freq: Once | INTRAVENOUS | Status: AC
Start: 2024-01-24 — End: 2024-01-24
  Administered 2024-01-24: 0 g via INTRAVENOUS
  Administered 2024-01-24: 25 g via INTRAVENOUS
  Filled 2024-01-24: qty 100

## 2024-01-24 NOTE — Progress Notes (Addendum)
 [facility] [place]   Progress Note    Connie  CHRISTELLA Barber  Date of service: 01/24/2024  Date of Admission:  01/22/2024  Hospital Day:  LOS: 1 day       This is a 89 year old female who presented to [facility] emergency department with complaint of headache dizziness nausea and bilateral lower extremity swelling. Patient was recently admitted to [facility] with suspected hypoglycemia and diastolic CHF exacerbation. She does has history of hypertension diabetes and chronic kidney disease. Patient was evaluated in [facility] emergency department and was found to be in acute renal failure on top of chronic kidney disease. She was subsequently transferred to main campus for possible admission.     Subjective:  Patient seen and evaluated by the bedside, she is AO x3, no overnight events.  No nursing concerns at this time.  Bilateral lower extremity swelling, left more than right, bilateral lower extremity Dopplers ordered    Review of Systems:    10+ systems were reviewed and were negative except for above    Objective:      Vital Signs:  Vitals:    01/24/24 0755 01/24/24 1000 01/24/24 1005 01/24/24 1216   BP: (!) 133/54   (!) 159/68   Pulse: (!) 46 50 54 (!) 49   Resp: 16   16   Temp: 36.3 C (97.4 F)   36.4 C (97.5 F)   SpO2: 96%   98%   Weight:       Height:       BMI:                I/O:  I/O last 24 hours:    Intake/Output Summary (Last 24 hours) at 01/24/2024 1223  Last data filed at 01/24/2024 1023  Gross per 24 hour   Intake 2386.67 ml   Output --   Net 2386.67 ml         acetaminophen  (TYLENOL ) tablet, 650 mg, Oral, Q4H PRN  aluminum-magnesium  hydroxide-simethicone (MAG-AL PLUS) 200-200-20 mg per 5 mL oral liquid, 30 mL, Oral, Q4H PRN  aspirin  chewable tablet 81 mg, 81 mg, Oral, Daily  calcitriol  (ROCALTROL ) capsule, 0.25 mcg, Oral, Daily  Correction/SSIP insulin  lispro 100 units/mL injection, 1-5 Units, Subcutaneous, 4x/day AC  cyanocobalamin  (VITAMIN B12) tablet, 1,000 mcg, Oral,  Daily  docusate sodium (COLACE) capsule, 100 mg, Oral, 2x/day PRN  heparin  5,000 unit/mL injection, 5,000 Units, Subcutaneous, Q8HRS  levothyroxine  (SYNTHROID ) tablet 75 mcg, 75 mcg, Oral, QAM  [Held by provider] losartan (COZAAR) tablet, 50 mg, Oral, Daily  metoprolol tartrate (LOPRESSOR) tablet, 25 mg, Oral, 2x/day  NS premix infusion, , Intravenous, Continuous  ondansetron  (ZOFRAN ) 2 mg/mL injection, 4 mg, Intravenous, Q6H PRN  pantoprazole  (PROTONIX ) delayed release tablet, 40 mg, Oral, Daily        Physical Exam:    Physical Exam  Vitals and nursing note reviewed.   Constitutional:       General: She is not in acute distress.     Appearance: She is well-developed.   HENT:      Head: Normocephalic and atraumatic.   Eyes:      Conjunctiva/sclera: Conjunctivae normal.   Cardiovascular:      Rate and Rhythm: Normal rate and regular rhythm.      Heart sounds: No murmur heard.  Pulmonary:      Effort: Pulmonary effort is normal. No respiratory distress.      Breath sounds: Normal breath sounds.   Abdominal:      Palpations: Abdomen is  soft.      Tenderness: There is no abdominal tenderness.   Musculoskeletal:         General: No swelling.      Cervical back: Neck supple.   Skin:     General: Skin is warm and dry.      Capillary Refill: Capillary refill takes less than 2 seconds.   Neurological:      Mental Status: She is alert.   Psychiatric:         Mood and Affect: Mood normal.         Labs:  Results for orders placed or performed during the hospital encounter of 01/22/24 (from the past 24 hours)   POC BLOOD GLUCOSE (RESULTS)    Collection Time: 01/23/24  3:22 PM   Result Value Ref Range    GLUCOSE, POC 101 (H) 70 - 100 mg/dl   POC BLOOD GLUCOSE (RESULTS)    Collection Time: 01/23/24  8:32 PM   Result Value Ref Range    GLUCOSE, POC 98 70 - 100 mg/dl   POC BLOOD GLUCOSE (RESULTS)    Collection Time: 01/24/24  4:29 AM   Result Value Ref Range    GLUCOSE, POC 136 (H) 70 - 100 mg/dl   MAGNESIUM     Collection Time:  01/24/24 10:29 AM   Result Value Ref Range    MAGNESIUM  2.0 1.9 - 2.7 mg/dL   COMPREHENSIVE METABOLIC PANEL, NON-FASTING    Collection Time: 01/24/24 10:29 AM   Result Value Ref Range    SODIUM 143 136 - 145 mmol/L    POTASSIUM 5.1 3.5 - 5.1 mmol/L    CHLORIDE 111 (H) 98 - 107 mmol/L    CO2 TOTAL 25 21 - 31 mmol/L    ANION GAP 7 4 - 13 mmol/L    BUN 38 (H) 7 - 25 mg/dL    CREATININE 8.00 (H) 0.60 - 1.30 mg/dL    BUN/CREA RATIO 19 6 - 22    ESTIMATED GFR 24 (L) >59 mL/min/1.79m^2    ALBUMIN 3.3 (L) 3.5 - 5.7 g/dL    CALCIUM 8.9 8.6 - 89.6 mg/dL    GLUCOSE 869 (H) 74 - 109 mg/dL    ALKALINE PHOSPHATASE 66 34 - 104 U/L    ALT (SGPT) 9 7 - 52 U/L    AST (SGOT) 13 13 - 39 U/L    BILIRUBIN TOTAL 0.4 0.3 - 1.0 mg/dL    PROTEIN TOTAL 6.3 (L) 6.4 - 8.9 g/dL    ALBUMIN/GLOBULIN RATIO 1.1 0.8 - 1.4    OSMOLALITY, CALCULATED 296 (H) 270 - 290 mOsm/kg    CALCIUM, CORRECTED 9.5 8.9 - 10.8 mg/dL    GLOBULIN 3.0 2.0 - 3.5    Narrative    Estimated Glomerular Filtration Rate (eGFR) is calculated using the CKD-EPI (2021) equation, intended for patients 13 years of age and older. If gender is not documented or unknown, there will be no eGFR calculation.     CBC/DIFF    Collection Time: 01/24/24 10:29 AM    Narrative    The following orders were created for panel order CBC/DIFF.  Procedure                               Abnormality         Status                     ---------                               -----------         ------  CBC WITH DIFF[[id number]]                Abnormal            Final result                 Please view results for these tests on the individual orders.   CBC WITH DIFF    Collection Time: 01/24/24 10:29 AM   Result Value Ref Range    WBC 3.9 3.8 - 11.8 x10^3/uL    RBC 4.15 3.63 - 4.92 x10^6/uL    HGB 11.9 10.9 - 14.3 g/dL    HCT 63.7 68.7 - 58.0 %    MCV 87.4 75.5 - 95.3 fL    MCH 28.6 24.7 - 32.8 pg    MCHC 32.8 32.3 - 35.6 g/dL    RDW 83.9 87.6 - 82.2 %    PLATELETS 148 140 - 440  x10^3/uL    MPV 10.3 7.9 - 10.8 fL    NEUTROPHIL % 55 43 - 77 %    LYMPHOCYTE % 29 16 - 46 %    MONOCYTE % 13 (H) 4 - 11 %    EOSINOPHIL % 2 1 - 7 %    BASOPHIL % 1 0 - 1 %    NEUTROPHIL # 2.20 1.90 - 8.20 x10^3/uL    LYMPHOCYTE # 1.10 1.10 - 3.10 x10^3/uL    MONOCYTE # 0.50 0.20 - 0.90 x10^3/uL    EOSINOPHIL # 0.10 0.00 - 0.50 x10^3/uL    BASOPHIL # 0.00 0.00 - 0.10 x10^3/uL   BASIC METABOLIC PANEL    Collection Time: 01/24/24 10:29 AM   Result Value Ref Range    SODIUM 143 136 - 145 mmol/L    POTASSIUM 5.1 3.5 - 5.1 mmol/L    CHLORIDE 111 (H) 98 - 107 mmol/L    CO2 TOTAL 25 21 - 31 mmol/L    ANION GAP 7 4 - 13 mmol/L    CALCIUM 8.9 8.6 - 10.3 mg/dL    GLUCOSE 869 (H) 74 - 109 mg/dL    BUN 38 (H) 7 - 25 mg/dL    CREATININE 8.00 (H) 0.60 - 1.30 mg/dL    BUN/CREA RATIO 19 6 - 22    ESTIMATED GFR 24 (L) >59 mL/min/1.76m^2    OSMOLALITY, CALCULATED 296 (H) 270 - 290 mOsm/kg    Narrative    Estimated Glomerular Filtration Rate (eGFR) is calculated using the CKD-EPI (2021) equation, intended for patients 54 years of age and older. If gender is not documented or unknown, there will be no eGFR calculation.     POC BLOOD GLUCOSE (RESULTS)    Collection Time: 01/24/24 10:32 AM   Result Value Ref Range    GLUCOSE, POC 135 (H) 70 - 100 mg/dl            Imaging:             Microbiology:  Hospital Encounter on 01/22/24 (from the past 96 hours)   URINE CULTURE,ROUTINE    Collection Time: 01/22/24  2:40 PM    Specimen: Urine, Site not specified   Culture Result Status    URINE CULTURE 5,000 CFU/mL Mixed Flora (A) Final    Narrative    These results may or may not reflect clinically significant isolates because there is contamination with the usual skin flora that could interfere  with the growth of potential pathogens.             Assessment/ Plan:  Active Hospital Problems    Diagnosis    Primary Problem: AKI (acute kidney injury) (CMS HCC)    Stage 3b chronic kidney disease (CMS HCC)    Acute renal failure on top of chronic  kidney disease with possible ATN  Chronic kidney disease stage IIIB at baseline  Diabetes  Recent history of hypoglycemia  Probable protein calorie malnutrition  Chronic diastolic CHF with preserved ejection fraction  Borderline elevated troponin likely secondary to chronic kidney disease  Moderate pulmonary hypertension  Obesity  Hypothyroidism  Hypertension  Bilateral lower extremity edema, rule out deep vein thrombosis    Creatinine is improving, creatinine is down to 2  Bilateral lower extremity still swollen, may need diuresis, we will wait for Dopplers  We will wait for Dopplers, we will give 1 dose of albumin, may need Lasix  L  UA growing mixed flora  Chest x-ray within normal limits, CT brain within normal limits  Ultrasound kidneys showed symmetric bilateral decreased renal size, no acute hydronephrosis  EF from 2 weeks back was 50-55%, moderately dilated left atrium      Deep vein thrombosis prophylaxis: Subcu heparin     Nutrition:   DIET DIABETIC Calorie amount: CC 1800; Do you want to initiate MNT Protocol? Yes; Additional modifications/limitations: 2 G SODIUM  GUEST TRAY             Additional clinical characteristics related to nutrition:    - monitor for weight changes   - monitor intake and output    - monitor bowel functions        Lab Results   Component Value Date    ALBUMIN 3.3 (L) 01/24/2024           Disposition Planning:   Based on clinical course    Connie Applebaum, MD    This note was partially generated using MModal Fluency Direct system, and there may be some incorrect words, spellings, and punctuation that were not noted in checking the note before saving.

## 2024-01-24 NOTE — Telemedicine Consult (Addendum)
 E-Consult Note      Service: Nephrology    Requesting service/physician: Hospitalist Service    Reason for consult:  Acute on chronic kidney disease stage IV    Connie Barber  10/14/1934  89 y.o., female  Date of Service: 01/24/2024    HPI: Connie  M Barber is a 89 y.o., Black or [place] American female who presents with chronic kidney disease stage 4 baseline creatinine 1.7 presented to the hospital with headaches, dizziness, nausea and edema of the both lower extremities.  Patient has history of diabetes hypertension and chronic kidney disease as mentioned above.    Recommendations:   Acute on chronic kidney disease stage 4   -Baseline creatinine 1.7.    -current serum creatinine is not available we will order lab work today   -serum creatinine on admission 4.4.  -we will continue IV fluids at the current rate depending on the lab work we will give further recommendations.  -renal ultrasound no hydronephrosis but small kidneys   -UA was reviewed.  Culture was reviewed   -for hypertension I will continue to hold losartan start metoprolol 25 b.i.d..    -no need for renal replacement therapy.    Past Medical:  Past Medical History:   Diagnosis Date    CKD (chronic kidney disease)     Stage 3b    Coronary artery disease     Diabetes mellitus     Dyslipidemia     Essential hypertension     Hepatitis C     Wears dentures     Wears glasses             Vital Signs:Temperature: 36.3 C (97.4 F)  Heart Rate: (!) 46  BP (Non-Invasive): (!) 133/54  Respiratory Rate: 16  SpO2: 96 %    I&O:   Intake/Output Summary (Last 24 hours) at 01/24/2024 0933  Last data filed at 01/24/2024 0549  Gross per 24 hour   Intake 2506.67 ml   Output --   Net 2506.67 ml     No intake/output data recorded.    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    Colchicine -Probenecid  500-0.5 mg Oral Tablet    Take 1 Tablet by mouth Every evening    cyanocobalamin  (VITAMIN B 12) 1,000 mcg Oral Tablet    Take 1 Tablet (1,000 mcg total) by mouth Daily    ergocalciferol , vitamin D2, (DRISDOL) 1,250 mcg (50,000 unit) Oral Capsule    Take 1 Capsule (50,000 Units total) by mouth Every 7 days    esomeprazole magnesium  (NEXIUM) 40 mg Oral Capsule, Delayed Release(E.C.)    Take 1 Capsule (40 mg total) by mouth Once per day as needed for Other    ezetimibe  (ZETIA ) 10 mg Oral Tablet    Take 1 Tablet (10 mg total) by mouth Every morning for 180 days    hydroCHLOROthiazide (HYDRODIURIL) 12.5 mg Oral Tablet    Take 1 Tablet (12.5 mg total) by mouth Once a day  levothyroxine  (SYNTHROID ) 75 mcg Oral Tablet    Take 1 Tablet (75 mcg total) by mouth Every morning    magnesium  oxide (MAG-OX) 400 mg Oral Tablet    Take 1 Tablet (400 mg total) by mouth Twice daily for 7 days    olmesartan  (BENICAR ) 40 mg Oral Tablet    Take 1 Tablet (40 mg total) by mouth Daily    SITagliptin phosphate (JANUVIA) 50 mg Oral Tablet    Take 1 Tablet (50 mg total) by mouth Daily            Current Meds:  acetaminophen  (TYLENOL ) tablet, 650 mg, Oral, Q4H PRN  aluminum-magnesium  hydroxide-simethicone (MAG-AL PLUS) 200-200-20 mg per 5 mL oral liquid, 30 mL, Oral, Q4H PRN  aspirin  chewable tablet 81 mg, 81 mg, Oral, Daily  calcitriol  (ROCALTROL ) capsule, 0.25 mcg, Oral, Daily  Correction/SSIP insulin  lispro 100 units/mL injection, 1-5 Units, Subcutaneous, 4x/day AC  cyanocobalamin  (VITAMIN B12) tablet, 1,000 mcg, Oral, Daily  docusate sodium (COLACE) capsule, 100 mg, Oral, 2x/day PRN  heparin  5,000 unit/mL injection, 5,000 Units, Subcutaneous, Q8HRS  levothyroxine  (SYNTHROID ) tablet 75 mcg, 75 mcg,  Oral, QAM  [Held by provider] losartan (COZAAR) tablet, 50 mg, Oral, Daily  NS premix infusion, , Intravenous, Continuous  ondansetron  (ZOFRAN ) 2 mg/mL injection, 4 mg, Intravenous, Q6H PRN  pantoprazole  (PROTONIX ) delayed release tablet, 40 mg, Oral, Daily        LABS:    CBC:       \     /          /   \          BMP:            /               \             Recent Results (from the past 824799999 hours)   URINALYSIS, MICROSCOPIC    Collection Time: 01/22/24  2:40 PM   Result Value    RBCS Not Present    BACTERIA Few (A)    MUCOUS Few    WBCS Occasional    SQUAMOUS EPITHELIAL Few   URINALYSIS, MACRO/MICRO    Collection Time: 01/22/24  2:40 PM   Result Value    COLOR Light Yellow    APPEARANCE Clear    SPECIFIC GRAVITY 1.010    PH 6.5    LEUKOCYTES Trace (A)    NITRITE Negative    PROTEIN Negative    GLUCOSE Negative    KETONES Negative    BILIRUBIN Negative    BLOOD Negative    UROBILINOGEN 0.2   URINALYSIS WITH REFLEX MICROSCOPIC AND CULTURE IF POSITIVE    Collection Time: 01/22/24  2:40 PM    Specimen: Urine, Site not specified    Narrative    The following orders were created for panel order URINALYSIS WITH REFLEX MICROSCOPIC AND CULTURE IF POSITIVE.  Procedure                               Abnormality         Status                     ---------                               -----------         ------  URINALYSIS, MACRO/MICRO[[id number]]      Abnormal            Final result               URINALYSIS, MICROSCOPIC[[id number]]      Abnormal            Final result                 Please view results for these tests on the individual orders.       Imaging:      Recent Results (from the past 2400 hours)   TRANSTHORACIC ECHOCARDIOGRAM - ADULT   Result Value Ref Range    EF VISUAL ESTIMATE 50     EF 55      No results found for this or any previous visit (from the past 824799999 hours).    Orders:     MY ORDERS LAST 24 (24h ago, onward)       Start     Ordered    01/24/24 0945  BASIC METABOLIC PANEL   ONE TIME         01/24/24 0932    01/24/24 0945  STRICT I & O QSHIFT  QSHIFT         01/24/24 0933                    Choice of case was discussed with .    Time Spent: 10 minutes    Billing E-consult

## 2024-01-25 DIAGNOSIS — I272 Pulmonary hypertension, unspecified: Secondary | ICD-10-CM

## 2024-01-25 DIAGNOSIS — Z029 Encounter for administrative examinations, unspecified: Secondary | ICD-10-CM

## 2024-01-25 DIAGNOSIS — R7989 Other specified abnormal findings of blood chemistry: Secondary | ICD-10-CM

## 2024-01-25 DIAGNOSIS — R609 Edema, unspecified: Secondary | ICD-10-CM

## 2024-01-25 DIAGNOSIS — N184 Chronic kidney disease, stage 4 (severe): Secondary | ICD-10-CM

## 2024-01-25 DIAGNOSIS — R600 Localized edema: Secondary | ICD-10-CM

## 2024-01-25 DIAGNOSIS — Z79899 Other long term (current) drug therapy: Secondary | ICD-10-CM

## 2024-01-25 DIAGNOSIS — R001 Bradycardia, unspecified: Secondary | ICD-10-CM

## 2024-01-25 DIAGNOSIS — R9431 Abnormal electrocardiogram [ECG] [EKG]: Secondary | ICD-10-CM

## 2024-01-25 LAB — CBC WITH DIFF
BASOPHIL #: 0 x10ˆ3/uL (ref 0.00–0.10)
BASOPHIL %: 0 % (ref 0–1)
EOSINOPHIL #: 0.1 x10ˆ3/uL (ref 0.00–0.50)
EOSINOPHIL %: 2 % (ref 1–7)
HCT: 36.9 % (ref 31.2–41.9)
HGB: 12.2 g/dL (ref 10.9–14.3)
LYMPHOCYTE #: 1.2 x10ˆ3/uL (ref 1.10–3.10)
LYMPHOCYTE %: 17 % (ref 16–46)
MCH: 28.7 pg (ref 24.7–32.8)
MCHC: 33 g/dL (ref 32.3–35.6)
MCV: 86.8 fL (ref 75.5–95.3)
MONOCYTE #: 0.7 x10ˆ3/uL (ref 0.20–0.90)
MONOCYTE %: 10 % (ref 4–11)
MPV: 10.6 fL (ref 7.9–10.8)
NEUTROPHIL #: 5 x10ˆ3/uL (ref 1.90–8.20)
NEUTROPHIL %: 71 % (ref 43–77)
PLATELETS: 159 x10ˆ3/uL (ref 140–440)
RBC: 4.25 x10ˆ6/uL (ref 3.63–4.92)
RDW: 15.7 % (ref 12.3–17.7)
WBC: 7 x10ˆ3/uL (ref 3.8–11.8)

## 2024-01-25 LAB — COMPREHENSIVE METABOLIC PANEL, NON-FASTING
ALBUMIN/GLOBULIN RATIO: 1.3 (ref 0.8–1.4)
ALBUMIN: 4.1 g/dL (ref 3.5–5.7)
ALKALINE PHOSPHATASE: 62 U/L (ref 34–104)
ALT (SGPT): 12 U/L (ref 7–52)
ANION GAP: 5 mmol/L (ref 4–13)
AST (SGOT): 17 U/L (ref 13–39)
BILIRUBIN TOTAL: 0.4 mg/dL (ref 0.3–1.0)
BUN/CREA RATIO: 19 (ref 6–22)
BUN: 36 mg/dL — ABNORMAL HIGH (ref 7–25)
CALCIUM, CORRECTED: 9.6 mg/dL (ref 8.9–10.8)
CALCIUM: 9.7 mg/dL (ref 8.6–10.3)
CHLORIDE: 109 mmol/L — ABNORMAL HIGH (ref 98–107)
CO2 TOTAL: 28 mmol/L (ref 21–31)
CREATININE: 1.91 mg/dL — ABNORMAL HIGH (ref 0.60–1.30)
ESTIMATED GFR: 25 mL/min/1.73mˆ2 — ABNORMAL LOW (ref >59)
GLOBULIN: 3.1 (ref 2.0–3.5)
GLUCOSE: 88 mg/dL (ref 74–109)
OSMOLALITY, CALCULATED: 291 mosm/kg — ABNORMAL HIGH (ref 270–290)
POTASSIUM: 5 mmol/L (ref 3.5–5.1)
PROTEIN TOTAL: 7.2 g/dL (ref 6.4–8.9)
SODIUM: 142 mmol/L (ref 136–145)

## 2024-01-25 LAB — ECG 12 LEAD
Calculated T Axis: -30 degrees
QTC Calculation: 412 ms

## 2024-01-25 LAB — POC BLOOD GLUCOSE (RESULTS)
GLUCOSE, POC: 95 mg/dL (ref 70–100)
GLUCOSE, POC: 146 mg/dL — ABNORMAL HIGH (ref 70–100)
GLUCOSE, POC: 113 mg/dL — ABNORMAL HIGH (ref 70–100)

## 2024-01-25 LAB — MAGNESIUM: MAGNESIUM: 1.7 mg/dL — ABNORMAL LOW (ref 1.9–2.7)

## 2024-01-25 MED ORDER — FUROSEMIDE 40 MG TABLET
40.0000 mg | ORAL_TABLET | Freq: Every day | ORAL | 0 refills | Status: AC
Start: 2024-01-25 — End: 2024-02-24

## 2024-01-25 MED ORDER — HYDRALAZINE 10 MG TABLET
10.0000 mg | ORAL_TABLET | Freq: Three times a day (TID) | ORAL | Status: DC
Start: 2024-01-25 — End: 2024-01-25
  Administered 2024-01-25: 10 mg via ORAL
  Filled 2024-01-25: qty 1

## 2024-01-25 MED ORDER — FUROSEMIDE 40 MG TABLET
40.0000 mg | ORAL_TABLET | Freq: Every day | ORAL | Status: DC
Start: 2024-01-25 — End: 2024-01-26
  Administered 2024-01-25 – 2024-01-26 (×2): 40 mg via ORAL
  Filled 2024-01-25 (×2): qty 1

## 2024-01-25 MED ORDER — LOSARTAN 50 MG TABLET
50.0000 mg | ORAL_TABLET | Freq: Every day | ORAL | 0 refills | Status: AC
Start: 2024-01-25 — End: 2024-02-24

## 2024-01-25 NOTE — Consults (Signed)
 Connie [facility]  Palliative Care Nurse Practitioner  Consult Note    Bowerman, Lalonnie  Barber, 89 y.o. female  Date of Birth:  1935-02-18  MRN: [id number]  Admit Date: 01/22/2024   Attending: Hospitalist  Ashby Lansing, MD   Reason for Consult: communication with patient/family    Requesting provider: Shelton Reno MD    Chief Complaint: Fatigue, headache, dizziness, nausea, and bilateral lower extremity swelling.     HPI:  Connie  AMBRY [facility] is a 89 y.o. female who presented to [facility] on 01/22/24 with complaints of headache dizziness nausea and bilateral lower extremity swelling. Patient was found to be in acute renal failure related to her CKD, she was transferred to [facility] for further eval and treatment accordingly. Patient has had recent admission to [facility] with suspected hypoglycemia and diastolic CHF exacerbation. PMH includes but is not limited to CKD, CAD, DM, Ess HTN, and Hepatitis C. Labs: Mg 1.7, Cl 109, BUN 36, Creat 1.91, eGFR 25. US  Kidney with Bladder showed Symmetric bilateral decreased renal size. No acute findings or hydronephrosis. Unremarkable sonographic evaluation of the urinary bladder. CT Brain wo IV contrast did not show any acute findings. CXR did not show any new focal alveolar infiltrate.     Subjective: Patient was alert and pleasant. She stated that she is feeling better than when she came in the other day.    Review of Systems:  ROS: Other than ROS in the HPI, all other systems were negative.    Past Medical History:   Diagnosis Date    CKD (chronic kidney disease)     Stage 3b    Coronary artery disease     Diabetes mellitus     Dyslipidemia     Essential hypertension     Hepatitis C     Wears dentures     Wears glasses      Past Surgical History:   Procedure Laterality Date    CARDIAC CATHETERIZATION  11/20/2014    HX APPENDECTOMY      HX HYSTERECTOMY        Family Medical History:       Problem Relation (Age of Onset)    Heart Disease Mother    Hypertension (High Blood Pressure)  Mother    No Known Problems Sister, Brother, Maternal Grandmother, Maternal Grandfather, Paternal Grandmother, Paternal Grandfather, Daughter, Son, Maternal Aunt, Maternal Uncle, Paternal Aunt, Paternal Uncle, Other    Stroke Father          Social History     Socioeconomic History    Marital status: Widowed   Tobacco Use    Smoking status: Former     Types: Cigarettes    Smokeless tobacco: Never   Vaping Use    Vaping status: Never Used   Substance and Sexual Activity    Alcohol use: Not Currently    Drug use: Never     Social Determinants of Health     Social Connections: Low Risk  (01/22/2024)    Social Connections     SDOH Social Isolation: 5 or more times a week     Current Outpatient Medications   Medication Instructions    amLODIPine  (NORVASC ) 10 mg, Oral, Daily    aspirin  (ECOTRIN) 81 mg, Daily    calcitrioL  (ROCALTROL ) 0.25 mcg, Oral, Daily    Colchicine -Probenecid  500-0.5 mg Oral Tablet 1 Tablet, EVERY EVENING    cyanocobalamin  (VITAMIN B 12) 1,000 mcg, Daily    ergocalciferol  (vitamin D2) (DRISDOL) 50,000 Units, EVERY 7 DAYS  esomeprazole magnesium  (NEXIUM) 40 mg, DAILY PRN    ezetimibe  (ZETIA ) 10 mg, Oral, EVERY MORNING    hydroCHLOROthiazide (HYDRODIURIL) 12.5 mg, DAILY    levothyroxine  (SYNTHROID ) 75 mcg, EVERY MORNING    olmesartan  (BENICAR ) 40 mg, Daily    SITagliptin phosphate (JANUVIA) 50 mg, Oral, Daily      Allergies[1] No Known Allergies     Physical Exam:  Constitutional: appears in good health, comfortable  Respiratory: Breathing nonlabored, Clear to auscultation bilaterally.   Cardiovascular:    RRR  Gastrointestinal: non-distended, Soft, non-tender, hypo-active BS x 4 quads  Extremities: No ROM deficits  Integumentary:  Skin warm and dry  Neurologic: Grossly normal. Alert and oriented x3    BP (!) 155/66   Pulse 52   Temp 36.6 C (97.9 F)   Resp 18   Ht 1.6 Barber (5' 3)   Wt 75.3 kg (166 lb)   SpO2 95%   BMI 29.41 kg/Barber     Labs:  Lab Results Today:    Results for orders placed or  performed during the [facility] encounter of 01/22/24 (from the past 24 hours)   POC BLOOD GLUCOSE (RESULTS)   Result Value Ref Range    GLUCOSE, POC 90 70 - 100 mg/dl   POC BLOOD GLUCOSE (RESULTS)   Result Value Ref Range    GLUCOSE, POC 92 70 - 100 mg/dl   COMPREHENSIVE METABOLIC PANEL, NON-FASTING   Result Value Ref Range    SODIUM 142 136 - 145 mmol/L    POTASSIUM 5.0 3.5 - 5.1 mmol/L    CHLORIDE 109 (H) 98 - 107 mmol/L    CO2 TOTAL 28 21 - 31 mmol/L    ANION GAP 5 4 - 13 mmol/L    BUN 36 (H) 7 - 25 mg/dL    CREATININE 8.08 (H) 0.60 - 1.30 mg/dL    BUN/CREA RATIO 19 6 - 22    ESTIMATED GFR 25 (L) >59 mL/min/1.25m^2    ALBUMIN 4.1 3.5 - 5.7 g/dL    CALCIUM 9.7 8.6 - 89.6 mg/dL    GLUCOSE 88 74 - 890 mg/dL    ALKALINE PHOSPHATASE 62 34 - 104 U/L    ALT (SGPT) 12 7 - 52 U/L    AST (SGOT) 17 13 - 39 U/L    BILIRUBIN TOTAL 0.4 0.3 - 1.0 mg/dL    PROTEIN TOTAL 7.2 6.4 - 8.9 g/dL    ALBUMIN/GLOBULIN RATIO 1.3 0.8 - 1.4    OSMOLALITY, CALCULATED 291 (H) 270 - 290 mOsm/kg    CALCIUM, CORRECTED 9.6 8.9 - 10.8 mg/dL    GLOBULIN 3.1 2.0 - 3.5   MAGNESIUM    Result Value Ref Range    MAGNESIUM  1.7 (L) 1.9 - 2.7 mg/dL   CBC WITH DIFF   Result Value Ref Range    WBC 7.0 3.8 - 11.8 x10^3/uL    RBC 4.25 3.63 - 4.92 x10^6/uL    HGB 12.2 10.9 - 14.3 g/dL    HCT 63.0 68.7 - 58.0 %    MCV 86.8 75.5 - 95.3 fL    MCH 28.7 24.7 - 32.8 pg    MCHC 33.0 32.3 - 35.6 g/dL    RDW 84.2 87.6 - 82.2 %    PLATELETS 159 140 - 440 x10^3/uL    MPV 10.6 7.9 - 10.8 fL    NEUTROPHIL % 71 43 - 77 %    LYMPHOCYTE % 17 16 - 46 %    MONOCYTE % 10 4 - 11 %  EOSINOPHIL % 2 1 - 7 %    BASOPHIL % 0 0 - 1 %    NEUTROPHIL # 5.00 1.90 - 8.20 x10^3/uL    LYMPHOCYTE # 1.20 1.10 - 3.10 x10^3/uL    MONOCYTE # 0.70 0.20 - 0.90 x10^3/uL    EOSINOPHIL # 0.10 0.00 - 0.50 x10^3/uL    BASOPHIL # 0.00 0.00 - 0.10 x10^3/uL   POC BLOOD GLUCOSE (RESULTS)   Result Value Ref Range    GLUCOSE, POC 95 70 - 100 mg/dl   POC BLOOD GLUCOSE (RESULTS)   Result Value Ref Range     GLUCOSE, POC 146 (H) 70 - 100 mg/dl     Imaging Studies:  Images and Reports reviewed to current date.    ACP: Patient reports that her MPOA is her daughter - Richerd Collet, and she only she only makes the medical decisions for her, when she is unable to do so.       Code Status: Full code with aggressive treatment per patient.     GOC: This patient reports that he resides with her daughter Kemari Mares) at home. Patient reports that she is able to perform her own ADL's and her daughter helps with transportation whenever she needs it. Patient states that she is compliant with her PCP's plan of care. We discussed what OP Palliative Care was and what it would like look in assisting her in the community. We dicussed CHF, CKD. DM2, and education was provided on the possible symptom trajectories, dietary regimen along with medication compliance, and when to follow-up with PCP/specialists accordingly.  Patient agreed that she would like to have OP Palliative Care services upon discharge from [facility]. I informed her that our office would contact her once she was discharged from the [facility] to set up a date and time to admit her to the program, patient voiced understanding. I gave the patient my business card and invited her or her MPOA to call us  if they had any further questions or wanted to discuss any other issues.      PPS prior to hospitalization: 50%    PPS currently: 40%     Persons present and participating in discussion: Patient     Was the conversation voluntary? Yes     Patient Disposition/Discharge needs: Patient agreed that he would like to have OP Palliative Care services. I informed him that our office would contact him once he was discharged from the [facility] to set up a date and time to admit him to the program.     ACP/GOC time:     23 minutes     Total visit time:     52 minutes including ACP/GOC time, Face to face, reviewing medical records, and documentation time.     Thank you for this  consult.     Malachy ROCKFORD Letty Salvi, MSN, APRN - [facility]  Palliative Care Nurse Practitioner    This note may have been partially generated using Mmodal Fluency Direct system and there may be some incorrect words, spellings, and punctuation that were not noted in checking the note before saving.       [1] No Known Allergies

## 2024-01-25 NOTE — Progress Notes (Signed)
 [facility]  Nephrology Consult Progress Note           Barber, Connie  M  Date of Admission:  01/22/2024  Date of Birth:  Oct 14, 1934  Date of Service:  01/25/2024    Hospital Day:  LOS: 1 day       HPI/Subjective:   Connie  Connie Barber is a 89 y.o. female chronic kidney disease stage 4.  Baseline creatinine 1.7.    Patient remains to have edema.  But better than yesterday.  No nausea no vomiting.  No diarrhea.  Review of Systems  Systematic review of 12 organ systems was negative except what mentioned in in the HPI.  Vital Signs:  Temp (24hrs) Max:36.8 C (98.2 F)      Temperature: 36.6 C (97.9 F)  BP (Non-Invasive): (!) 155/66  MAP (Non-Invasive): 96 mmHG  Heart Rate: 52  Respiratory Rate: 18  SpO2: 95 %  I/O:  I/O last 24 hours:    Intake/Output Summary (Last 24 hours) at 01/25/2024 1225  Last data filed at 01/25/2024 1000  Gross per 24 hour   Intake 1757.5 ml   Output --   Net 1757.5 ml     Examination  Patient is alert awake and oriented not in acute distress.    Cardiovascular system: Regular rate and rhythm no murmurs rubs or gallops. No chest wall tenderness  Lungs: Clear to auscultation bilaterally no wheezing no rhonchi.  Abdomen soft nontender nondistended.  Extremities +1 edema  Neuro exam: EOMI, normal speech  Current Medications:  acetaminophen  (TYLENOL ) tablet, 650 mg, Oral, Q4H PRN  aluminum-magnesium  hydroxide-simethicone (MAG-AL PLUS) 200-200-20 mg per 5 mL oral liquid, 30 mL, Oral, Q4H PRN  aspirin  chewable tablet 81 mg, 81 mg, Oral, Daily  calcitriol  (ROCALTROL ) capsule, 0.25 mcg, Oral, Daily  Correction/SSIP insulin  lispro 100 units/mL injection, 1-5 Units, Subcutaneous, 4x/day AC  cyanocobalamin  (VITAMIN B12) tablet, 1,000 mcg, Oral, Daily  docusate sodium (COLACE) capsule, 100 mg, Oral, 2x/day PRN  furosemide  (LASIX ) tablet, 40 mg, Oral, Daily  heparin  5,000 unit/mL injection, 5,000 Units, Subcutaneous, Q8HRS  hydrALAZINE (APRESOLINE) tablet, 10 mg, Oral,  Q8HRS  levothyroxine  (SYNTHROID ) tablet 75 mcg, 75 mcg, Oral, QAM  [Held by provider] losartan (COZAAR) tablet, 50 mg, Oral, Daily  [Held by provider] metoprolol tartrate (LOPRESSOR) tablet, 25 mg, Oral, 2x/day  ondansetron  (ZOFRAN ) 2 mg/mL injection, 4 mg, Intravenous, Q6H PRN  pantoprazole  (PROTONIX ) delayed release tablet, 40 mg, Oral, Daily      Labs:  BMP:   142 (06/30 0541) 109* (06/30 0541) 36* (06/30 0541)    /     88 (06/30 0541)   5.0 (06/30 0541) 28 (06/30 0541) 1.91* (06/30 0541) \             CBC:     7.0 (06/30 0636) \   12.2 (06/30 0636) /   159 (06/30 0636)      / 36.9 (06/30 0636) \          Microbiology:  Hospital Encounter on 01/22/24 (from the past 96 hours)   URINE CULTURE,ROUTINE    Collection Time: 01/22/24  2:40 PM    Specimen: Urine, Site not specified   Culture Result Status    URINE CULTURE 5,000 CFU/mL Mixed Flora (A) Final    Narrative    These results may or may not reflect clinically significant isolates because there is contamination with the usual skin flora that could interfere  with the growth of potential pathogens.  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    -start Lasix  40 mg p.o. daily  -okay to restart losartan  -okay for discharge from Nephrology standpoint    Nephrology will sign off.        Dominique Hey, MD, FASN,  01/25/2024, 12:25

## 2024-01-25 NOTE — Discharge Summary (Signed)
 [facility]  DISCHARGE SUMMARY    PATIENT NAME:  Connie Barber, Connie Barber  MRN:  [id number]  DOB:  [birth year]    ENCOUNTER DATE:  01/22/2024  INPATIENT ADMISSION DATE: 01/24/2024  DISCHARGE DATE:  01/26/2024    ATTENDING PHYSICIAN: Novalynn Branaman, MD  SERVICE: PRN HOSPITALIST 1  PRIMARY CARE PHYSICIAN: Ashby Lansing, MD       No lay caregiver identified.    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        Details   furosemide  40 mg Tablet  Commonly known as: LASIX    40 mg, Oral, Daily  Qty: 30 Tablet  Refills: 0     losartan 50 mg Tablet  Commonly known as: COZAAR  Replaces: olmesartan  40 mg Tablet   50 mg, Oral, Daily  Qty: 30 Tablet  Refills: 0             CONTINUE these medications - NO CHANGES were made during your visit.        Details   amLODIPine  10 mg Tablet  Commonly known as: NORVASC    10 mg, Oral, Daily  Qty: 90 Tablet  Refills: 1     aspirin  81 mg Tablet, Delayed Release (E.C.)  Commonly known as: ECOTRIN   81 mg, Daily  Refills: 0     calcitrioL  0.25 mcg Capsule  Commonly known as: ROCALTROL    0.25 mcg, Oral, Daily  Qty: 90 Capsule  Refills: 0     Colchicine -Probenecid  500-0.5 mg Tablet   1 Tablet, EVERY EVENING  Refills: 0     cyanocobalamin  1,000 mcg Tablet  Commonly known as: VITAMIN B 12   1,000 mcg, Daily  Refills: 0     ergocalciferol  (vitamin D2) 1,250 mcg (50,000 unit) Capsule  Commonly known as: DRISDOL   50,000 Units, EVERY 7 DAYS  Refills: 0     esomeprazole magnesium  40 mg Capsule, Delayed Release(E.C.)  Commonly known as: NEXIUM   40 mg, DAILY PRN  Refills: 0     ezetimibe  10 mg Tablet  Commonly known as: ZETIA    10 mg, Oral, EVERY MORNING  Qty: 90 Tablet  Refills: 1     levothyroxine  75 mcg Tablet  Commonly known as: SYNTHROID    75 mcg, EVERY MORNING  Refills: 0     SITagliptin phosphate 50 mg Tablet  Commonly known as: JANUVIA   50 mg, Oral, Daily  Refills: 0            STOP taking these medications.      hydroCHLOROthiazide 12.5 mg Tablet  Commonly known as: HYDRODIURIL     olmesartan  40 mg Tablet  Commonly known as: BENICAR   Replaced by: losartan 50 mg Tablet            ASK your doctor about these medications.        Details   magnesium  oxide 400 mg Tablet  Commonly known as: MAG-OX  Ask about: Should I take this medication?   400 mg, Oral, 2 TIMES DAILY  Qty: 14 Tablet  Refills: 0            Discharge med list refreshed?  YES     Allergies[1]  HOSPITAL PROCEDURE(S):   No orders of the defined types were placed in this encounter.      REASON FOR HOSPITALIZATION AND HOSPITAL COURSE   BRIEF HPI:  This is a 89 y.o., female admitted for dizziness, nausea, bilateral LE swelling.  Patient was in the [facility] emergency department and found to  have AKI on chronic kidney disease with creatinine of 4.44 and BUN of 84.  Patient was admitted to [facility] for management of AKI on chronic kidney disease.  BRIEF HOSPITAL NARRATIVE:         Patient was managed for AKI on chronic kidney disease stage IIIB which was evident by creatinine elevation of 4.44 on admission, patient's workup showed ultrasound kidney symmetrical bilateral decreased renal size and no evidence of hydronephrosis.  Chest x-raywas negative.  deep vein thrombosis scan lower extremity was negative.  nephrology was on board and patient was managed with IV fluids patient's creatinine improved to 1.9. patient's baseline creatinine is 1.7.  Patient was cleared for discharge by Nephrology.  Patient was discharged on Lasix  40 p.o. daily and started on losartan 50 mg once a day.  DM - managed with sliding scale; discharge on home meds  Borderline elevated troponin likely secondary to chronic kidney disease  Moderate pulmonary hypertension - lasix  40 daily; o/p PCP f/u  Obesity - o/p PCP f/u  Hypothyroidism - resume home med  Hypertension - discharge on losartan 50 mg daily and home med Norvasc . D/c HCTZ on discharge and olmesartan   Bilateral lower extremity edema, rule out deep vein thrombosis    Patient worked with PT and recommended to go home with with home health and FWW. Patient discharge with home health and FWW    06/30  Patient seen and examined bedside. Doing well. No complaints    Physical Exam  Cardiovascular:      Rate and Rhythm: Normal rate and regular rhythm.      Pulses: Normal pulses.      Heart sounds: No murmur heard.  Pulmonary:      Effort: Pulmonary effort is normal. No respiratory distress.   Abdominal:      General: Abdomen is flat. There is no distension.      Tenderness: There is no abdominal tenderness.   Musculoskeletal:      Right lower leg: Edema present.      Left lower leg: Edema present.   Neurological:  Mental Status: She is alert.            TRANSITION/POST DISCHARGE CARE/PENDING TESTS/REFERRALS:   After patient Work with PT  F/u out patient with nephrology  Start taking lasix  40 mg daily  Take losartan 50 mg once a day  Hold hydrochlorothiazide and f/u with nephrology and PCP and resume if instructed.   Check BP daily and f/u with PCP for further titration of medication  Stop taking olmesartan   Resume rest of your medications as before  Return to the ER if symptoms worsen    CONDITION ON DISCHARGE:  A. Ambulation: PT eval tomorrow  B. Self-care Ability: Complete  C. Cognitive Status Oriented x 3  D. Code status at discharge:       LINES/DRAINS/WOUNDS AT DISCHARGE:   Patient Lines/Drains/Airways Status       Active Line / Dialysis Catheter / Dialysis Graft / Drain / Airway / Wound       None                    DISCHARGE DISPOSITION:  pending PT eval  DISCHARGE INSTRUCTIONS:  Post-Discharge Follow Up Appointments       Follow up with Maurita [place], MD    Phone: [phone]    Where: [facility]    Follow up with Helene Meter, MD    Phone: [phone]    Where: [facility]      Wednesday May 18, 2024    Lab with Lab, Nephrology Prn Arnold at 10:00 AM      Wednesday May 25, 2024    Return Patient Visit with Cathryne Iha, NP at 10:00 AM      Thursday Oct 27, 2024    Return Patient Visit with Ward, Garnette, MD at 10:45 AM      Cardiology [facility], [place]  [address]  [place] [address]  [phone] Nephrology, [facility]  [address] [place]  [address]  [place] [address]  [phone]             Refer to Home Health - EXTERNAL     DME - [facility] Front Wheeled    Please note - If patient is 300 lbs or greater please order bariatric or heavy duty items.     Ht 160 cm    Wt 77.52 kg    Current Attending: Dr Bedford Aurora    Medical Condition or Diagnosis which is primary reason  for equipment: AKI,CKD    Patient has mobility limits that significantly impairs ability to participate in one or more mobility related ADL's (MRADL's): Yes    Moblity Limitations: Pt unable to fulfill MRADL's & is able to safely use walker which resolves issue    Walker Type: Front Wheeled    Freedom of Choice: I have informed patient of their freedom of choice with respect to DME providers      [facility]          Yarethzy Croak, MD    Copies sent to Care Team         Relationship Specialty Notifications Start End    Bhasin, Sunita, MD PCP - General INTERNAL MEDICINE  10/28/21     Phone: [phone] Fax: [phone]         [address] [facility] [address]            Referring providers  can utilize [email or web address] to access their referred [facility] patient's information.                               [1] No Known Allergies

## 2024-01-26 DIAGNOSIS — N170 Acute kidney failure with tubular necrosis: Secondary | ICD-10-CM

## 2024-01-26 DIAGNOSIS — N189 Chronic kidney disease, unspecified: Secondary | ICD-10-CM

## 2024-01-26 LAB — CBC WITH DIFF
BASOPHIL #: 0 10*3/uL (ref 0.00–0.10)
BASOPHIL %: 1 % (ref 0–1)
EOSINOPHIL #: 0.1 10*3/uL (ref 0.00–0.50)
EOSINOPHIL %: 2 % (ref 1–7)
HCT: 37.4 % (ref 31.2–41.9)
HGB: 12.4 g/dL (ref 10.9–14.3)
LYMPHOCYTE #: 1.8 10*3/uL (ref 1.10–3.10)
LYMPHOCYTE %: 37 % (ref 16–46)
MCH: 29.1 pg (ref 24.7–32.8)
MCHC: 33.2 g/dL (ref 32.3–35.6)
MCV: 87.5 fL (ref 75.5–95.3)
MONOCYTE #: 0.7 10*3/uL (ref 0.20–0.90)
MONOCYTE %: 14 % — ABNORMAL HIGH (ref 4–11)
MPV: 10.4 fL (ref 7.9–10.8)
NEUTROPHIL #: 2.3 10*3/uL (ref 1.90–8.20)
NEUTROPHIL %: 47 % (ref 43–77)
PLATELETS: 150 10*3/uL (ref 140–440)
RBC: 4.27 10*6/uL (ref 3.63–4.92)
RDW: 16 % (ref 12.3–17.7)
WBC: 4.9 10*3/uL (ref 3.8–11.8)

## 2024-01-26 LAB — MAGNESIUM: MAGNESIUM: 1.5 mg/dL — ABNORMAL LOW (ref 1.9–2.7)

## 2024-01-26 LAB — COMPREHENSIVE METABOLIC PANEL, NON-FASTING
ALBUMIN/GLOBULIN RATIO: 1.3 (ref 0.8–1.4)
ALBUMIN: 3.8 g/dL (ref 3.5–5.7)
ALKALINE PHOSPHATASE: 59 U/L (ref 34–104)
ALT (SGPT): 17 U/L (ref 7–52)
ANION GAP: 9 mmol/L (ref 4–13)
AST (SGOT): 23 U/L (ref 13–39)
BILIRUBIN TOTAL: 0.4 mg/dL (ref 0.3–1.0)
BUN/CREA RATIO: 18 (ref 6–22)
BUN: 34 mg/dL — ABNORMAL HIGH (ref 7–25)
CALCIUM, CORRECTED: 9.4 mg/dL (ref 8.9–10.8)
CALCIUM: 9.2 mg/dL (ref 8.6–10.3)
CHLORIDE: 104 mmol/L (ref 98–107)
CO2 TOTAL: 27 mmol/L (ref 21–31)
CREATININE: 1.84 mg/dL — ABNORMAL HIGH (ref 0.60–1.30)
ESTIMATED GFR: 26 mL/min/{1.73_m2} — ABNORMAL LOW (ref >59)
GLOBULIN: 2.9 (ref 2.0–3.5)
GLUCOSE: 100 mg/dL (ref 74–109)
OSMOLALITY, CALCULATED: 287 mosm/kg (ref 270–290)
POTASSIUM: 4.8 mmol/L (ref 3.5–5.1)
PROTEIN TOTAL: 6.7 g/dL (ref 6.4–8.9)
SODIUM: 140 mmol/L (ref 136–145)

## 2024-01-26 LAB — POC BLOOD GLUCOSE (RESULTS)
GLUCOSE, POC: 74 mg/dL (ref 70–100)
GLUCOSE, POC: 92 mg/dL (ref 70–100)
GLUCOSE, POC: 145 mg/dL — ABNORMAL HIGH (ref 70–100)

## 2024-01-26 MED ORDER — MAGNESIUM SULFATE 1 GRAM/100 ML IN DEXTROSE 5 % INTRAVENOUS PIGGYBACK
1.0000 g | INJECTION | INTRAVENOUS | Status: AC
Start: 2024-01-26 — End: 2024-01-26
  Administered 2024-01-26: 0 g via INTRAVENOUS
  Administered 2024-01-26 (×2): 1 g via INTRAVENOUS
  Administered 2024-01-26 (×3): 0 g via INTRAVENOUS
  Administered 2024-01-26 (×2): 1 g via INTRAVENOUS
  Filled 2024-01-26 (×2): qty 100

## 2024-01-26 MED ORDER — AMLODIPINE 10 MG TABLET
10.0000 mg | ORAL_TABLET | Freq: Every day | ORAL | Status: DC
Start: 2024-01-26 — End: 2024-01-26
  Administered 2024-01-26: 10 mg via ORAL
  Filled 2024-01-26: qty 1

## 2024-01-26 NOTE — Nurses Notes (Signed)
 Patient discharged home with family.  AVS reviewed with patient/care giver.  A written copy of the AVS and discharge instructions was given to the patient/care giver. Scripts handed to patient/care giver. Questions sufficiently answered as needed.  Patient/care giver encouraged to follow up with PCP as indicated.  In the event of an emergency, patient/care giver instructed to call 911 or go to the nearest emergency room.

## 2024-01-26 NOTE — Care Management Notes (Signed)
Referral Information  ++++++ Placed Provider #1 ++++++  Case Manager: Traci Thompson  Provider Type: Home Health  Provider Name: [facility] [place]  Address:  [address], [address]  [place], [place] [address]  Contact: Mary Werner    Phone: [phone] x  Fax:   Fax: [phone]

## 2024-01-26 NOTE — Progress Notes (Signed)
 [facility]   Progress Note    Connie  MESCAL Barber  Date of service: 01/26/2024  Date of Admission:  01/22/2024  Hospital Day:  LOS: 2 days     Subjective:   Patient seen and examined bedside. No acute events overnight. No complaints.     Review of Systems:    Focused review of system was completed. Refer to the HPI for ROS details.     Objective:      Vital Signs:  Vitals:    01/25/24 2359 01/26/24 0537 01/26/24 0600 01/26/24 0759   BP: 139/63 (!) 159/67  (!) 167/59   Pulse: 56 69  50   Resp: 16 16  18    Temp: 36.4 C (97.6 F) 36.4 C (97.6 F)  36.3 C (97.3 F)   SpO2: 95% 92%  96%   Weight:   77.5 kg (170 lb 14.4 oz)    Height:       BMI:                I/O:  I/O last 24 hours:    Intake/Output Summary (Last 24 hours) at 01/26/2024 1154  Last data filed at 01/26/2024 1146  Gross per 24 hour   Intake 460 ml   Output --   Net 460 ml     I/O current shift:  07/01 0700 - 07/01 1859  In: 20   Out: -     Physical Exam:    Physical Exam  Cardiovascular:      Rate and Rhythm: Normal rate and regular rhythm.      Pulses: Normal pulses.   Pulmonary:      Effort: Pulmonary effort is normal. No respiratory distress.      Breath sounds: No wheezing.   Abdominal:      General: Abdomen is flat. There is no distension.      Tenderness: There is no abdominal tenderness.   Musculoskeletal:      Right lower leg: Edema present.      Left lower leg: Edema present.   Neurological:      Mental Status: She is alert.         Labs:  Results for orders placed or performed during the hospital encounter of 01/22/24 (from the past 24 hours)   POC BLOOD GLUCOSE (RESULTS)    Collection Time: 01/25/24  3:07 PM   Result Value Ref Range    GLUCOSE, POC 113 (H) 70 - 100 mg/dl   CBC/DIFF    Collection Time: 01/26/24  5:30 AM    Narrative    The following orders were created for panel order CBC/DIFF.  Procedure                               Abnormality         Status                     ---------                                -----------         ------                     CBC WITH IPQQ[[id number]]                Abnormal  Final result                 Please view results for these tests on the individual orders.   COMPREHENSIVE METABOLIC PANEL, NON-FASTING    Collection Time: 01/26/24  5:30 AM   Result Value Ref Range    SODIUM 140 136 - 145 mmol/L    POTASSIUM 4.8 3.5 - 5.1 mmol/L    CHLORIDE 104 98 - 107 mmol/L    CO2 TOTAL 27 21 - 31 mmol/L    ANION GAP 9 4 - 13 mmol/L    BUN 34 (H) 7 - 25 mg/dL    CREATININE 8.15 (H) 0.60 - 1.30 mg/dL    BUN/CREA RATIO 18 6 - 22    ESTIMATED GFR 26 (L) >59 mL/min/1.70m^2    ALBUMIN 3.8 3.5 - 5.7 g/dL    CALCIUM 9.2 8.6 - 89.6 mg/dL    GLUCOSE 899 74 - 890 mg/dL    ALKALINE PHOSPHATASE 59 34 - 104 U/L    ALT (SGPT) 17 7 - 52 U/L    AST (SGOT) 23 13 - 39 U/L    BILIRUBIN TOTAL 0.4 0.3 - 1.0 mg/dL    PROTEIN TOTAL 6.7 6.4 - 8.9 g/dL    ALBUMIN/GLOBULIN RATIO 1.3 0.8 - 1.4    OSMOLALITY, CALCULATED 287 270 - 290 mOsm/kg    CALCIUM, CORRECTED 9.4 8.9 - 10.8 mg/dL    GLOBULIN 2.9 2.0 - 3.5    Narrative    Estimated Glomerular Filtration Rate (eGFR) is calculated using the CKD-EPI (2021) equation, intended for patients 18 years of age and older. If gender is not documented or unknown, there will be no eGFR calculation.     MAGNESIUM     Collection Time: 01/26/24  5:30 AM   Result Value Ref Range    MAGNESIUM  1.5 (L) 1.9 - 2.7 mg/dL   CBC WITH DIFF    Collection Time: 01/26/24  5:30 AM   Result Value Ref Range    WBC 4.9 3.8 - 11.8 x10^3/uL    RBC 4.27 3.63 - 4.92 x10^6/uL    HGB 12.4 10.9 - 14.3 g/dL    HCT 62.5 68.7 - 58.0 %    MCV 87.5 75.5 - 95.3 fL    MCH 29.1 24.7 - 32.8 pg    MCHC 33.2 32.3 - 35.6 g/dL    RDW 83.9 87.6 - 82.2 %    PLATELETS 150 140 - 440 x10^3/uL    MPV 10.4 7.9 - 10.8 fL    NEUTROPHIL % 47 43 - 77 %    LYMPHOCYTE % 37 16 - 46 %    MONOCYTE % 14 (H) 4 - 11 %    EOSINOPHIL % 2 1 - 7 %    BASOPHIL % 1 0 - 1 %    NEUTROPHIL # 2.30 1.90 - 8.20 x10^3/uL    LYMPHOCYTE # 1.80 1.10 -  3.10 x10^3/uL    MONOCYTE # 0.70 0.20 - 0.90 x10^3/uL    EOSINOPHIL # 0.10 0.00 - 0.50 x10^3/uL    BASOPHIL # 0.00 0.00 - 0.10 x10^3/uL   POC BLOOD GLUCOSE (RESULTS)    Collection Time: 01/26/24  7:56 AM   Result Value Ref Range    GLUCOSE, POC 92 70 - 100 mg/dl   POC BLOOD GLUCOSE (RESULTS)    Collection Time: 01/26/24 11:37 AM   Result Value Ref Range    GLUCOSE, POC 145 (H) 70 - 100 mg/dl  Imaging:           Microbiology:  Hospital Encounter on 01/22/24 (from the past 96 hours)   URINE CULTURE,ROUTINE    Collection Time: 01/22/24  2:40 PM    Specimen: Urine, Site not specified   Culture Result Status    URINE CULTURE 5,000 CFU/mL Mixed Flora (A) Final    Narrative    These results may or may not reflect clinically significant isolates because there is contamination with the usual skin flora that could interfere  with the growth of potential pathogens.           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  Chest x-raywas negative.  deep vein thrombosis scan lower extremity was negative.  nephrology was on board and patient was managed with IV fluids patient's creatinine improved to 1.9. patient's baseline creatinine is 1.7.  Patient was cleared for discharge by Nephrology.  Patient was discharged on Lasix  40 p.o. daily and started on losartan 50 mg once a day. Creatinine 1.84 on discharge.     Any further intervention will be determined by hospital course.        DVT prophylaxis - heparins s.c  GI prophylaxis - protonix   Consult PT/care management   Consult - nephro       Nutrition:     DIET DIABETIC Calorie amount: CC 1800; Do you want to initiate MNT Protocol? Yes; Additional modifications/limitations: 2 G SODIUM  GUEST TRAY    Additional clinical characteristics related to nutrition:    - monitor for weight changes   - monitor intake and output    - monitor bowel functions                 Lab Results   Component Value Date    ALBUMIN 3.8 01/26/2024        Disposition Planning:   Discharge today.    Bedford Aurora, MD    This note was partially generated using MModal Fluency Direct system, and there may be some incorrect words, spellings, and punctuation that were not noted in checking the note before saving.

## 2024-01-26 NOTE — PT Evaluation (Signed)
 .. [facility]  [address]  [place], [address]  [phone]  (Fax) [phone]  Rehabilitation Services  Physical Therapy Inpatient Initial Evaluation    Patient Name: Connie  ABIGAELLE Barber  Date of Birth: July 11, 1935  Height: Height: 160 cm (5' 3)  Weight: Weight: 77.5 kg (170 lb 14.4 oz)  Room/Bed: 316/B  Payor: [employer] MEDICARE / Plan: [employer] CHOICE PPO / Product Type: PPO /       PMH:  Past Medical History:   Diagnosis Date    CKD (chronic kidney disease)     Stage 3b    Coronary artery disease     Diabetes mellitus     Dyslipidemia     Essential hypertension     Hepatitis C     Wears dentures     Wears glasses            Assessment:      (P) Pt presents with mild generalized weakness, reduced activity tolerance affecting balance, needing some intermittent UE support. Can complete bedlevel and standing transfers indep to setup assist. Amb x250 ft at handheld assist to use as cane, only needed frequently, needed standing pause break for 1-2 mins after 120 ft, can carry a conversation during gait. Pt and family education on working with [facility] PT to gradually transition pt back to community setting, but pt and dtr said they could do the walking program at home themselves.    Total Distance Ambulated: (P) 250  Independence: (P) supervision required  Assistive Device: (P) hand-held assistance      Discharge Needs:    Equipment Recommendation: (P) front wheeled walker      The patient DOES NOT present with mobility limitations that significantly impair/prevent patient's ability to participate in mobility-related activities of daily living (MRADLs).     Discharge Disposition: (P) home with assist, home with home health    JUSTIFICATION OF DISCHARGE RECOMMENDATION   Based on current diagnosis, functional performance prior to admission, and current functional performance, this patient DOES NOT require continued PT services at this time DURING HOSPITALIZATION. See discharge  disposition above.      Plan:   Evaluation only.  Recommend continued ambulation with Restorative Aides upon clearance by charge/attending nurse each session.    The risks/benefits of therapy have been discussed with the patient/caregiver and he/she is in agreement with the established plan of care.       Subjective & Objective     Past Medical History:   Diagnosis Date    CKD (chronic kidney disease)     Stage 3b    Coronary artery disease     Diabetes mellitus     Dyslipidemia     Essential hypertension     Hepatitis C     Wears dentures     Wears glasses             Past Surgical History:   Procedure Laterality Date    CARDIAC CATHETERIZATION  11/20/2014    HX APPENDECTOMY      HX HYSTERECTOMY                   01/26/24 1006   Rehab Session   Document Type evaluation   PT Visit Date 01/26/24   General Information   Patient Profile Reviewed yes   Pertinent History of Current Functional Problem Pt is an 88-YO female ADM on 01/22/24 for acute kidney injury   Medical Lines Telemetry   Respiratory Status room  air   Existing Precautions/Restrictions fall precautions   Mutuality/Individual Preferences   Anxieties, Fears or Concerns None voiced at this time   Individualized Care Needs up in chair   Patient-Specific Goals (Include Timeframe) DC home with family and HH services   Plan of Care Reviewed With patient   Living Environment   Lives With child(ren), adult   Living Arrangements house   Home Assessment: Stairs in Home   Home Accessibility bed not on first floor;stairs to enter home;stairs within home;tub/shower is not walk in;bed and bath on same level;grab bars present (bathtub)   Stairs Within Home, Primary   Stairs, Within Home, Primary 13 to her bedroom with baths same level   Number of Stairs, Within Home, Primary other (see comments)  (13)   Stair Railings, Within Home, Primary railing on right side (ascending)   Home Main Entrance   Number of Stairs, Main Entrance ten   Stair Railings, Main Entrance railings  on both sides of stairs   Functional Level Prior   Ambulation 1 - assistive equipment  (hurrycane)   Transferring 0 - independent   Toileting 0 - independent   Bathing 1 - assistive equipment  (grab bar only)   Dressing 0 - independent   Eating 0 - independent   Communication 0 - understands/communicates without difficulty   Swallowing 0-->swallows foods/liquids without difficulty   Prior Functional Level Comment Pt is amb with hurrycane outdoors, indep with ADLs, needs some help to get in/OO bathtub, otherwise indep. Can drive, go to the store. Reports she can do the stairs indep most of the time. On room air.   Pre Treatment Status   Pre Treatment Patient Status Patient supine in bed;Nurse approved session;Call light within reach   Support Present Pre Treatment  Family present   Communication Pre Treatment  Charge Nurse   Communication Pre Treatment Comment clear   Cognitive Assessment/Interventions   Behavior/Mood Observations alert;behavior appropriate to situation, WNL/WFL;cooperative   Orientation Status oriented x 4   Attention WNL/WFL   Follows Commands WFL   Pre- Treatment Vital Signs   Pre-Treatment Heart Rate (beats/min) 59   Pre SpO2 (%) 97   O2 Delivery Pre Treatment room air   Pre-Treatment Pain   Pretreatment Pain Rating 0/10 - no pain   RUE Assessment   RUE Assessment WFL- Within Functional Limits   LUE Assessment   LUE Assessment WFL- Within Functional Limits   RLE Assessment   RLE Assessment WFL- Within Functional Limits   LLE Assessment   LLE Assessment WFL- Within Functional Limits   Trunk Assessment   Trunk Assessment WFL-Within Functional Limits   Mobility Assessment/Training   Additional Documentation Bed Mobility Assessment/Treatment (Group);Transfer Assessment/Treatment (Group);Gait Assessment/Treatment (Group)   Bed Mobility   Scoot/Bridge Independence modified independence   Supine-Sit Independence modified independence   Bed Mobility, Assistive Device bed rails   Impairments flexibility  decreased   Transfer Assessment/Treatment   Sit-Stand Independence modified independence   Stand-Sit Independence modified independence   Sit-Stand-Sit, Assist Device None   Bed-Chair Independence set up required   Bed-Chair-Bed Assist Device handheld assist   Transfer Safety Issues balance decreased during turns;step length decreased   Transfer Impairments balance impaired;flexibility decreased   Gait Assessment/Treatment   Total Distance Ambulated 250   Independence  supervision required   Assistive Device  hand-held assistance   Gait Speed slow to fair   Deviations  cadence decreased;double stance time increased;lateral shift;limb motion velocity decreased;step length decreased   Safety Issues  balance decreased during turns;step length decreased   Impairments  balance impaired;endurance;flexibility decreased   Motor Skills/Interventions   Additional Documentation Balance Skills Training (Group)   Balance   Sitting Balance: Static normal balance   Sitting, Dynamic (Balance) good balance   Sit-to-Stand Balance fair balance   Standing Balance: Static fair + balance   Standing Balance: Dynamic fair balance   Post Treatment Status   Post Treatment Patient Status Patient sitting on edge of bed;Call light within reach   Support Present Post Treatment  Family present   Communication [employer] features editor Treatment Comment updates   Patient Effort good   Post-Treatment Vital Signs   Post-treatment Heart Rate (beats/min) 71   Post SpO2 (%) 94   O2 Delivery Post Treatment room air   Post-Treatment Pain   Posttreatment Pain Rating 0/10 - no pain   Physical Therapy Clinical Impression   Assessment Pt presents with mild generalized weakness, reduced activity tolerance affecting balance, needing some intermittent UE support. Can complete bedlevel and standing transfers indep to setup assist. Amb x250 ft at handheld assist to use as cane, only needed frequently, needed standing pause  break for 1-2 mins after 120 ft, can carry a conversation during gait. Pt and family education on working with [facility] PT to gradually transition pt back to community setting, but pt and dtr said they could do the walking program at home themselves.   Criteria for Skilled Therapeutic does not meet criteria for skilled intervention   Impairments Found (describe specific impairments) aerobic capacity/endurance;gait, locomotion, and balance;muscle performance;posture   Functional Limitations in Following  self-care;home management;community/leisure   Rehab Potential good   Predicted Duration of Therapy Intervention (days/wks) evaluation only   Anticipated Equipment Needs at Discharge (PT) front wheeled walker   Anticipated Discharge Disposition home with assist;home with home health   Evaluation Complexity Justification   Patient History: Co-morbidity/factors that impact Plan of Care 3 or more that impact Plan of Care;Diabetes: altered sensation &/or impaired function;One or more other medical co-morbidity;Home accessibility barriers/factors;Patient/family compliance concerns;Age   Examination Components Vital signs (BP, HR, RR, or O2 saturation);Range of motion;Strength;Balance;Bed mobility;Transfers;Ambulation   Presentation Stable: Uncomplicated, straight-forward, problem focused   Clinical Decision Making Low complexity   Evaluation Complexity Low complexity   Planned Therapy Interventions, PT Eval   Planned Therapy Interventions (PT) home exercise program;patient/family education;postural re-education   Physical Therapy Time and Intention   Total PT Minutes: 16               INTERVENTION MINUTES: EVALUATION 16 minutes    EVALUATION COMPLEXITY : CLINICAL DECISION MAKING OF LOW COMPLEXITY AS INDICATED BY PMH, PHYSICAL THERAPY ASSESSMENT OF MUSCULOSKELETAL AND NEUROLOGICAL SYSTEMS AND ACTIVITY LIMITATIONS. CLINICAL PRESENTATION IS STABLE AND UNCOMPLICATED    Therapist:     Torra Pala, PT  01/26/2024, 12:02

## 2024-01-28 ENCOUNTER — Telehealth (HOSPITAL_COMMUNITY): Payer: Self-pay

## 2024-01-28 LAB — PARACHUTE DME

## 2024-02-15 ENCOUNTER — Ambulatory Visit

## 2024-02-15 ENCOUNTER — Other Ambulatory Visit: Payer: Self-pay

## 2024-02-15 DIAGNOSIS — E559 Vitamin D deficiency, unspecified: Secondary | ICD-10-CM | POA: Insufficient documentation

## 2024-02-15 DIAGNOSIS — E119 Type 2 diabetes mellitus without complications: Secondary | ICD-10-CM | POA: Insufficient documentation

## 2024-02-15 LAB — COMPREHENSIVE METABOLIC PANEL, NON-FASTING
ALBUMIN/GLOBULIN RATIO: 1.1 (ref 0.8–1.4)
ALBUMIN: 4.1 g/dL (ref 3.5–5.7)
ALKALINE PHOSPHATASE: 65 U/L (ref 34–104)
ALT (SGPT): 9 U/L (ref 7–52)
ANION GAP: 11 mmol/L (ref 4–13)
AST (SGOT): 16 U/L (ref 13–39)
BILIRUBIN TOTAL: 0.6 mg/dL (ref 0.3–1.0)
BUN/CREA RATIO: 17 (ref 6–22)
BUN: 42 mg/dL — ABNORMAL HIGH (ref 7–25)
CALCIUM, CORRECTED: 10 mg/dL (ref 8.9–10.8)
CALCIUM: 10.1 mg/dL (ref 8.6–10.3)
CHLORIDE: 104 mmol/L (ref 98–107)
CO2 TOTAL: 29 mmol/L (ref 21–31)
CREATININE: 2.43 mg/dL — ABNORMAL HIGH (ref 0.60–1.30)
ESTIMATED GFR: 19 mL/min/1.73mˆ2 — ABNORMAL LOW (ref >59)
GLOBULIN: 3.8 — ABNORMAL HIGH (ref 2.0–3.5)
GLUCOSE: 122 mg/dL — ABNORMAL HIGH (ref 74–109)
OSMOLALITY, CALCULATED: 299 mosm/kg — ABNORMAL HIGH (ref 270–290)
POTASSIUM: 4 mmol/L (ref 3.5–5.1)
PROTEIN TOTAL: 7.9 g/dL (ref 6.4–8.9)
SODIUM: 144 mmol/L (ref 136–145)

## 2024-02-15 LAB — CBC
HCT: 39.2 % (ref 31.2–41.9)
HGB: 13 g/dL (ref 10.9–14.3)
MCH: 28.5 pg (ref 24.7–32.8)
MCHC: 33.1 g/dL (ref 32.3–35.6)
MCV: 86 fL (ref 75.5–95.3)
MPV: 10.9 fL — ABNORMAL HIGH (ref 7.9–10.8)
PLATELETS: 137 x10ˆ3/uL — ABNORMAL LOW (ref 140–440)
RBC: 4.56 x10ˆ6/uL (ref 3.63–4.92)
RDW: 15.6 % (ref 12.3–17.7)
WBC: 4.7 x10ˆ3/uL (ref 3.8–11.8)

## 2024-02-15 LAB — LIPID PANEL
CHOL/HDL RATIO: 4
CHOLESTEROL: 135 mg/dL (ref <200)
HDL CHOL: 34 mg/dL — ABNORMAL LOW (ref >=40)
LDL CALC: 84 mg/dL (ref 0–100)
TRIGLYCERIDES: 86 mg/dL (ref <=150)
VLDL CALC: 17 mg/dL (ref 0–50)

## 2024-02-15 LAB — URINALYSIS, MACROSCOPIC
BILIRUBIN: NEGATIVE mg/dL
BLOOD: NEGATIVE mg/dL
GLUCOSE: NEGATIVE mg/dL
KETONES: NEGATIVE mg/dL
LEUKOCYTES: 250 WBCs/uL — AB
NITRITE: NEGATIVE
PH: 5 (ref 5.0–9.0)
PROTEIN: NEGATIVE mg/dL
SPECIFIC GRAVITY: 1.01 (ref 1.002–1.030)
UROBILINOGEN: NORMAL mg/dL

## 2024-02-15 LAB — URINALYSIS, MICROSCOPIC
HYALINE CASTS: 12 /LPF — ABNORMAL HIGH (ref <0)
RBCS: 2 /HPF (ref <4)
SQUAMOUS EPITHELIAL: 6 /HPF (ref <28)
WBCS: 4 /HPF (ref <6)

## 2024-02-15 LAB — THYROID STIMULATING HORMONE (SENSITIVE TSH): TSH: 1.339 u[IU]/mL (ref 0.450–5.330)

## 2024-02-15 LAB — VITAMIN D 25 TOTAL: VITAMIN D 25, TOTAL: 47.08 ng/mL (ref 30.00–100.00)

## 2024-02-15 LAB — HGA1C (HEMOGLOBIN A1C WITH EST AVG GLUCOSE): HEMOGLOBIN A1C: 6.4 % — ABNORMAL HIGH (ref 4.0–6.0)

## 2024-02-17 LAB — URINE CULTURE,ROUTINE: URINE CULTURE: NO GROWTH

## 2024-03-07 NOTE — Addendum Note (Signed)
 Addended by: MORGAN RAISIN C on: 03/07/2024 12:35 PM     Modules accepted: Level of Service

## 2024-03-07 NOTE — Addendum Note (Signed)
 Addended by: MORGAN RAISIN C on: 03/07/2024 12:34 PM     Modules accepted: Level of Service

## 2024-04-14 ENCOUNTER — Encounter (INDEPENDENT_AMBULATORY_CARE_PROVIDER_SITE_OTHER): Payer: Self-pay | Admitting: Nephrology

## 2024-05-05 ENCOUNTER — Encounter (INDEPENDENT_AMBULATORY_CARE_PROVIDER_SITE_OTHER): Admitting: Nephrology

## 2024-05-18 ENCOUNTER — Other Ambulatory Visit (INDEPENDENT_AMBULATORY_CARE_PROVIDER_SITE_OTHER): Payer: Self-pay

## 2024-05-19 ENCOUNTER — Other Ambulatory Visit

## 2024-05-19 ENCOUNTER — Other Ambulatory Visit: Payer: Self-pay

## 2024-05-19 ENCOUNTER — Ambulatory Visit (INDEPENDENT_AMBULATORY_CARE_PROVIDER_SITE_OTHER): Payer: Self-pay

## 2024-05-19 DIAGNOSIS — E559 Vitamin D deficiency, unspecified: Secondary | ICD-10-CM

## 2024-05-19 DIAGNOSIS — N1832 Chronic kidney disease, stage 3b: Secondary | ICD-10-CM

## 2024-05-19 LAB — CBC WITH DIFF
BASOPHIL #: 0.1 x10ˆ3/uL (ref 0.00–0.10)
BASOPHIL %: 1 % (ref 0–1)
EOSINOPHIL #: 0.1 x10ˆ3/uL (ref 0.00–0.50)
EOSINOPHIL %: 1 % (ref 1–7)
HCT: 34.8 % (ref 31.2–41.9)
HGB: 11.7 g/dL (ref 10.9–14.3)
LYMPHOCYTE #: 2 x10ˆ3/uL (ref 1.10–3.10)
LYMPHOCYTE %: 35 % (ref 16–46)
MCH: 29.5 pg (ref 24.7–32.8)
MCHC: 33.5 g/dL (ref 32.3–35.6)
MCV: 88.3 fL (ref 75.5–95.3)
MONOCYTE #: 0.6 x10ˆ3/uL (ref 0.20–0.90)
MONOCYTE %: 11 % (ref 4–11)
MPV: 11.3 fL — ABNORMAL HIGH (ref 7.9–10.8)
NEUTROPHIL #: 2.9 x10ˆ3/uL (ref 1.90–8.20)
NEUTROPHIL %: 52 % (ref 43–77)
PLATELETS: 183 x10ˆ3/uL (ref 140–440)
RBC: 3.95 x10ˆ6/uL (ref 3.63–4.92)
RDW: 16.3 % (ref 12.3–17.7)
WBC: 5.7 x10ˆ3/uL (ref 3.8–11.8)

## 2024-05-19 LAB — BASIC METABOLIC PANEL
ANION GAP: 7 mmol/L (ref 4–13)
BUN/CREA RATIO: 10 (ref 6–22)
BUN: 16 mg/dL (ref 7–25)
CALCIUM: 9.5 mg/dL (ref 8.6–10.3)
CHLORIDE: 108 mmol/L — ABNORMAL HIGH (ref 98–107)
CO2 TOTAL: 29 mmol/L (ref 21–31)
CREATININE: 1.55 mg/dL — ABNORMAL HIGH (ref 0.60–1.30)
ESTIMATED GFR: 32 mL/min/1.73mˆ2 — ABNORMAL LOW (ref >59)
GLUCOSE: 135 mg/dL — ABNORMAL HIGH (ref 74–109)
OSMOLALITY, CALCULATED: 290 mosm/kg (ref 270–290)
POTASSIUM: 4.2 mmol/L (ref 3.5–5.1)
SODIUM: 144 mmol/L (ref 136–145)

## 2024-05-19 LAB — PROTEIN/CREATININE RATIO, URINE, RANDOM
CREATININE RANDOM URINE: 22 mg/dL — ABNORMAL LOW (ref 30–125)
PROTEIN RANDOM URINE: 6 mg/dL — ABNORMAL LOW (ref 50–80)
PROTEIN/CREATININE RATIO RANDOM URINE: 0.273 mg/mg (ref 0.000–200.000)

## 2024-05-19 LAB — MICROALBUMIN/CREATININE RATIO, URINE, RANDOM
CREATININE RANDOM URINE: 22 mg/dL — ABNORMAL LOW (ref 30–125)
MICROALBUMIN RANDOM URINE: 1.9 mg/dL
MICROALBUMIN/CREATININE RATIO RANDOM URINE: 86.4 mg/g

## 2024-05-19 LAB — VITAMIN D 25 TOTAL: VITAMIN D 25, TOTAL: 31.63 ng/mL (ref 30.00–100.00)

## 2024-05-19 LAB — PARATHYROID HORMONE (PTH): PTH: 61.9 pg/mL (ref 12.0–88.0)

## 2024-05-19 LAB — MAGNESIUM: MAGNESIUM: 1.7 mg/dL — ABNORMAL LOW (ref 1.9–2.7)

## 2024-05-19 LAB — URIC ACID: URIC ACID: 6.2 mg/dL (ref 2.3–7.6)

## 2024-05-25 ENCOUNTER — Encounter (INDEPENDENT_AMBULATORY_CARE_PROVIDER_SITE_OTHER): Payer: Self-pay

## 2024-05-26 ENCOUNTER — Ambulatory Visit (INDEPENDENT_AMBULATORY_CARE_PROVIDER_SITE_OTHER): Admitting: Nephrology

## 2024-05-26 ENCOUNTER — Encounter (INDEPENDENT_AMBULATORY_CARE_PROVIDER_SITE_OTHER): Payer: Self-pay | Admitting: Nephrology

## 2024-05-26 ENCOUNTER — Other Ambulatory Visit: Payer: Self-pay

## 2024-05-26 VITALS — BP 195/78 | HR 67 | Ht 63.0 in | Wt 156.0 lb

## 2024-05-26 DIAGNOSIS — N271 Small kidney, bilateral: Secondary | ICD-10-CM

## 2024-05-26 DIAGNOSIS — Z7984 Long term (current) use of oral hypoglycemic drugs: Secondary | ICD-10-CM

## 2024-05-26 DIAGNOSIS — N1832 Chronic kidney disease, stage 3b: Secondary | ICD-10-CM

## 2024-05-26 DIAGNOSIS — E559 Vitamin D deficiency, unspecified: Secondary | ICD-10-CM

## 2024-05-26 DIAGNOSIS — E1122 Type 2 diabetes mellitus with diabetic chronic kidney disease: Secondary | ICD-10-CM

## 2024-05-26 DIAGNOSIS — I129 Hypertensive chronic kidney disease with stage 1 through stage 4 chronic kidney disease, or unspecified chronic kidney disease: Secondary | ICD-10-CM

## 2024-05-26 MED ORDER — FUROSEMIDE 40 MG TABLET
40.0000 mg | ORAL_TABLET | ORAL | 3 refills | Status: AC
Start: 2024-05-27 — End: 2025-05-27

## 2024-05-26 MED ORDER — MAGNESIUM OXIDE 400 MG (241.3 MG MAGNESIUM) TABLET
400.0000 mg | ORAL_TABLET | Freq: Two times a day (BID) | ORAL | 3 refills | Status: AC
Start: 2024-05-26 — End: 2025-05-26

## 2024-05-26 NOTE — Progress Notes (Addendum)
 NEPHROLOGY, [facility]  [address]  [place] [address]    Progress Note    Name: Connie Barber  Connie Barber MRN:  [id number]   Date: 05/26/2024 DOB:  05/11/1935 (89 y.o.)              Nephrology Out  Patient Visit       HPI : 89 y.o.  female her for a follow up chronic kidney disease stage IIIb: Creatinine 1.58 GFR 32.    Patient is feeling well reports edema bilateral lower extremities.  Discussed with her adding Lasix .  Her blood pressure has been elevated.  She is currently on Cozaar  and amlodipine  for blood pressure management.      Past medical history chronic kidney disease, non-insulin -dependent diabetes mellitus type II, hypothyroidism,  hypertension, and gout.       ROS:     Assessment  was negative except what mentioned in in the HPI.     OBJECTIVE:   BP (!) 195/78   Pulse 67   Ht 1.6 m (5' 3)   Wt 70.8 kg (156 lb)   BMI 27.63 kg/m       General:  NAD, AAOx3  HEENT:  EOMI,  no icterus  NECK: No increased JVD.  Normal inspection   HEART:  Regular rhythm. No murmurs or rubs. No chest wall tenderness   LUNGS: Clear to auscultation bilateral. No wheezes, rales, or rhonchi.   ABDOMEN: +BS, Soft, nontender and nondistended. No rebound or guarding present.   EXTREMITIES:  Plus two edema lower extremities    NEURO : Normal speech, EOMI.       LABORATORY DATA:   Lab Results   Component Value Date    BUN 16 05/19/2024    BUN 42 (H) 02/15/2024    BUN 34 (H) 01/26/2024    CREATININE 1.55 (H) 05/19/2024    CREATININE 2.43 (H) 02/15/2024    CREATININE 1.84 (H) 01/26/2024    BUNCRRATIO 10 05/19/2024    BUNCRRATIO 17 02/15/2024    BUNCRRATIO 18 01/26/2024    GFR 32 (L) 05/19/2024    GFR 19 (L) 02/15/2024    GFR 26 (L) 01/26/2024    SODIUM 144 05/19/2024    SODIUM 144 02/15/2024    SODIUM 140 01/26/2024    POTASSIUM 4.2 05/19/2024    POTASSIUM 4.0 02/15/2024    POTASSIUM 4.8 01/26/2024    CHLORIDE 108 (H) 05/19/2024    CHLORIDE 104 02/15/2024    CHLORIDE 104 01/26/2024    CO2 29 05/19/2024    CO2  29 02/15/2024    CO2 27 01/26/2024    ANIONGAP 7 05/19/2024    ANIONGAP 11 02/15/2024    ANIONGAP 9 01/26/2024    CALCIUM 9.5 05/19/2024    CALCIUM 10.1 02/15/2024    CALCIUM 9.2 01/26/2024    PHOSPHORUS 3.8 12/31/2022    PHOSPHORUS 3.6 (L) 07/15/2022    PHOSPHORUS 4.4 02/10/2022    ALBUMIN  4.1 02/15/2024    ALBUMIN  3.8 01/26/2024    ALBUMIN  4.1 01/25/2024    HGB 11.7 05/19/2024    HGB 13.0 02/15/2024    HGB 12.4 01/26/2024    HCT 34.8 05/19/2024    HCT 39.2 02/15/2024    HCT 37.4 01/26/2024    INTACTPTH 61.9 05/19/2024    INTACTPTH 115.0 (H) 11/16/2023    INTACTPTH 143.7 (H) 08/11/2023    IRON 64 12/31/2022    IRON 84 07/15/2022    IRON 81 02/10/2022    IRONBINDCAP 314 12/31/2022  IRONBINDCAP 283 07/15/2022    IRONBINDCAP 336 02/10/2022    IRONSAT 20 12/31/2022    IRONSAT 30 07/15/2022    IRONSAT 24 02/10/2022    FERRITIN 69 02/10/2022           MEDICATIONS:  No outpatient medications have been marked as taking for the 05/26/24 encounter (Office Visit) with Helene Meter, MD.     Current Outpatient Medications   Medication Instructions    amLODIPine  (NORVASC ) 10 mg, Oral, Daily    aspirin  (ECOTRIN) 81 mg, Daily    Colchicine -Probenecid  500-0.5 mg Oral Tablet 1 Tablet, EVERY EVENING    cyanocobalamin  (VITAMIN B 12) 1,000 mcg, Daily    ergocalciferol  (vitamin D2) (DRISDOL) 50,000 Units, EVERY 7 DAYS    esomeprazole magnesium  (NEXIUM) 40 mg, DAILY PRN    ezetimibe  (ZETIA ) 10 mg, Oral, EVERY MORNING    levothyroxine  (SYNTHROID ) 75 mcg, EVERY MORNING    losartan  (COZAAR ) 50 mg, Oral, Daily    SITagliptin phosphate (JANUVIA) 50 mg, Oral, Daily      ASSESSMENT / PLAN:  ENCOUNTER DIAGNOSES     ICD-10-CM   1. Stage 3b chronic kidney disease (CMS HCC)  N18.32   2. Vitamin D  deficiency  E55.9           Chronic kidney disease  -Stage IIIb  -Due to diabetes and hypertension  -Creatinine is 1.58 GFR 32   -Baseline creatinine : 1.6   -Total protein to creatinine ratio- 0.35  Lab Results   Component Value Date    UPCR 0.273  05/19/2024    UPCR 0.347 11/16/2023      -UA CR 86  -CBC and a basic metabolic panel  -Return to clinic in 3 months  -Continue low-sodium diet  -balanced Fluid intake 45 oz a day.    -Avoid NSAIDs.  Tylenol  only for pain  -Renal imaging: No hydronephrosis both kidneys are small in size  -olmesartan  40 mg daily  -Sodium Glucose Cotransporter-2 (SGLT2) Inhibitors:   -not on Jardiance  or Farxiga  -add Lasix  40 on Mondays Wednesdays and Fridays if not better we will add Jardiance  daily          CKD bone mineral disease  Lab Results   Component Value Date    INTACTPTH 61.9 05/19/2024    VITAMIND25 31.63 05/19/2024    PHOSPHORUS 3.8 12/31/2022    -at goal            Hypertension  -Blood pressure is elevated  -Goal less than 130/80  -losartan  50  -amlodipine  10 mg daily  -add Lasix  40 on Mondays Wednesdays and Fridays  -hydrochlorothiazide 12.5 mg PRN, patient is not taking daily  -Low-salt diet        Diabetes type 2  -A1c goal is less than 7   Lab Results   Component Value Date    HA1C 6.4 (H) 02/15/2024      -Continue Jardiance  10 mg daily   -glimepiride 1 mg daily   -Diabetic diet  -Increase activity and exercise as possible  -Monitor A1C      Hypomagnesemia  -magnesium  400 mg daily  -monitor magnesium  level              Orders Placed This Encounter    BASIC METABOLIC PANEL    CBC/DIFF    MAGNESIUM     MICROALBUMIN/CREATININE RATIO, URINE, RANDOM    PARATHYROID  HORMONE (PTH)    PROTEIN/CREATININE RATIO, URINE, RANDOM    URIC ACID    VITAMIN D  25 TOTAL

## 2024-10-10 ENCOUNTER — Other Ambulatory Visit (INDEPENDENT_AMBULATORY_CARE_PROVIDER_SITE_OTHER): Payer: Self-pay

## 2024-10-17 ENCOUNTER — Encounter (INDEPENDENT_AMBULATORY_CARE_PROVIDER_SITE_OTHER): Payer: Self-pay | Admitting: Nephrology

## 2024-10-27 ENCOUNTER — Ambulatory Visit (INDEPENDENT_AMBULATORY_CARE_PROVIDER_SITE_OTHER): Payer: Self-pay | Admitting: INTERVENTIONAL CARDIOLOGY
